# Patient Record
Sex: Female | Born: 1935
Health system: Southern US, Community
[De-identification: ages and names within clinical notes are randomized; demographics above are authoritative.]

## PROBLEM LIST (undated history)

## (undated) DIAGNOSIS — C50919 Malignant neoplasm of unspecified site of unspecified female breast: Secondary | ICD-10-CM

## (undated) DIAGNOSIS — K5730 Diverticulosis of large intestine without perforation or abscess without bleeding: Secondary | ICD-10-CM

## (undated) DIAGNOSIS — F419 Anxiety disorder, unspecified: Secondary | ICD-10-CM

## (undated) DIAGNOSIS — R112 Nausea with vomiting, unspecified: Secondary | ICD-10-CM

## (undated) DIAGNOSIS — B029 Zoster without complications: Secondary | ICD-10-CM

## (undated) DIAGNOSIS — I809 Phlebitis and thrombophlebitis of unspecified site: Secondary | ICD-10-CM

## (undated) DIAGNOSIS — E079 Disorder of thyroid, unspecified: Secondary | ICD-10-CM

## (undated) DIAGNOSIS — Z9889 Other specified postprocedural states: Secondary | ICD-10-CM

## (undated) DIAGNOSIS — M8580 Other specified disorders of bone density and structure, unspecified site: Secondary | ICD-10-CM

## (undated) DIAGNOSIS — G4700 Insomnia, unspecified: Secondary | ICD-10-CM

## (undated) DIAGNOSIS — C50111 Malignant neoplasm of central portion of right female breast: Secondary | ICD-10-CM

## (undated) DIAGNOSIS — K589 Irritable bowel syndrome without diarrhea: Secondary | ICD-10-CM

## (undated) DIAGNOSIS — E559 Vitamin D deficiency, unspecified: Secondary | ICD-10-CM

## (undated) DIAGNOSIS — C679 Malignant neoplasm of bladder, unspecified: Secondary | ICD-10-CM

## (undated) DIAGNOSIS — A4901 Methicillin susceptible Staphylococcus aureus infection, unspecified site: Secondary | ICD-10-CM

## (undated) DIAGNOSIS — M1990 Unspecified osteoarthritis, unspecified site: Secondary | ICD-10-CM

## (undated) DIAGNOSIS — I2510 Atherosclerotic heart disease of native coronary artery without angina pectoris: Secondary | ICD-10-CM

## (undated) DIAGNOSIS — E785 Hyperlipidemia, unspecified: Secondary | ICD-10-CM

## (undated) DIAGNOSIS — K644 Residual hemorrhoidal skin tags: Secondary | ICD-10-CM

## (undated) DIAGNOSIS — K56699 Other intestinal obstruction unspecified as to partial versus complete obstruction: Secondary | ICD-10-CM

## (undated) DIAGNOSIS — C44721 Squamous cell carcinoma of skin of unspecified lower limb, including hip: Secondary | ICD-10-CM

## (undated) DIAGNOSIS — K5900 Constipation, unspecified: Secondary | ICD-10-CM

## (undated) HISTORY — DX: Insomnia, unspecified: G47.00

## (undated) HISTORY — DX: Phlebitis and thrombophlebitis of unspecified site: I80.9

## (undated) HISTORY — DX: Diverticulosis of large intestine without perforation or abscess without bleeding: K57.30

## (undated) HISTORY — DX: Irritable bowel syndrome, unspecified: K58.9

## (undated) HISTORY — PX: TONSILLECTOMY: SUR1361

## (undated) HISTORY — DX: Malignant neoplasm of central portion of right female breast: C50.111

## (undated) HISTORY — DX: Other specified disorders of bone density and structure, unspecified site: M85.80

## (undated) HISTORY — PX: KNEE SURGERY: SHX244

## (undated) HISTORY — DX: Zoster without complications: B02.9

## (undated) HISTORY — PX: KNEE ARTHROSCOPY: SUR90

## (undated) HISTORY — DX: Vitamin D deficiency, unspecified: E55.9

## (undated) HISTORY — DX: Squamous cell carcinoma of skin of unspecified lower limb, including hip: C44.721

## (undated) HISTORY — DX: Methicillin susceptible Staphylococcus aureus infection, unspecified site: A49.01

## (undated) HISTORY — DX: Hyperlipidemia, unspecified: E78.5

## (undated) HISTORY — PX: OTHER SURGICAL HISTORY: SHX169

## (undated) HISTORY — DX: Other intestinal obstruction unspecified as to partial versus complete obstruction: K56.699

## (undated) HISTORY — DX: Malignant neoplasm of bladder, unspecified: C67.9

## (undated) HISTORY — DX: Residual hemorrhoidal skin tags: K64.4

## (undated) HISTORY — PX: HEMORRHOID SURGERY: SHX153

## (undated) HISTORY — DX: Anxiety disorder, unspecified: F41.9

## (undated) HISTORY — DX: Constipation, unspecified: K59.00

## (undated) HISTORY — DX: Disorder of thyroid, unspecified: E07.9

## (undated) HISTORY — DX: Malignant neoplasm of unspecified site of unspecified female breast: C50.919

## (undated) HISTORY — DX: Unspecified osteoarthritis, unspecified site: M19.90

---

## 1985-12-11 ENCOUNTER — Encounter: Payer: Self-pay | Admitting: Gastroenterology

## 1997-12-13 ENCOUNTER — Other Ambulatory Visit: Admission: RE | Admit: 1997-12-13 | Discharge: 1997-12-13 | Payer: Self-pay | Admitting: Obstetrics and Gynecology

## 1998-03-21 ENCOUNTER — Other Ambulatory Visit: Admission: RE | Admit: 1998-03-21 | Discharge: 1998-03-21 | Payer: Self-pay | Admitting: Dermatology

## 1998-04-01 ENCOUNTER — Other Ambulatory Visit: Admission: RE | Admit: 1998-04-01 | Discharge: 1998-04-01 | Payer: Self-pay | Admitting: Dermatology

## 1998-12-21 ENCOUNTER — Other Ambulatory Visit: Admission: RE | Admit: 1998-12-21 | Discharge: 1998-12-21 | Payer: Self-pay | Admitting: Obstetrics and Gynecology

## 1999-12-25 ENCOUNTER — Other Ambulatory Visit: Admission: RE | Admit: 1999-12-25 | Discharge: 1999-12-25 | Payer: Self-pay | Admitting: Obstetrics and Gynecology

## 2000-06-10 HISTORY — PX: TOTAL KNEE ARTHROPLASTY: SHX125

## 2000-06-12 ENCOUNTER — Encounter: Payer: Self-pay | Admitting: Orthopedic Surgery

## 2000-06-19 ENCOUNTER — Inpatient Hospital Stay (HOSPITAL_COMMUNITY): Admission: RE | Admit: 2000-06-19 | Discharge: 2000-06-24 | Payer: Self-pay | Admitting: Orthopedic Surgery

## 2000-06-19 ENCOUNTER — Encounter: Payer: Self-pay | Admitting: Orthopedic Surgery

## 2001-01-22 ENCOUNTER — Other Ambulatory Visit: Admission: RE | Admit: 2001-01-22 | Discharge: 2001-01-22 | Payer: Self-pay | Admitting: Obstetrics and Gynecology

## 2002-01-28 ENCOUNTER — Other Ambulatory Visit: Admission: RE | Admit: 2002-01-28 | Discharge: 2002-01-28 | Payer: Self-pay | Admitting: Obstetrics and Gynecology

## 2002-12-31 ENCOUNTER — Encounter: Admission: RE | Admit: 2002-12-31 | Discharge: 2002-12-31 | Payer: Self-pay | Admitting: *Deleted

## 2002-12-31 ENCOUNTER — Encounter: Payer: Self-pay | Admitting: *Deleted

## 2003-02-16 ENCOUNTER — Other Ambulatory Visit: Admission: RE | Admit: 2003-02-16 | Discharge: 2003-02-16 | Payer: Self-pay | Admitting: Obstetrics and Gynecology

## 2004-02-21 ENCOUNTER — Other Ambulatory Visit: Admission: RE | Admit: 2004-02-21 | Discharge: 2004-02-21 | Payer: Self-pay | Admitting: Obstetrics and Gynecology

## 2004-06-20 ENCOUNTER — Inpatient Hospital Stay (HOSPITAL_COMMUNITY): Admission: RE | Admit: 2004-06-20 | Discharge: 2004-06-23 | Payer: Self-pay | Admitting: Orthopedic Surgery

## 2005-04-02 ENCOUNTER — Other Ambulatory Visit: Admission: RE | Admit: 2005-04-02 | Discharge: 2005-04-02 | Payer: Self-pay | Admitting: Obstetrics and Gynecology

## 2006-04-30 ENCOUNTER — Other Ambulatory Visit: Admission: RE | Admit: 2006-04-30 | Discharge: 2006-04-30 | Payer: Self-pay | Admitting: Obstetrics and Gynecology

## 2006-08-07 ENCOUNTER — Ambulatory Visit: Payer: Self-pay | Admitting: Gastroenterology

## 2006-09-26 ENCOUNTER — Ambulatory Visit: Payer: Self-pay | Admitting: Gastroenterology

## 2007-05-22 ENCOUNTER — Encounter: Admission: RE | Admit: 2007-05-22 | Discharge: 2007-05-22 | Payer: Self-pay | Admitting: Internal Medicine

## 2007-08-05 ENCOUNTER — Other Ambulatory Visit: Admission: RE | Admit: 2007-08-05 | Discharge: 2007-08-05 | Payer: Self-pay | Admitting: Obstetrics and Gynecology

## 2008-07-27 ENCOUNTER — Ambulatory Visit: Payer: Self-pay | Admitting: Gastroenterology

## 2008-07-27 DIAGNOSIS — Z860100 Personal history of colon polyps, unspecified: Secondary | ICD-10-CM | POA: Insufficient documentation

## 2008-07-27 DIAGNOSIS — K589 Irritable bowel syndrome without diarrhea: Secondary | ICD-10-CM | POA: Insufficient documentation

## 2008-07-27 LAB — CONVERTED CEMR LAB
BUN: 21 mg/dL (ref 6–23)
Basophils Absolute: 0 10*3/uL (ref 0.0–0.1)
Basophils Relative: 0.6 % (ref 0.0–3.0)
Calcium: 8.8 mg/dL (ref 8.4–10.5)
Chloride: 107 meq/L (ref 96–112)
Creatinine, Ser: 0.8 mg/dL (ref 0.4–1.2)
Eosinophils Absolute: 0.2 10*3/uL (ref 0.0–0.7)
Eosinophils Relative: 2.3 % (ref 0.0–5.0)
GFR calc Af Amer: 91 mL/min
GFR calc non Af Amer: 75 mL/min
HCT: 38.1 % (ref 36.0–46.0)
MCHC: 34.8 g/dL (ref 30.0–36.0)
MCV: 95.7 fL (ref 78.0–100.0)
Monocytes Absolute: 0.6 10*3/uL (ref 0.1–1.0)
Neutrophils Relative %: 61 % (ref 43.0–77.0)
RBC: 3.98 M/uL (ref 3.87–5.11)
TSH: 1.52 microintl units/mL (ref 0.35–5.50)
WBC: 6.9 10*3/uL (ref 4.5–10.5)

## 2008-08-24 ENCOUNTER — Ambulatory Visit: Payer: Self-pay | Admitting: Gastroenterology

## 2008-10-11 DIAGNOSIS — I2510 Atherosclerotic heart disease of native coronary artery without angina pectoris: Secondary | ICD-10-CM

## 2008-10-11 HISTORY — DX: Atherosclerotic heart disease of native coronary artery without angina pectoris: I25.10

## 2008-10-11 HISTORY — PX: CORONARY ANGIOPLASTY WITH STENT PLACEMENT: SHX49

## 2008-10-13 ENCOUNTER — Inpatient Hospital Stay (HOSPITAL_COMMUNITY): Admission: AD | Admit: 2008-10-13 | Discharge: 2008-10-14 | Payer: Self-pay | Admitting: Interventional Cardiology

## 2008-10-13 ENCOUNTER — Inpatient Hospital Stay (HOSPITAL_BASED_OUTPATIENT_CLINIC_OR_DEPARTMENT_OTHER): Admission: RE | Admit: 2008-10-13 | Discharge: 2008-10-13 | Payer: Self-pay | Admitting: Cardiology

## 2009-09-10 DIAGNOSIS — A4901 Methicillin susceptible Staphylococcus aureus infection, unspecified site: Secondary | ICD-10-CM

## 2009-09-10 HISTORY — DX: Methicillin susceptible Staphylococcus aureus infection, unspecified site: A49.01

## 2010-03-22 ENCOUNTER — Telehealth: Payer: Self-pay | Admitting: Gastroenterology

## 2010-06-23 ENCOUNTER — Encounter: Admission: RE | Admit: 2010-06-23 | Discharge: 2010-06-23 | Payer: Self-pay | Admitting: Internal Medicine

## 2010-09-10 DIAGNOSIS — C679 Malignant neoplasm of bladder, unspecified: Secondary | ICD-10-CM

## 2010-09-10 DIAGNOSIS — C44721 Squamous cell carcinoma of skin of unspecified lower limb, including hip: Secondary | ICD-10-CM

## 2010-09-10 HISTORY — DX: Malignant neoplasm of bladder, unspecified: C67.9

## 2010-09-10 HISTORY — PX: OTHER SURGICAL HISTORY: SHX169

## 2010-09-10 HISTORY — DX: Squamous cell carcinoma of skin of unspecified lower limb, including hip: C44.721

## 2010-10-10 NOTE — Progress Notes (Signed)
Summary: Schedule Office Visit  Phone Note Outgoing Call Call back at [facility] Phone [phone]   Call placed by: Harlow Mares CMA Duncan Dull),  March 22, 2010 4:53 PM Call placed to: Patient Summary of Call: No Answer, patient is due for an office visit  Initial call taken by: Harlow Mares CMA Duncan Dull),  March 22, 2010 4:54 PM  Follow-up for Phone Call        patient is doing great and will call back if needed.  Follow-up by: Harlow Mares CMA Duncan Dull),  March 29, 2010 4:47 PM

## 2010-10-19 ENCOUNTER — Other Ambulatory Visit: Payer: Self-pay | Admitting: Dermatology

## 2010-10-21 ENCOUNTER — Emergency Department (HOSPITAL_COMMUNITY)
Admission: EM | Admit: 2010-10-21 | Discharge: 2010-10-21 | Disposition: A | Discharge status: Home / Self Care | Payer: Medicare Other | Attending: Emergency Medicine | Admitting: Emergency Medicine

## 2010-10-21 DIAGNOSIS — I2510 Atherosclerotic heart disease of native coronary artery without angina pectoris: Secondary | ICD-10-CM | POA: Insufficient documentation

## 2010-10-21 DIAGNOSIS — R0609 Other forms of dyspnea: Secondary | ICD-10-CM | POA: Insufficient documentation

## 2010-10-21 DIAGNOSIS — E039 Hypothyroidism, unspecified: Secondary | ICD-10-CM | POA: Insufficient documentation

## 2010-10-21 DIAGNOSIS — R0602 Shortness of breath: Secondary | ICD-10-CM

## 2010-10-21 DIAGNOSIS — Z79899 Other long term (current) drug therapy: Secondary | ICD-10-CM | POA: Insufficient documentation

## 2010-10-21 DIAGNOSIS — R0989 Other specified symptoms and signs involving the circulatory and respiratory systems: Secondary | ICD-10-CM | POA: Insufficient documentation

## 2010-10-21 DIAGNOSIS — R079 Chest pain, unspecified: Secondary | ICD-10-CM | POA: Insufficient documentation

## 2010-10-21 LAB — COMPREHENSIVE METABOLIC PANEL
AST: 25 U/L (ref 0–37)
CO2: 25 mEq/L (ref 19–32)
Calcium: 9 mg/dL (ref 8.4–10.5)
Chloride: 104 mEq/L (ref 96–112)
Creatinine, Ser: 0.69 mg/dL (ref 0.4–1.2)
GFR calc Af Amer: >60 mL/min (ref >60)
GFR calc non Af Amer: >60 mL/min (ref >60)
Glucose, Bld: 99 mg/dL (ref 70–99)
Total Bilirubin: 0.6 mg/dL (ref 0.3–1.2)

## 2010-10-21 LAB — POCT CARDIAC MARKERS: Myoglobin, poc: 58 ng/mL (ref 12–200)

## 2010-10-21 LAB — CBC
Hemoglobin: 13.4 g/dL (ref 12.0–15.0)
MCH: 32.4 pg (ref 26.0–34.0)
MCHC: 34.3 g/dL (ref 30.0–36.0)
Platelets: 190 10*3/uL (ref 150–400)
RDW: 12.8 % (ref 11.5–15.5)

## 2010-11-20 ENCOUNTER — Other Ambulatory Visit: Payer: Self-pay | Admitting: Urology

## 2010-11-20 ENCOUNTER — Ambulatory Visit (HOSPITAL_BASED_OUTPATIENT_CLINIC_OR_DEPARTMENT_OTHER)
Admission: RE | Admit: 2010-11-20 | Discharge: 2010-11-21 | Disposition: A | Discharge status: Home / Self Care | Payer: Medicare Other | Source: Ambulatory Visit | Attending: Urology | Admitting: Urology

## 2010-11-20 ENCOUNTER — Ambulatory Visit (HOSPITAL_COMMUNITY): Payer: Medicare Other | Attending: Urology

## 2010-11-20 DIAGNOSIS — I2510 Atherosclerotic heart disease of native coronary artery without angina pectoris: Secondary | ICD-10-CM | POA: Insufficient documentation

## 2010-11-20 DIAGNOSIS — Z01818 Encounter for other preprocedural examination: Secondary | ICD-10-CM | POA: Insufficient documentation

## 2010-11-20 DIAGNOSIS — Q619 Cystic kidney disease, unspecified: Secondary | ICD-10-CM | POA: Insufficient documentation

## 2010-11-20 DIAGNOSIS — D494 Neoplasm of unspecified behavior of bladder: Secondary | ICD-10-CM | POA: Insufficient documentation

## 2010-11-20 DIAGNOSIS — Z79899 Other long term (current) drug therapy: Secondary | ICD-10-CM | POA: Insufficient documentation

## 2010-11-20 DIAGNOSIS — Z7902 Long term (current) use of antithrombotics/antiplatelets: Secondary | ICD-10-CM | POA: Insufficient documentation

## 2010-11-20 DIAGNOSIS — I359 Nonrheumatic aortic valve disorder, unspecified: Secondary | ICD-10-CM | POA: Insufficient documentation

## 2010-11-20 DIAGNOSIS — Z87891 Personal history of nicotine dependence: Secondary | ICD-10-CM | POA: Insufficient documentation

## 2010-11-20 DIAGNOSIS — E039 Hypothyroidism, unspecified: Secondary | ICD-10-CM | POA: Insufficient documentation

## 2010-11-20 DIAGNOSIS — N35919 Unspecified urethral stricture, male, unspecified site: Secondary | ICD-10-CM | POA: Insufficient documentation

## 2010-11-20 DIAGNOSIS — E785 Hyperlipidemia, unspecified: Secondary | ICD-10-CM | POA: Insufficient documentation

## 2010-11-20 LAB — POCT I-STAT 4, (NA,K, GLUC, HGB,HCT)
Glucose, Bld: 106 mg/dL — ABNORMAL HIGH (ref 70–99)
HCT: 40 % (ref 36.0–46.0)
Potassium: 4.2 mEq/L (ref 3.5–5.1)

## 2010-11-22 NOTE — Op Note (Signed)
NAMENATTIE, LAZENBY NO.:  [id number]  MEDICAL RECORD NO.:  [id number]           PATIENT TYPE:  E  LOCATION:  [facility]                         FACILITY:  [facility]  PHYSICIAN:  Bertram Millard. Shaely Gadberry, M.D.DATE OF BIRTH:  [birth year]  DATE OF PROCEDURE:  11/20/2010 DATE OF DISCHARGE:  10/21/2010                              OPERATIVE REPORT   PREOPERATIVE DIAGNOSIS:  A 2.8-cm bladder tumor.  POSTOPERATIVE DIAGNOSES:  A 2.8-cm bladder tumor.  SURGICAL PROCEDURES:  Transurethral resection of the bladder tumor, 2.8- cm bladder tumor.  SURGEON:  Bertram Millard. Falyn Rubel, M.D.  ANESTHESIA:  General with LMA.  COMPLICATIONS:  None.  SPECIMENS: 1. Bladder tumor. 2. Bladder tumor base.  BRIEF HISTORY:  This very nice 76 year old female presents at this time for definitive surgical management of bladder tumor.  She presented on September 25, 2010, with gross hematuria.  This was fairly self limited. She does have a remote history of cigarette smoking.  She was also on anticoagulation.  Evaluation included CT of the abdomen and pelvis and cystoscopy.  CT revealed a 2.8-cm bladder tumor as well as bilateral renal cysts and a 4.8-cm left upper pole lesion which was a Bosniak category 79F cyst (in other words, we need to follow this on a regular basis with imaging).  The patient presents at this time for TURBT after cystoscopy confirmed bladder tumor.  Risks and complications of the procedure have been discussed with the patient and her husband.  They understand these and desire to proceed.  DESCRIPTION OF PROCEDURE:  The patient was identified in the holding area and received preoperative IV antibiotics.  She was taken to the operating room where general anesthetic was administered using the LMA. She was placed in the dorsal lithotomy position.  Genitalia and perineum were prepped and draped.  Time-out was then performed.  The procedure then commenced.  A 28-French  resectoscope sheath was passed into the bladder with the obturator.  There was mild meatal stenosis.  However, the scope easily passed.  The resectoscope element was then placed.  Thorough evaluation of the bladder was then performed. She had 2 small satellite lesions near one large bladder tumor that was lateral and superior to the right ureteral orifice.  The orifice was not involved in any of this process.  I then used the cutting loop to resect the large bladder tumor down to its base.  The muscular tissue was obtained with this, according to my estimate.  Once all the bladder tumor had been resected down to muscular layer, I then used the cold cup forceps to take three separate biopsies of the base of this.  These were sent separate from the main body of the bladder tumor, labeled "bladder tumor base."  Following this, the entire base of the resected area was cauterized.  I then included the main bladder tumor, the 2 satellite lesions which were resected separately.  Again, the base of these areas was cauterized.  No bleeding was seen at this point and the irrigant which came out of the bladder was clear.  I then placed a 20-French Foley catheter.  The specimens were sent separately as "bladder tumor" and "bladder tumor base."  At this point, the patient was awakened and taken to PACU in stable condition after the catheter had been placed and her bladder drained.  I then, separately in the PACU, instilled 40 cc of saline which included 40 mg of mitomycin.  The catheter was plugged.  At this point, the patient was awake.     Bertram Millard. Brysyn Brandenberger, M.D.     SMD/MEDQ  D:  11/20/2010  T:  11/20/2010  Job:  [id number]  cc:   [name], M.D. Fax: [phone]  Armanda Magic, M.D. Fax: [phone]  Electronically Signed by Marcine Matar M.D. on 11/22/2010 10:31:59 AM

## 2010-12-09 NOTE — Consult Note (Signed)
[name], [name] NO.:  [id number]  MEDICAL RECORD NO.:  [id number]           PATIENT TYPE:  E  LOCATION:  [facility]                         FACILITY:  [facility]  PHYSICIAN:  Sarajane Marek, MD     DATE OF BIRTH:  1935-09-06  DATE OF CONSULTATION:  10/21/2010 DATE OF DISCHARGE:  10/21/2010                                CONSULTATION   PRIMARY CARDIOLOGIST:  Armanda Magic, MD  PRIMARY PHYSICIAN:  [name], MD  REQUESTING PHYSICIAN:  Emergency department.  REASON FOR CONSULTATION:  Shortness of breath.  HISTORY OF PRESENT ILLNESS:  [name] is a 76 year old female with history of coronary artery disease, status post PCI of the LAD in February 2010, who presents with shortness of breath and lightheadedness.  She reports that she has not had any exertional angina symptoms, heart failure symptoms, presyncope, syncope, shortness of breath or lightheadedness over the last 3-4 weeks.  She has remained quite active with traveling and has not had any symptoms to speak of. I also questioned her about prolonged periods of sitting during her travels and she reports that they do stop frequently to walk around and she has not had any lower extremity edema or pain.  Today, she noticed symptoms of shortness of breath and lightheadedness in association with significantly elevated blood pressure.  She notes that her blood pressure is normally in the 120s-130s/80s and she was unable to identify precipitating factors for elevated blood pressure.  In the emergency department, she did received sublingual nitroglycerin x3, and her initial blood pressure of 185/91 has now improved to 129/77.  All symptoms have resolved and the patient has discussed her desire to go home and follow up with Dr. Mayford Knife on Monday.  PAST MEDICAL HISTORY.: 1. Coronary artery disease, status post PCI of the LAD with a 3.5 x 15     mm Endeavor stent in February 2010. 2. Hypothyroid. 3. Anxiety. 4.  Insomnia. 5. IBS. 6. Vitamin D insufficiency.  ALLERGIES: 1. DROPERIDOL. 2. SULFA. 3. BENADRYL. 4. CORTISONE. 5. CRESTOR. 6. LIPITOR. 7. TRAMADOL.  CURRENT MEDICATIONS: 1. Folic acid 400 mcg daily. 2. Calcium 600 +D 400 international units tablet 2 tablets daily. 3. Citrucel powder as needed. 4. MiraLAX powder as needed. 5. Centrum cardio 1 tablet daily. 6. Ambien 5 mg nightly  p.r.n. 7. Plavix 75 mg daily.  Of note, the patient has discontinued x3 days. 8. Aspirin 81 mg daily. 9. Synthroid 50 mcg daily. 10.CoQ-10, 200 mg daily. 11.Pravastatin 40 mg daily. 12.Xanax 0.5 mg tablet one-half as needed.  SOCIAL HISTORY:  The patient lives with her husband in [place], has a 25-pack-year smoking history, but quit in 1986.  She drinks one glass of wine daily.  FAMILY HISTORY:  Significant for father deceased with MI at age 38.  REVIEW OF SYSTEMS:  Complete 14- point review of systems was performed and was negative except as noted in HPI.  PHYSICAL EXAMINATION:  VITAL SIGNS:  Temperature 98.3, heart rate 82, respiratory rate 20, blood pressure initially 185/91 and now 129/77, oxygen saturation is 99% on room air. GENERAL:  The patient is  in no acute distress. EYES:  Extraocular muscles intact.  Sclerae are clear. ENT:  Oropharynx is clear.  Nares are clear.  Mucous membranes are moist. NECK:  Supple without lymphadenopathy.  There is no thyromegaly, bruit, or JVD. CARDIOVASCULAR:  Heart:  Rate is regular with normal S1-S2.  No S3, S4. No murmur, gallop, or rub. LUNGS:  Clear bilaterally without increased work of breathing or tachypnea. ABDOMEN:  Soft and nontender with normal bowel sounds.  No rebound, guarding, or mass. EXTREMITIES:  No clubbing, cyanosis, or edema. MUSCULOSKELETAL:  She has arthritic changes of her hands, but otherwise no significant joint deformity. NEURO:  The patient is alert and oriented x3 with no focal deficits.  Chest x-ray was performed at  the office and unavailable for review at this time.  EKG demonstrates normal sinus rhythm at a rate of 72 with no ischemic ST or T changes and no change from prior.  LABORATORY DATA:  White blood cell count 6.9, hemoglobin 13.4, hematocrit 39, platelets 190.  Sodium 138, potassium 3.8, chloride 104, bicarb 25, BUN 22, creatinine 0.7, glucose 99, CK-MB is 2, troponin is less than 0.05.  LFTs are within normal limits.  ASSESSMENT: 1. Hypertensive urgency. 2. Dyspnea. 3. History of coronary artery disease.  PLAN:  The patient's blood pressure is now controlled with total resolution of symptoms.  She was unable to identify precipitating factors.  I did discuss an overnight observation in the hospital with serial EKGs and telemetry monitor as well as serial cardiac biomarkers,a check of her TSH and potentially a renal artery duplex to evaluate her hypertension.  I have also discussed the potential for low-dose hydrochlorothiazide as her last office blood pressure check was elevated to 148/80, however, the patient would prefer to discuss this further with Dr. Mayford Knife.  She prefers to be discharged home which we will do tonight and she will follow up with Dr. Mayford Knife on Monday.  She is advised to return to the emergency department with any chest pain, shortness of breath, presyncope, syncope, or nausea and vomiting.     Sarajane Marek, MD     HH/MEDQ  D:  10/21/2010  T:  10/22/2010  Job:  [id number]  Electronically Signed by Sarajane Marek MD on 12/09/2010 02:46:48 PM

## 2010-12-26 LAB — CBC
MCV: 94.8 fL (ref 78.0–100.0)
Platelets: 207 10*3/uL (ref 150–400)
RBC: 4.13 MIL/uL (ref 3.87–5.11)
WBC: 6.3 10*3/uL (ref 4.0–10.5)

## 2010-12-26 LAB — BASIC METABOLIC PANEL
CO2: 29 mEq/L (ref 19–32)
Calcium: 9.2 mg/dL (ref 8.4–10.5)
Chloride: 109 mEq/L (ref 96–112)
Creatinine, Ser: 0.71 mg/dL (ref 0.4–1.2)
GFR calc Af Amer: >60 mL/min (ref >60)
Sodium: 143 mEq/L (ref 135–145)

## 2011-01-23 NOTE — Cardiovascular Report (Signed)
[name], PFLUM NO.:  [id number]   MEDICAL RECORD NO.:  [id number]          PATIENT TYPE:  INP   LOCATION:  2507                         FACILITY:  [facility]   PHYSICIAN:  Corky Crafts, MDDATE OF BIRTH:  [birth year]   DATE OF PROCEDURE:  10/13/2008  DATE OF DISCHARGE:                            CARDIAC CATHETERIZATION   PROCEDURE PERFORMED:  PCI of the LAD.   OPERATOR:  Corky Crafts, M.D.   INDICATIONS:  Unstable angina, abnormal stress test.   REFERRING PHYSICIAN:  [name], M.D.   PROCEDURE NARRATIVE:  The diagnostic catheterization was done by Dr.  Mayford Knife and revealed a 95% lesion in the LAD.  She was brought to the  inpatient cath lab and her 4-French sheath was changed out for a 6-  [place] sheath.  An XB LAD guide was used to engage the left main.  A  Prowater wire was placed into the LAD across the lesion.  A 2.5 x 12  Apex balloon was placed across the lesion and inflated to 6 atmospheres  for 12 seconds and then again to 8 atmospheres for 16 seconds.  A 3.5 x  15-mm Endeavor stent was placed across the lesion and deployed at 12  atmospheres for 38 seconds.  The stent was postdilated with a 3.75 x 12  [place] balloon, first inflated to 16 atmospheres for 31 seconds and  then to 18 atmospheres for 16 seconds.  There is an excellent  angiographic result with no residual stenosis.  TIMI 3 flow was  maintained throughout.   IMPRESSION:  Successful DES to the LAD with a 3.5 x 15-mm Endeavor,  postdilated to 3.8 mm in diameter.   RECOMMENDATIONS:  Continue aspirin and Plavix for minimum of 1 year and  likely be on that.  Watch the patient overnight.  Assuming no  complications, we will plan on discharging her tomorrow.      Corky Crafts, MD  Electronically Signed     JSV/MEDQ  D:  10/13/2008  T:  10/13/2008  Job:  [id number]

## 2011-01-23 NOTE — Cardiovascular Report (Signed)
[name], [name] NO.:  [id number]   MEDICAL RECORD NO.:  [id number]           PATIENT TYPE:   LOCATION:                                 FACILITY:   PHYSICIAN:  Armanda Magic, M.D.     DATE OF BIRTH:  1934/10/06   DATE OF PROCEDURE:  10/13/2008  DATE OF DISCHARGE:                            CARDIAC CATHETERIZATION   PROCEDURES:  1. Left heart catheterization.  2. Coronary angiography.  3. Left ventriculography.   OPERATOR:  Armanda Magic, MD   INDICATIONS:  Chest pain and abnormal nuclear stress test.   COMPLICATIONS:  None.   INTRAVENOUS ACCESS:  Via right femoral artery using a 4-French sheath.   This is a 76 year old female who presented recently with episodes of  exertional shortness of breath and chest tightness.  She underwent  nuclear stress test, which showed ischemia in the anteroapical wall.  She now presents for cardiac catheterization.   The patient was brought to the cardiac catheterization laboratory in a  fasting nonsedated state.  Informed consent was obtained.  The patient  was connected to continuous heart rate and pulse oximetry monitoring and  intermittent blood pressure monitoring.  The right groin was prepped and  draped in the sterile fashion.  A 1% Xylocaine was used for local  anesthesia.  Using the modified Seldinger technique, a 4-French sheath  was placed in the right femoral artery.  Under fluoroscopic guidance, a  4-French JL4 catheter was placed into the left coronary artery.  Multiple cine films were taken in 30-degree RAO and 40-degree LAO views.  This catheter was then exchanged out over a guidewire for a 4-French  3DRCA catheter, which successfully engaged the right coronary ostium.  Multiple cine films were taken in 30-degree RAO and 40-degree LAO views.  This catheter was then exchanged out over a guidewire for a 4-French  angled pigtail catheter, which was placed under fluoroscopic guidance  into left ventricular  cavity.  Left ventriculography was performed in  the 30-degree RAO view using a total of 30 mL of contrast at 15 mL per  second.  The catheter was then pulled back across the aortic valve with  no significant gradient noted.  At the end of the procedure, all  catheters were removed.  The patient had a high-grade LAD lesion and  went on to immediate PCI of the LAD per Dr. Eldridge Dace.  Of note, during  the procedure, the patient became agitated with elevated heart rate up  in the 130 beats per minute with systolic blood pressure of 189 mmHg.  She also developed substernal chest pain.  She was given an additional 1  mg of IV Versed in addition to the 1 mg of Versed and 25 mcg of fentanyl  she had had at the beginning of the procedure.  She was also given 1  sublingual nitro spray and started on IV nitroglycerin drip at 10 mcg.  She was given 2.5 mg of IV Lopressor.   RESULTS:  1. Left main coronary artery is widely patent but very short and  immediately bifurcates into a left anterior descending artery and      left circumflex artery.  2. Left circumflex artery traverses the AV groove and is widely patent      throughout its course.  It gives rise to a large obtuse marginal      branch, which is widely patent.  3. The left anterior descending artery is patent in its the ostium.      It gives rise to a high diagonal 1 branch, which is widely patent      and then just distal to the takeoff of the diagonal, there is a      high-grade 99% stenosis of the proximal to mid LAD.  The ongoing      LAD traverses the apex and is widely patent.  4. The right coronary artery is widely patent throughout its course      and distally bifurcates into posterior descending artery and      posterolateral artery.  5. Left ventriculography shows normal LV function, EF of 60%, left      ventricular pressure of 183/24 mmHg, aortic pressure 174/93 mmHg.   ASSESSMENT:  1. Severe 1-vessel obstructive coronary  disease of the left anterior      descending.  2. Normal left ventricular function.  3. Chest pain and shortness of breath with positive nuclear stress      test.  4. Anteroapical ischemia.   PLAN:  PCI of the LAD by Dr. Eldridge Dace and 3000 units of IV heparin were  given at the end procedure.      Armanda Magic, M.D.  Electronically Signed     TT/MEDQ  D:  10/13/2008  T:  10/13/2008  Job:  [id number]

## 2011-01-26 NOTE — Discharge Summary (Signed)
[facility]  Patient:    [name], [name]                      MRN: [id number] Adm. Date:  [id number] Disc. Date: 06/24/00 Attending:  Skip Mayer Dictator:   Alexzandrew L. Julien Girt, P.A.-C. CC:         Al Decant. Janey Greaser, M.D.   Discharge Summary  ADMITTING DIAGNOSES: 1. End-stage osteoarthritis, left knee. 2. Hiatal hernia. 3. History of reflux. 4. Hypothyroidism. 5. History of colonic polyps.  DISCHARGE DIAGNOSES: 1. End-stage osteoarthritis, left knee, status post left total knee    replacement arthroplasty. 2. Postoperative hemorrhagic anemia. 3. Hiatal hernia. 4. Hypothyroidism. 5. History of colonic polyps.  PROCEDURE:  Patient was taken to the operating room on June 19, 2000, and underwent a left total knee replacement arthroplasty.  SURGEON:  Georges Lynch. Darrelyn Hillock, M.D.  ASSISTANT:  Illene Labrador. [place], M.D.  One Hemovac drain was placed at time of surgery.  CONSULTS:  None.  HISTORY OF PRESENT ILLNESS:  Patient is a 76 year old female well known to Dr. Ranee Gosselin having a history of end stage osteoarthritis.  She was seen and evaluated in the office and treated conservatively for a knee arthritis. However, she has had had progressive arthritis.  Patient was seen in the office where x-rays revealed severe collapse of the medial joint space.  It was felt she would require a total knee arthroplasty.  Risks and benefits of the procedure were discussed with the patient.  She had elected to proceed with surgery.  LABORATORY DATA:  CBC on admission showed a hemoglobin of 13.8, hematocrit 39.9, white cell count 7.6, red cell count 4.25.  Serum H&Hs were followed throughout the hospital course.  Postoperative hemoglobin and hematocrit dropped down to a level of 12.1 and 35.3.  Hemoglobin continued to drop down to a level of 8.9 and 25.8.  Last noted H&H was 8.8 with a hematocrit again of 25.3.  Patient was hemodynamically  stable at time of discharge.  Admission differential within normal limits.  PT, PTT on admission were 13.5 and 33 respectively with an INR of 1.1.  Serial pro times followed per Coumadin protocol.  Last noted PT-INR was 21.1 with an INR of 2.5 at time of discharge. Chem panel on admission all within normal limits.  Follow-up BMET on June 20, 2000, showed a slight drop in sodium from 139 to 133, elevation in glucose from 93 to 192.  Calcium dropped from 9.7 to 8.1.  Urinalysis on admission was negative.  Blood group type A positive.  X-rays:  Chest x-ray preoperatively dated June 12, 2000, no acute cardiopulmonary findings.  Left knee films preoperatively - advanced osteoarthritic degenerative changes, left knee.  Postoperative film on June 19, 2000 - anatomic alignment with postoperative changes noted.  EKG:  EKG report faxed on the chart dated May 07, 2000, appears to be normal sinus rhythm, normal EKG.  Confirmed report, however, unable to read signature.  HOSPITAL COURSE:  Patient admitted to [facility], taken to the operating room, and underwent the above-stated procedure without complication. Patient tolerated the procedure well, was later returned to the recovery room, then to the orthopedic floor with continued postoperative care.  Vital signs were followed on patient throughout the hospital course.  Patient had a slight postoperative temperature on postoperative days #2 and #3 of 100.7 and 100.9. Patients temperature did come back down.  She was afebrile the remainder of the hospital course.  One Hemovac drain placed at time of surgery was pulled on postoperative day #1.  Physical therapy and occupational therapy were consulted for postoperative knee protocol.  CPM was initiate don postoperative day #2.  The patient did very well with physical therapy.  She was ambulating approximately 50 to 55 feet by postoperative day #2, and she increased up to 200 feet  by postoperative day #4.  Wound care and wound observation was initiated on postoperative day #2.  Wound was healing well and had minimal swelling, expected postoperative swelling, however, no signs of infection. She was initially started on PCA analgesics.  However, she did well with her pain control and was weaned over to p.o. analgesics throughout the hospital course.  By June 24, 2000, patient was doing quite well, tolerating her p.o. medications, ambulating independently, and it was decided that patient would be discharged to home.  It was decided, due to a range of motion, that she would have a CPM machine delivered to her home and continue with this on an outpatient basis.  DISCHARGE PLAN:  Patient is discharged home on June 24, 2000.  DISCHARGE DIAGNOSES:  Please see above.  DISCHARGE MEDICATIONS: 1. Percocet p.r.n. pain. 2. Robaxin p.r.n. spasm. 3. Coumadin as per pharmacy protocol.  DISCHARGE DIET:  Diet as tolerated.  FOLLOW-UP:  Patient is to follow up in the office in two weeks from the date of surgery.  Please call the office for an appointment at [phone].  ACTIVITY:  Weightbearing as tolerated.  Home health physical therapy, home health nursing for continued total knee protocol on an outpatient basis.  She may start showering, home CPM, and full range of motion.  DISPOSITION:  Home.  CONDITION ON DISCHARGE:  Improved. DD:  06/24/00 TD:  06/24/00 Job: [id number]

## 2011-01-26 NOTE — Op Note (Signed)
[place]. [facility]  Patient:    RORIE, DELMORE                      MRN: [id number] Proc. Date: 06/19/00 Adm. Date:  [id number] Attending:  Skip Mayer CC:         Georges Lynch. Darrelyn Hillock, M.D.   Operative Report  PREOPERATIVE DIAGNOSIS:  Severe degenerative arthritis, left knee.  POSTOPERATIVE DIAGNOSIS:  Severe degenerative arthritis, left knee.  OPERATION PERFORMED:  Left total knee arthroplasty utilizing the [employer] system and I utilized the posterior cruciate sacrificing total knee.  The sizes used were as follows.  The femur was a size 7 left, posterior cruciate sacrificing type, the femoral tray was a size 5, the patella was a size 26, tibial insert was a 12 mm thickness size 5.  SURGEON:  Georges Lynch. Darrelyn Hillock, M.D.  ASSISTANT:  Illene Labrador. [place], M.D.  ANESTHESIA:  Spinal.  DESCRIPTION OF PROCEDURE:  Under spinal anesthesia, a routine orthopedic prep and drape of the left lower extremity was carried out.  The leg was exsanguinated with an Esmarch and the tourniquet was elevated to 350 mmHg. The patient had 1 gm of IV Ancef preop.  Decision was made on the anterior aspect of the left leg.  Bleeders were identified and cauterized.  At this time two flaps were created and reflected in the usual fashion.  I then carried out a median parapatellar approach.  I reflected the patella, flexed the knee and did medial and lateral meniscectomies.  At this time we removed all the spurs from the tibia, patella and femur.  I excised the anterior and posterior cruciate ligaments.  Following this, the initial drill hole was made in the intercondylar notch and a #1 jig was inserted, then I removed 12 mm thickness off of the distal femur.  Following this, I inserted a #2 jig for a size 7 left femur and carried out anterior, posterior and chamber cuts for size 7 left femur.  Once this was done, I then prepared the tibia in the usual fashion.  I removed  4 mm thickness off of the affected medial side of the tibia.  Then I did utilize my intramedullary guide rod at that time. Following that, I then inserted my next jig into the femur and carried out by patellar cut and my notch cuts.  I then inserted the size 7 left femoral component and my size 5 tibial tray with a 10 mm insert, then a 12 mm insert. The 12 mm insert fit quite nicely and was more stable.  We had good motion of the knee.  We were able to completely extend the knee and flex the knee. After this, I then removed the trial components and we prepared out patella. We removed 10 mm thickness from the articular surface of the patella and made three drill holes in the patella.  Following that, I flexed the knee and cut my keel cut out of the tibial metaphysis.  I thoroughly waterpicked the knee, dried the knee out and cemented all three components in simultaneously.  We also did a lateral release.  Prior to inserting the tibial insert, I waterpicked the knee out and made sure there were no loose pieces of cement. After this was done, I then went on and then dried the knee out and inserted my permanent 12 mm thickness tibial tray, size 5.  We had good motion of the knee.  We did lateral  release.  I inserted a Hemovac drain, thoroughly waterpicked the knee out again and closed the knee in layers in the usual fashion.  Sterile dressings were applied.  The patient left the operating room in satisfactory condition. DD:  06/19/00 TD:  06/21/00 Job: [id number]

## 2011-01-26 NOTE — Discharge Summary (Signed)
NAMEAGNIESZKA, NEWHOUSE NO.:  [id number]   MEDICAL RECORD NO.:  [id number]          PATIENT TYPE:  INP   LOCATION:  5038                         FACILITY:  [facility]   PHYSICIAN:  Dionne Ano. Gramig, M.D.DATE OF BIRTH:  01/23/1935   DATE OF ADMISSION:  06/20/2004  DATE OF DISCHARGE:  06/23/2004                                 DISCHARGE SUMMARY   ADMISSION DIAGNOSIS:  1.  Status post multiple attempted fixations of the left olecranon with      prominent hardware, arthrofibrosis, and chronic draining sinus tract      noted.  2.  History of hypothyroidism.  3.  History of constipation.  4.  History of hiatal hernia.   DISCHARGE DIAGNOSIS:  1.  Status post multiple attempted fixations of the left olecranon with      prominent hardware, arthrofibrosis, and chronic draining sinus tract      noted.  2.  History of hypothyroidism.  3.  History of constipation.  4.  History of hiatal hernia.   CONSULTATIONS:  None.   HISTORY OF PRESENT ILLNESS:  Ms. Bayne is a very pleasant 76 year old female  who sustained a fracture to her left olecranon in the distant past.  She was  seen and evaluated in [place] and underwent multiple attempted  fixations of the left olecranon.  Unfortunately, she developed a chronic  draining sinus tract, arthrofibrosis, and prominent hardware.  She was seen  in consultation per Dr. Dominica Severin, hand and upper extremity specialist,  where options for treatment were discussed.  At the minimal, the patient  would need removal of her hardware, I&D, and possible radial head  arthroplasty versus radial head excision.  This was discussed at length with  the patient preoperatively given her extensive injury and significant  arthrofibrosis, realistic expectations were discussed in terms of functional  range of motion.  The patient was noted to be on chronic antibiotics given  her draining sinus tract.  She had no obvious signs of osteomyelitic  preoperatively.   LABORATORY DATA:  Preoperative laboratory data showed her H&H was 13.7 and  39.2, WBC 6.2.  Complete metabolic panel was within normal limits.  Liver  enzymes were within normal limits.  Chest radiograph showed a normal study.  EKG showed normal sinus rhythm.   HOSPITAL COURSE:  The patient was admitted on June 20, 2004, and  underwent I&D of the left elbow with removal of hardware, triceps repair and  tenolysis, manipulation under anesthesia of the left elbow including the  distal radial ulnar joint and proximal radial ulnar joint, capsulotomy and  capsulectomy of the posterior left elbow.  In addition, she underwent ulnar  decompression and anterior transposition.  Please see dictated operative  report for full details.  She tolerated this very well and there were no  complications noted.  Interoperative specimens were obtained to rule out any  chronic infection.  Multiple specimens were obtained from the soft tissues  and bone.  The patient was admitted for standard postoperative care  including IV antibiotics, pain control, elevation, and therapy.   On  postoperative day one, she was doing quite well, she was received IV  Ancef without difficulty, she was in excellent spirits.  She denied any  numbness, tingling, fever, or chills.  She did have a fair amount of nausea  noted the previous night, but this had resolved.  Vital signs were stable.  She was afebrile.  Cultures were negative at that time.  In the left upper  extremity, her drain was removed without difficulty.  Sensation was intact.  She had minimal edema of the digits noted.  PT and OT was consulted to  encourage out of bed ambulation, non-weight bearing to the left upper  extremity.  Otherwise, no restrictions.  Postoperative day two, she was  doing well without complaints other than slight tenderness at the mid  humerus as expected.  The numbness and tingling had certainly improved.  She  denies any  nausea, vomiting, fever, or chills.  She was very eager for  discharge but certainly needed to continue observation, IV antibiotics.  Vital signs were stable, afebrile.  Cultures were negative.  The left upper  extremity examination showed her digits to be pink, warm, and dry.  No  neurovascular compromise.  No signs of compartment syndrome.  The wound was  clean, dry, and intact.  Range of motion was excellent.   On postoperative day three, the patient was doing very well.  She was  voiding without difficulty.  She had had a bowel movement with the addition  of mag citrate.  She was tolerating p.o. well.  She denied any nausea,  vomiting, fever, chills, shortness of breath.  Her vital signs were stable,  she was afebrile.  Culture tissue showed a few Staph species, otherwise,  nonspecific.  Bone cultures were negative.  Examination of the left upper  extremity was unchanged.  Her dressing was clean, dry, and intact.  Range of  motion intact.  Sensation intact.   DISPOSITION:  The patient will be discharged home in stable condition.   CONDITION ON DISCHARGE:  Good.   DISCHARGE INSTRUCTIONS:  Diet regular.  Activities:  She will keep her  splint clean, dry, and intact.  She will elevate and move her fingers  frequently. She will follow up with Dr. Amanda Pea in 8-10 days.  She will call  [phone] for appointment, questions, or concerns.   DISCHARGE MEDICATIONS:  Dilaudid, #40, 1-2 p.o. q.4-6h. p.r.n. pain,  Robaxin, #40, 1 p.o. q.4-6h. p.r.n. pain, Peri-Colace, #30, 1 p.o. b.i.d.  She will also take over the counter mag citrate, Keflex 500 mg 1 p.o. q.i.d.  x 1 week until final sensitiviti4es are obtained at which point in time she  will be placed on the appropriate antibiotic regimen as this was not  specific at discharge.       BB/MEDQ  D:  08/15/2004  T:  08/15/2004  Job:  [id number]

## 2011-01-26 NOTE — Discharge Summary (Signed)
[name], [name]                 ACCOUNT NO.:  [id number]   MEDICAL RECORD NO.:  [id number]          PATIENT TYPE:  INP   LOCATION:  2507                         FACILITY:  [facility]   PHYSICIAN:  [name], M.D.     DATE OF BIRTH:  1934/11/30   DATE OF ADMISSION:  10/13/2008  DATE OF DISCHARGE:  10/14/2008                               DISCHARGE SUMMARY   DISCHARGE DIAGNOSES:  1. Chest pain, status post percutaneous coronary intervention of the      left anterior descending.  2. Abnormal nuclear stress test.  3. Normal ejection fraction.  4. Hypothyroidism.  5. Anxiety.  6. Insomnia.  7. Vitamin D insufficiency.  8. Osteopenia.  9. Irritable bowel syndrome.   HOSPITAL COURSE:  [name] has a history of having chest pain.  She  underwent a Cardiolite study that was abnormal and this led to cardiac  catheterization on October 13, 2008.  Catheterization showed severe 1-  vessel obstructive coronary artery disease of the left anterior  descending artery.  Dr. Eldridge Dace was then called in to intervene and he  implanted a drug-eluting stent to the LAD without problem.  He  recommended that the patient remain on aspirin and Plavix for a minimum  1 year.   The patient was watched in the hospital overnight and was discharged to  home the following day in stable condition.  Lab work that day showed  hemoglobin of 13.5, hematocrit 39.2, white count 6.3, and platelets 207.  Sodium 143, potassium 4.0, glucose 103, BUN 13, and creatinine 0.71.   DISCHARGE MEDICATIONS:  1. Plavix 75 mg 1 p.o. daily.  2. Enteric-coated aspirin 325 mg a day.  3. Lipitor 20 mg a day.  4. Synthroid 50 mcg daily.  5. Omega-3 fatty acids 1000 mg 3 tablets daily.  6. Vitamin D 1000 units daily.  7. Folic acid 400 mcg 1 tablet daily.  8. Calcium/D 1 tablet twice a day.  9. Xanax 0.5 mg 1 tablet b.i.d. p.r.n.  10.Ambien 10 mg 1/2 tablet at bedtime p.r.n.  11.Zantac over the counter as needed for  indigestion.   The patient is discharged to home in stable, but improved condition.  The patient is to remain on a low-sodium heart-healthy diet.  Increase  activity slowly.  May shower and bathe.  No lifting for 1 week.  No  driving for 1 day.  Return to see Dr. Mayford Knife in 2 weeks.  She is to call  to schedule this appointment for 2 weeks followup.       Guy Franco, P.A.      [name], M.D.  Electronically Signed    LB/MEDQ  D:  11/25/2008  T:  11/26/2008  Job:  [id number]   cc:   [name], M.D.

## 2011-01-26 NOTE — Op Note (Signed)
NAMESHEVAUN, [name] NO.:  [id number]   MEDICAL RECORD NO.:  [id number]          PATIENT TYPE:  OIB   LOCATION:  2899                         FACILITY:  [facility]   PHYSICIAN:  [name], M.D.DATE OF BIRTH:  03-21-1935   DATE OF PROCEDURE:  06/20/2004  DATE OF DISCHARGE:                                 OPERATIVE REPORT   PREOPERATIVE DIAGNOSIS:  Status post multiple attempted fixations of the  left olecranon with subsequent wound dehiscence, exposed hardware, loss of  motion, arthrofibrosis and poor function.  Rule out infectious process.   POSTOPERATIVE DIAGNOSIS:  Status post multiple attempted fixations of the  left olecranon with subsequent wound dehiscence, exposed hardware, loss of  motion, arthrofibrosis and poor function.  Rule out infectious process.   PROCEDURES:  1.  I&D, left elbow skin, subcutaneous tissue, bone, tendon and muscle      tissue.  2.  Hardware removal, left elbow, in the form of an AccuMed plate and screw      construct.  3.  Triceps repair and tenolysis which was extensive in nature about the      left elbow.  4.  Stress radiography, left upper extremity.  5.  Manipulation under anesthesia, left elbow, including the distal      radioulnar joint and proximal radioulnar joint about the wrist and      elbow.  6.  Capsulotomy with capsulectomy, posterior left elbow, secondary to      arthrofibrosis.  7.  Ulnar nerve decompression and anterior transposition, left elbow.   SURGEON:  [name]. [name], M.D.   ASSISTANT:  Karie Chimera, P.A.-C.   COMPLICATIONS:  None.   ANESTHESIA:  General.   DRAINS:  Two.   TOURNIQUET TIME:  Less than 2 hours.   INDICATION FOR THE PROCEDURE:  This patient is a 76 year old female who  presents with the above-mentioned diagnosis.  I have counseled in regards to  the risks and benefits of surgery including the risks of infection,  bleeding, anesthesia, damage to normal structures and  failure of the surgery  to accomplish its intended goals of relieving symptoms and restoring  function.  With this in mind, she desires to proceed.  I have discussed with  her all treatment possibilities and necessary interventions that may be  employed.  I have discussed with her possible radial head revision due to  the fact that this is a silicone radial head arthroplasty.  I have discussed  with her the risks and benefits of surgery, etc.  I have discussed with her  that this is a salvage-type situation and we are going to try to afford her  the best elbow possible.  However, she should understand that there is  incongruity at her elbow joint as a baseline and arthritic change is  certainly somewhat inevitable in my opinion.   OPERATIVE PROCEDURE IN DETAIL:  The patient was seen by myself and  anesthesia.  She was counseled in the preop holding area and following  counseling in the holding area, she was then taken back to the operating  suite, underwent a smooth induction of general anesthesia under the  direction of Dr. Germaine Pomfret.  Following this, she was given  prophylactic antibiotics and underwent a careful prep and drape with  Betadine scrub and paint.  Following this, a sterile field was isolated.  Once this was done, the patient had the operation commence with insufflation  of the tourniquet to 250 mmHg.  I excised a portion of skin, subcu and  portion of the tendon where a sinus tract was present about the posterior  elbow.  This removal of skin, subcu and the sinus tract was taken down to  the plate, which was exposed.  I then opened up the wound and performed a  I&D of skin, subcutaneous tissue, bone and tendon.  We took culture of soft  tissue as well as bony cultures.  During this process, we performed hardware  removal of an AccuMed plate and screw construct.  Following hardware removal  including the AccuMed plate and tension band wire previously placed, I  then  performed an I&D of the elbow, aggressively removing any nonviable tissue;  this included skin, subcutaneous tissue, bone and tendon; greater than 3 L  of pulsatile lavage were placed in the elbow.  Following this, a new drape  was placed and the patient then underwent stress radiography revealing the  incongruity about the joint, however, there was no gross movement or loss or  motion at the bone interface where she was previously placed in an attempt  to have bone-on-bone healing.  I felt that there were some elements of bony  and fibrous union present and due to the fact that she had a incongruent  joint and a very tenuous elbow in terms of her olecranon and proximal ulna,  I felt it best to leave this, as it was a stable construct.  I then  performed a triceps tenolysis; this was carefully done by mobilizing the  triceps tendon distally and proximally.  Prior to doing so, I performed an  ulnar nerve release and anterior transposition; this was done by identifying  the ulnar nerve proximally, releasing the arcade of Struthers, medial  intermuscular septum was identified and excised; the FCU, cubital tunnel and  Osborne's ligament were also released.  The ulnar nerve was transposed in an  anterior state, tension-free, without difficulty and the cubital tunnel was  then sutured closed to prevent any subluxation posteriorly.  Given the fact  that this patient may ultimately need a total elbow arthroplasty in the  future, I wanted to transpose the nerve, which was highly hyperemic and  stuck down to scar tissue.  Also, given the fact that we are going to have  improved elbow flexion, I did not want ulnar nerve problems to develop  further.  Once the ulnar nerve was released and underwent an anterior  transposition and decompression, we then performed the triceps tenolysis  carefully; this was done by tenolysing the triceps tendon; this was done posteriorly in a very meticulous fashion.   There was a large amount of scar  tissue.  Following this, I then performed triceps repair at the conclusion  of the case, I should note.   In conjunction with the triceps repair and tenolysis, I performed a  capsulotomy posteriorly with capsulectomy and removal of a portion of the  fat pad.  The patient tolerated this well.  Once the capsulotomy and  capsulectomy as well as a triceps tenolysis were accomplished, we then  performed a  manipulation under anesthesia of the elbow and wrist.  The  patient was able to have a full extension nearly and was able to flex to  just over 90 degrees.  Pronation was to 60 degrees, supination to 65-70  degrees.  I was pleased with this and the findings.  Following this, we then  performed tourniquet deflation, followed by irrigation with 3 additional  liters of pulsatile lavage, thus 6 L were placed in the area.  Following  this, the triceps was repaired with 0 Vicryl posteriorly.  Once this was  done, the skin flaps which were previously mobilized in an extended fashion  between the fascia and subcu plane were then repaired.  I placed two eighth-  inch/medium Hemovac drains in the wound; this exited proximally about the  lateral aspect of the arm; I avoided the radial nerve, of course.  Once this  was done, the drains were placed accordingly and the patient underwent a  complex closure.  The wound closed nicely, tension-free, without difficulty;  I used 3-0 Vicryl followed by skin clips.  The patient tolerated this well;  there were no complicating features.  Once this was done, we then cleansed  the wound, placed Xeroform as well as Neosporin, Adaptic and a sterile  dressing followed by an anterior slab of plaster to keep the elbow in 30  degrees.  I should note the elbow sat tension-free without difficulty at the  conclusion of the case in terms of the flexion and extension capabilities  and skin tension.  We will admit her for IV antibiotics, pain  medicine,  cautious observation and hopefully she will not have any significant growth  in terms of a chronic infection in her elbow.  The condition actually look  quite well in terms of the draining sinus.  This does present significant  concerns to the overall state of affairs of her elbow, however, there was no  gross purulence and hopefully this will go on to heal without infectious  sequelae.   The patient and I did discuss postoperatively of course the intraoperative  findings.  Stress radiography during the case revealed that she was able to  extend to 10-15 degrees and flex to 90 degrees.  Unfortunately, she does  have a high degree of incongruity at her joint, however, given the bony and  fibrous union present, I felt that it was best not to take this down due to  the osteopenia and complexity of her problems.  I feel it would be best to  get as much motion as possible and to proceed with elbow arthroplasty, should degenerative changes ensue or chronic pain develop; this of course  was predicated on having a stable wound without infection.  We have  discussed all issues with the family.  She tolerated the procedure well.  There were no complicating features.  We will monitor her condition closely  and she will continue her admit status in the [facility].  I have discussed all issues with the family.       WMG/MEDQ  D:  06/20/2004  T:  06/21/2004  Job:  [id number]

## 2011-04-13 ENCOUNTER — Other Ambulatory Visit: Payer: Self-pay | Admitting: Dermatology

## 2011-05-10 ENCOUNTER — Other Ambulatory Visit: Payer: Self-pay | Admitting: Dermatology

## 2011-09-11 DIAGNOSIS — K56699 Other intestinal obstruction unspecified as to partial versus complete obstruction: Secondary | ICD-10-CM

## 2011-09-11 HISTORY — DX: Other intestinal obstruction unspecified as to partial versus complete obstruction: K56.699

## 2011-12-24 ENCOUNTER — Telehealth: Payer: Self-pay | Admitting: Gastroenterology

## 2011-12-24 NOTE — Telephone Encounter (Signed)
Pt is complaining of constipation that has caused her hemorrhoids to become painful.  She is using miralax and citrucel daily as well as sitz bath.  She is drinking plenty of water.  I scheduled appt for 01/02/12 845 am.  I told her she could use miralax twice daily and continue the sitz baths and tucks pads.  Pt agreed

## 2012-01-02 ENCOUNTER — Encounter: Payer: Self-pay | Admitting: Gastroenterology

## 2012-01-02 ENCOUNTER — Telehealth: Payer: Self-pay | Admitting: Gastroenterology

## 2012-01-02 ENCOUNTER — Ambulatory Visit (INDEPENDENT_AMBULATORY_CARE_PROVIDER_SITE_OTHER): Payer: BC Managed Care – HMO | Admitting: Gastroenterology

## 2012-01-02 DIAGNOSIS — K649 Unspecified hemorrhoids: Secondary | ICD-10-CM

## 2012-01-02 DIAGNOSIS — Z86010 Personal history of colonic polyps: Secondary | ICD-10-CM

## 2012-01-02 DIAGNOSIS — K5900 Constipation, unspecified: Secondary | ICD-10-CM

## 2012-01-02 MED ORDER — HYDROCORTISONE ACE-PRAMOXINE 1-1 % RE CREA: TOPICAL_CREAM | Freq: Two times a day (BID) | RECTAL | Status: AC | PRN

## 2012-01-02 MED ORDER — LINACLOTIDE 145 MCG PO CAPS: 1.0000 | ORAL_CAPSULE | Freq: Every day | ORAL | Status: DC

## 2012-01-02 MED ORDER — MOVIPREP 100 G PO SOLR: 1.0000 | ORAL | Status: DC

## 2012-01-02 NOTE — Telephone Encounter (Signed)
Pt questions whether or not she can start Linzess before her procedure.  I did advise her it was ok.  Pt thanked me for my help

## 2012-01-02 NOTE — Patient Instructions (Signed)
Stop the citrucel and miralax for now. Start once daily linzess pill. Apply topical ointment as needed for hemorrhoid discomfort. You will be set up for a colonoscopy for polyp surveillance.

## 2012-01-02 NOTE — Progress Notes (Signed)
Review of pertinent gastrointestinal problems:  1. colonic tubular adenomas (1980's by [facility]), most recent colonoscopy 2008 by [facility] found no poylps, + diverticulosis, +small hemorrhoids (she was recommended to have repeat colonoscopy at 7 year interval by Dr. Corinda Gubler)    2. IBS like alternating constip/diarrhea, fall 2009, CBC, BMET, TSH normal. This was in the setting of chronic mild constipation, patient had been taking Metamucil for years  HPI: This is a   very pleasant 77 year old woman whom I last saw about 3-1/2 years ago.  She is still battling constipation. She also has intermittent hemorrhoidal discomforts.  She takes citrucel and miralax every day, but still has to push and strain a lot with BMs.  She does sitz baths.  She has signficant anorectal pressure, itching, and bleeding with times of extreme straining.   Review of systems: Pertinent positive and negative review of systems were noted in the above HPI section. Complete review of systems was performed and was otherwise normal.    Past Medical History  Diagnosis Date  . External hemorrhoids without mention of complication   . Diverticulosis of colon (without mention of hemorrhage)   . Phlebitis     Past Surgical History  Procedure Date  . Hemorrhoidectomy   . Knee surgery     Current Outpatient Prescriptions  Medication Sig Dispense Refill  . aspirin 81 MG tablet Take 81 mg by mouth daily.      . Calcium Carbonate-Vit D-Min (CALCIUM 1200 PO) Take 1 capsule by mouth daily.      . Coenzyme Q10 (COQ-10) 200 MG CAPS Take 1 capsule by mouth daily.      . folic acid (FOLVITE) 400 MCG tablet Take 400 mcg by mouth daily.      Marland Kitchen levothyroxine (SYNTHROID, LEVOTHROID) 50 MCG tablet Take 50 mcg by mouth daily.      . methylcellulose oral powder Take 1 packet by mouth daily.      . Multiple Vitamins-Minerals (CENTRUM SILVER PO) Take 1 capsule by mouth daily.      . Omega-3 Fatty Acids (FISH OIL) 1200 MG CAPS Take 1 capsule by  mouth daily.      . polyethylene glycol (MIRALAX / GLYCOLAX) packet Take 17 g by mouth daily.      . pravastatin (PRAVACHOL) 40 MG tablet Take 40 mg by mouth daily.        Allergies as of 01/02/2012 - Review Complete 01/02/2012  Allergen Reaction Noted  . Morphine and related  01/02/2012  . Sulfonamide derivatives  07/27/2008    Family History  Problem Relation Age of Onset  . Heart disease Father   . Breast cancer      aunt  . Hiatal hernia Sister   . Ulcers Mother     peptic  . Hypertension Brother     History   Social History  . Marital Status: Married    Spouse Name: N/A    Number of Children: 2  . Years of Education: N/A   Occupational History  . Retired    Social History Main Topics  . Smoking status: Former Games developer  . Smokeless tobacco: Never Used  . Alcohol Use: Yes     wine  . Drug Use: No  . Sexually Active: Not on file   Other Topics Concern  . Not on file   Social History Narrative  . No narrative on file       Physical Exam: BP 120/66  Pulse 100  Ht 5\' 3"  (1.6 m)  Wt 157 lb 3.2 oz (71.305 kg)  BMI 27.85 kg/m2 Constitutional: generally well-appearing Psychiatric: alert and oriented x3 Eyes: extraocular movements intact Mouth: oral pharynx moist, no lesions Neck: supple no lymphadenopathy Cardiovascular: heart regular rate and rhythm Lungs: clear to auscultation bilaterally Abdomen: soft, nontender, nondistended, no obvious ascites, no peritoneal signs, normal bowel sounds Extremities: no lower extremity edema bilaterally Skin: no lesions on visible extremities Rectal exam deferred for upcoming colonoscopy   Assessment and plan: 77 y.o. female with  personal history of adenomatous polyps, constipation, symptomatic hemorrhoids.  I'm going to try her on a newer agent for her chronic constipation, linzess at the low dose. She will also use topical ointments for her hemorrhoidal discomforts on an as-needed basis. She is due for polyp  surveillance colonoscopy which we'll perform at her soonest convenience. If medical, conservative measures do not help her symptomatic hemorrhoids that I will likely refer her to Gen. surgery.

## 2012-01-07 ENCOUNTER — Ambulatory Visit (AMBULATORY_SURGERY_CENTER): Payer: BC Managed Care – HMO | Admitting: Gastroenterology

## 2012-01-07 ENCOUNTER — Encounter: Payer: Self-pay | Admitting: Gastroenterology

## 2012-01-07 VITALS — BP 155/84 | HR 88 | Temp 97.7°F | Resp 16 | Ht 63.0 in | Wt 157.0 lb

## 2012-01-07 DIAGNOSIS — K5730 Diverticulosis of large intestine without perforation or abscess without bleeding: Secondary | ICD-10-CM

## 2012-01-07 DIAGNOSIS — Z860100 Personal history of colon polyps, unspecified: Secondary | ICD-10-CM

## 2012-01-07 DIAGNOSIS — D126 Benign neoplasm of colon, unspecified: Secondary | ICD-10-CM

## 2012-01-07 DIAGNOSIS — K649 Unspecified hemorrhoids: Secondary | ICD-10-CM

## 2012-01-07 DIAGNOSIS — K5900 Constipation, unspecified: Secondary | ICD-10-CM

## 2012-01-07 MED ORDER — SODIUM CHLORIDE 0.9 % IV SOLN: 500.0000 mL | INTRAVENOUS | Status: DC

## 2012-01-07 NOTE — Patient Instructions (Signed)
DISCHARGE INSTRUCTIONS GIVEN WITH VERBAL UNDERSTANDING. HANDOUTS ON POLYPS AND DIVERTICULOSIS GIVEN. RESUME PREVIOUS MEDICATIONS.YOU HAD AN ENDOSCOPIC PROCEDURE TODAY AT THE [facility]: Refer to the procedure report that was given to you for any specific questions about what was found during the examination.  If the procedure report does not answer your questions, please call your gastroenterologist to clarify.  If you requested that your care partner not be given the details of your procedure findings, then the procedure report has been included in a sealed envelope for you to review at your convenience later.  YOU SHOULD EXPECT: Some feelings of bloating in the abdomen. Passage of more gas than usual.  Walking can help get rid of the air that was put into your GI tract during the procedure and reduce the bloating. If you had a lower endoscopy (such as a colonoscopy or flexible sigmoidoscopy) you may notice spotting of blood in your stool or on the toilet paper. If you underwent a bowel prep for your procedure, then you may not have a normal bowel movement for a few days.  DIET: Your first meal following the procedure should be a light meal and then it is ok to progress to your normal diet.  A half-sandwich or bowl of soup is an example of a good first meal.  Heavy or fried foods are harder to digest and may make you feel nauseous or bloated.  Likewise meals heavy in dairy and vegetables can cause extra gas to form and this can also increase the bloating.  Drink plenty of fluids but you should avoid alcoholic beverages for 24 hours.  ACTIVITY: Your care partner should take you home directly after the procedure.  You should plan to take it easy, moving slowly for the rest of the day.  You can resume normal activity the day after the procedure however you should NOT DRIVE or use heavy machinery for 24 hours (because of the sedation medicines used during the test).    SYMPTOMS TO REPORT  IMMEDIATELY: A gastroenterologist can be reached at any hour.  During normal business hours, 8:30 AM to 5:00 PM Monday through Friday, call [phone].  After hours and on weekends, please call the GI answering service at [phone] who will take a message and have the physician on call contact you.   Following lower endoscopy (colonoscopy or flexible sigmoidoscopy):  Excessive amounts of blood in the stool  Significant tenderness or worsening of abdominal pains  Swelling of the abdomen that is new, acute  Fever of 100F or higher  FOLLOW UP: If any biopsies were taken you will be contacted by phone or by letter within the next 1-3 weeks.  Call your gastroenterologist if you have not heard about the biopsies in 3 weeks.  Our staff will call the home number listed on your records the next business day following your procedure to check on you and address any questions or concerns that you may have at that time regarding the information given to you following your procedure. This is a courtesy call and so if there is no answer at the home number and we have not heard from you through the emergency physician on call, we will assume that you have returned to your regular daily activities without incident.  SIGNATURES/CONFIDENTIALITY: You and/or your care partner have signed paperwork which will be entered into your electronic medical record.  These signatures attest to the fact that that the information above on your After Visit Summary   has been reviewed and is understood.  Full responsibility of the confidentiality of this discharge information lies with you and/or your care-partner.  

## 2012-01-07 NOTE — Progress Notes (Signed)
Patient did not experience any of the following events: a burn prior to discharge; a fall within the facility; wrong site/side/patient/procedure/implant event; or a hospital transfer or hospital admission upon discharge from the facility. (G8907) Patient did not have preoperative order for IV antibiotic SSI prophylaxis. (G8918)  

## 2012-01-07 NOTE — Op Note (Signed)
[facility] [address] [place], [place]  [address]  COLONOSCOPY PROCEDURE REPORT PATIENT:  [name], [name]  MR#:  [id number] BIRTHDATE:  1935-01-14, 76 yrs. old  GENDER:  female ENDOSCOPIST:  Rachael Fee, MD REF. BY:  [name], M.D. PROCEDURE DATE:  01/07/2012 PROCEDURE:  Colonoscopy with snare polypectomy ASA CLASS:  Class II INDICATIONS:  colonic tubular adenomas (1980's by [facility]), most recent colonoscopy 2008 by [facility] found no poylps, + diverticulosis, +small hemorrhoids (she was recommended to have repeat colonoscopy at 7 year interval by Dr. Corinda Gubler); also new constipation MEDICATIONS:  Fentanyl 75 mcg IV, These medications were titrated to patient response per physician's verbal order, Versed 8 mg IV DESCRIPTION OF PROCEDURE:   After the risks benefits and alternatives of the procedure were thoroughly explained, informed consent was obtained.  Digital rectal exam was performed and revealed no rectal masses.   The LB [id number] endoscope was introduced through the anus and advanced to the cecum, which was identified by both the appendix and ileocecal valve, without limitations.  The quality of the prep was good..  The instrument was then slowly withdrawn as the colon was fully examined. <<PROCEDUREIMAGES>> FINDINGS:  A diminutive polyp was found in the transverse colon. This was removed with a cold snare, sent to pathology (jar 1) (see image5).  Moderate diverticulosis was found in the sigmoid to descending colon segments. In region of numerous diverticulum (25cm from anus) there was a focal stenosis (lumen 12mm) with normal but slightly inflamed mucosa. Benign appearing. I was able to advance the adult colonoscope past the stenosis was mild pressure, resistence (see image7 and image2).  This was otherwise a normal examination of the colon (see image8, image4, and image3).   Retroflexed views in the rectum revealed no abnormalities. COMPLICATIONS:   None ENDOSCOPIC IMPRESSION: 1) Diminutive polyp in the transverse colon 2) Moderate diverticulosis in the sigmoid to descending colon segments. There was a benign appearing, focal stenosis 25cm from anus (in area of diverticulosis).  Scope advanced proximally with mild pressure, resistence.  This location MAY contribute to her constipation. 3) Otherwise normal examination  RECOMMENDATIONS: 1) If the polyp(s) removed today are proven to be adenomatous (pre-cancerous) polyps, you will need a repeat colonoscopy in 5 years. You will receive a letter within 1-2 weeks with the results of your biopsy as well as final recommendations. Please call my office if you have not received a letter after 3 weeks. 2) Resume linzess trial, call Dr. Christella Hartigan to report on your symptoms in 2 weeks.  ______________________________ Rachael Fee, MD  n. eSIGNED:   Rachael Fee at 01/07/2012 02:53 PM  Francella Solian, [id number]

## 2012-01-08 ENCOUNTER — Telehealth: Payer: Self-pay | Admitting: *Deleted

## 2012-01-08 NOTE — Telephone Encounter (Signed)
  Follow up Call-  Call back number 01/07/2012  Post procedure Call Back phone  # [phone] hm  Permission to leave phone message Yes     Patient questions:  Do you have a fever, pain , or abdominal swelling? no Pain Score  0 *  Have you tolerated food without any problems? yes  Have you been able to return to your normal activities? yes  Do you have any questions about your discharge instructions: Diet   no Medications  no Follow up visit  no  Do you have questions or concerns about your Care? no  Actions: * If pain score is 4 or above: No action needed, pain <4.

## 2012-01-16 ENCOUNTER — Encounter: Payer: Self-pay | Admitting: Gastroenterology

## 2012-02-14 ENCOUNTER — Other Ambulatory Visit: Payer: Self-pay | Admitting: Dermatology

## 2012-02-26 ENCOUNTER — Encounter: Payer: Self-pay | Admitting: Gastroenterology

## 2012-02-26 ENCOUNTER — Ambulatory Visit (INDEPENDENT_AMBULATORY_CARE_PROVIDER_SITE_OTHER): Payer: Medicare Other | Admitting: Gastroenterology

## 2012-02-26 VITALS — BP 112/60 | HR 88 | Ht 63.0 in | Wt 158.0 lb

## 2012-02-26 DIAGNOSIS — K5900 Constipation, unspecified: Secondary | ICD-10-CM

## 2012-02-26 NOTE — Progress Notes (Signed)
Review of pertinent gastrointestinal problems:  1. colonic tubular adenomas (1980's by [facility]), most recent colonoscopy 2008 by [facility] found no poylps, + diverticulosis, +small hemorrhoids (she was recommended to have repeat colonoscopy at 7 year interval by Dr. Corinda Gubler) .   May 2013 repeat colonoscopy found small tubular adenoma. Repeat examination in 5 years was recommended 2. IBS like alternating constip/diarrhea, fall 2009, CBC, BMET, TSH normal. This was in the setting of chronic mild constipation, patient had been taking Metamucil for years 3. chronic constipation: Significantly improved with linzess may 2013.  Colonoscopy at that month also found significant diverticulosis with likely diverticular related stenosis at about 25 cm in the anus   HPI: This is a  very pleasant 77 year old woman whom I last saw at the time of a colonoscopy about a month ago.  She was started on linzess for   Chronic constipastiopn  Still has bloating, gassiness.  Her bowels are definitely moving better however.  The bloating has slowly worsened.  NO nausea or vomiting at all.   The bloating is constantly present, not worse after eating.  Overall her weight is up a bit.    Past Medical History  Diagnosis Date  . External hemorrhoids without mention of complication   . Diverticulosis of colon (without mention of hemorrhage)   . Phlebitis   . Arthritis   . Cancer     bladder cancer  . Cataract     left eye  . Hyperlipidemia   . Thyroid disease     hypothyroidism    Past Surgical History  Procedure Date  . Hemorrhoidectomy   . Knee surgery   . Left elbow surgery x4   . Tonsillectomy   . Bladder cancer surgery     Current Outpatient Prescriptions  Medication Sig Dispense Refill  . aspirin 81 MG tablet Take 81 mg by mouth daily.      . Calcium Carbonate-Vit D-Min (CALCIUM 1200 PO) Take 1 capsule by mouth daily.      . Coenzyme Q10 (COQ-10) 200 MG CAPS Take 1 capsule by mouth daily.      Marland Kitchen levothyroxine  (SYNTHROID, LEVOTHROID) 50 MCG tablet Take 50 mcg by mouth daily.      . Linaclotide (LINZESS) 145 MCG CAPS Take 1 capsule by mouth daily.  30 capsule  2  . Multiple Vitamins-Minerals (CENTRUM SILVER PO) Take 1 capsule by mouth daily.      . Omega-3 Fatty Acids (FISH OIL) 1200 MG CAPS Take 1 capsule by mouth daily.      . pravastatin (PRAVACHOL) 40 MG tablet Take 40 mg by mouth daily.        Allergies as of 02/26/2012 - Review Complete 02/26/2012  Allergen Reaction Noted  . Morphine and related Nausea And Vomiting 01/02/2012  . Tramadol Nausea And Vomiting 01/07/2012  . Benadryl (diphenhydramine hcl)  01/07/2012  . Sulfonamide derivatives  07/27/2008    Family History  Problem Relation Age of Onset  . Heart disease Father   . Breast cancer      aunt  . Hiatal hernia Sister   . Ulcers Mother     peptic  . Hypertension Brother   . Colon cancer Neg Hx   . Esophageal cancer Neg Hx   . Rectal cancer Neg Hx   . Stomach cancer Neg Hx     History   Social History  . Marital Status: Married    Spouse Name: N/A    Number of Children: 2  . Years  of Education: N/A   Occupational History  . Retired    Social History Main Topics  . Smoking status: Former Games developer  . Smokeless tobacco: Never Used  . Alcohol Use: 0.0 oz/week    5-6 Glasses of wine per week     wine  . Drug Use: No  . Sexually Active: Not on file   Other Topics Concern  . Not on file   Social History Narrative  . No narrative on file      Physical Exam: BP 112/60  Pulse 88  Ht 5\' 3"  (1.6 m)  Wt 158 lb (71.668 kg)  BMI 27.99 kg/m2 Constitutional: generally well-appearing Psychiatric: alert and oriented x3 Abdomen: soft, nontender, nondistended, no obvious ascites, no peritoneal signs, normal bowel sounds     Assessment and plan: 77 y.o. female with chronic constipation, bloating, mild, likely diverticular related stenosis in sigmoid colon  She will continue on linzess one pill once daily and  will add Gas-X pill with every meal. She will call to report on her symptoms in 4-5 weeks

## 2012-02-26 NOTE — Patient Instructions (Addendum)
Trial of one gas-ex pill with every meal. Continue the linzess, one pill once daily. Call to report on you symptoms in 4-5 weeks.

## 2012-03-16 ENCOUNTER — Encounter (HOSPITAL_COMMUNITY): Payer: Self-pay | Admitting: *Deleted

## 2012-03-16 ENCOUNTER — Emergency Department (HOSPITAL_COMMUNITY): Payer: Medicare Other

## 2012-03-16 ENCOUNTER — Observation Stay (HOSPITAL_COMMUNITY)
Admission: EM | Admit: 2012-03-16 | Discharge: 2012-03-17 | DRG: 313 | Disposition: A | Discharge status: Home / Self Care | Payer: Medicare Other | Source: Ambulatory Visit | Attending: Cardiology | Admitting: Cardiology

## 2012-03-16 DIAGNOSIS — Z8551 Personal history of malignant neoplasm of bladder: Secondary | ICD-10-CM | POA: Insufficient documentation

## 2012-03-16 DIAGNOSIS — R072 Precordial pain: Secondary | ICD-10-CM | POA: Diagnosis present

## 2012-03-16 DIAGNOSIS — Z79899 Other long term (current) drug therapy: Secondary | ICD-10-CM | POA: Insufficient documentation

## 2012-03-16 DIAGNOSIS — K589 Irritable bowel syndrome without diarrhea: Secondary | ICD-10-CM | POA: Insufficient documentation

## 2012-03-16 DIAGNOSIS — R0602 Shortness of breath: Secondary | ICD-10-CM | POA: Insufficient documentation

## 2012-03-16 DIAGNOSIS — I2510 Atherosclerotic heart disease of native coronary artery without angina pectoris: Secondary | ICD-10-CM | POA: Insufficient documentation

## 2012-03-16 DIAGNOSIS — R0789 Other chest pain: Principal | ICD-10-CM | POA: Insufficient documentation

## 2012-03-16 DIAGNOSIS — E039 Hypothyroidism, unspecified: Secondary | ICD-10-CM | POA: Insufficient documentation

## 2012-03-16 DIAGNOSIS — E785 Hyperlipidemia, unspecified: Secondary | ICD-10-CM | POA: Insufficient documentation

## 2012-03-16 DIAGNOSIS — Z9861 Coronary angioplasty status: Secondary | ICD-10-CM | POA: Insufficient documentation

## 2012-03-16 HISTORY — DX: Atherosclerotic heart disease of native coronary artery without angina pectoris: I25.10

## 2012-03-16 LAB — COMPREHENSIVE METABOLIC PANEL
ALT: 18 U/L (ref 0–35)
Alkaline Phosphatase: 80 U/L (ref 39–117)
BUN: 22 mg/dL (ref 6–23)
CO2: 26 mEq/L (ref 19–32)
GFR calc Af Amer: 80 mL/min — ABNORMAL LOW (ref >90)
GFR calc non Af Amer: 69 mL/min — ABNORMAL LOW (ref >90)
Glucose, Bld: 91 mg/dL (ref 70–99)
Potassium: 4.5 mEq/L (ref 3.5–5.1)
Sodium: 142 mEq/L (ref 135–145)
Total Bilirubin: 0.4 mg/dL (ref 0.3–1.2)

## 2012-03-16 LAB — CARDIAC PANEL(CRET KIN+CKTOT+MB+TROPI)
CK, MB: 3.9 ng/mL (ref 0.3–4.0)
Relative Index: INVALID (ref 0.0–2.5)
Total CK: 137 U/L (ref 7–177)
Troponin I: <0.3 ng/mL (ref <0.30)
Troponin I: <0.3 ng/mL (ref <0.30)

## 2012-03-16 LAB — CBC WITH DIFFERENTIAL/PLATELET
Hemoglobin: 14.3 g/dL (ref 12.0–15.0)
Lymphocytes Relative: 16 % (ref 12–46)
Lymphs Abs: 1.5 10*3/uL (ref 0.7–4.0)
MCV: 94.6 fL (ref 78.0–100.0)
Monocytes Relative: 6 % (ref 3–12)
Neutrophils Relative %: 77 % (ref 43–77)
Platelets: 230 10*3/uL (ref 150–400)
RBC: 4.48 MIL/uL (ref 3.87–5.11)
WBC: 9.6 10*3/uL (ref 4.0–10.5)

## 2012-03-16 LAB — POCT I-STAT TROPONIN I: Troponin i, poc: 0 ng/mL (ref 0.00–0.08)

## 2012-03-16 LAB — BASIC METABOLIC PANEL
CO2: 24 mEq/L (ref 19–32)
Calcium: 9.6 mg/dL (ref 8.4–10.5)
Chloride: 105 mEq/L (ref 96–112)
Potassium: 4.2 mEq/L (ref 3.5–5.1)
Sodium: 140 mEq/L (ref 135–145)

## 2012-03-16 LAB — MAGNESIUM: Magnesium: 2.2 mg/dL (ref 1.5–2.5)

## 2012-03-16 MED ORDER — SIMVASTATIN 20 MG PO TABS
20.0000 mg | ORAL_TABLET | Freq: Every day | ORAL | Status: DC
  Administered 2012-03-16: 20 mg via ORAL
  Filled 2012-03-16 (×2): qty 1

## 2012-03-16 MED ORDER — NITROGLYCERIN 0.4 MG SL SUBL: 0.4000 mg | SUBLINGUAL_TABLET | SUBLINGUAL | Status: DC | PRN

## 2012-03-16 MED ORDER — ZOLPIDEM TARTRATE 5 MG PO TABS
10.0000 mg | ORAL_TABLET | Freq: Every evening | ORAL | Status: DC | PRN
  Administered 2012-03-16: 5 mg via ORAL
  Filled 2012-03-16: qty 2

## 2012-03-16 MED ORDER — METOPROLOL TARTRATE 12.5 MG HALF TABLET
12.5000 mg | ORAL_TABLET | Freq: Two times a day (BID) | ORAL | Status: DC
  Filled 2012-03-16 (×3): qty 1

## 2012-03-16 MED ORDER — LEVOTHYROXINE SODIUM 50 MCG PO TABS: 50.0000 ug | ORAL_TABLET | Freq: Every day | ORAL | Status: DC

## 2012-03-16 MED ORDER — LEVOTHYROXINE SODIUM 50 MCG PO TABS
50.0000 ug | ORAL_TABLET | Freq: Every day | ORAL | Status: DC
  Administered 2012-03-17: 50 ug via ORAL
  Filled 2012-03-16 (×2): qty 1

## 2012-03-16 MED ORDER — ASPIRIN EC 81 MG PO TBEC
81.0000 mg | DELAYED_RELEASE_TABLET | Freq: Every day | ORAL | Status: DC
  Administered 2012-03-17: 81 mg via ORAL
  Filled 2012-03-16: qty 1

## 2012-03-16 MED ORDER — ONDANSETRON HCL 4 MG/2ML IJ SOLN: 4.0000 mg | Freq: Four times a day (QID) | INTRAMUSCULAR | Status: DC | PRN

## 2012-03-16 MED ORDER — ACETAMINOPHEN 325 MG PO TABS: 650.0000 mg | ORAL_TABLET | ORAL | Status: DC | PRN

## 2012-03-16 NOTE — ED Notes (Signed)
Patient ready for admission but the room is not ready

## 2012-03-16 NOTE — ED Notes (Signed)
Reports having sob and fatigue x 1 week, having indigestion and pain into left shoulder. ekg done at triage, no acute distress noted.

## 2012-03-16 NOTE — ED Notes (Signed)
Assisted to rest room ambulatory without problems.

## 2012-03-16 NOTE — ED Provider Notes (Signed)
History     CSN: [id number]  Arrival date & time 03/16/12  0910   First MD Initiated Contact with Patient 03/16/12 0940      Chief Complaint  Patient presents with  . Shortness of Breath  . Shoulder Pain    (Consider location/radiation/quality/duration/timing/severity/associated sxs/prior treatment) Patient is a 77 y.o. female presenting with shortness of breath and shoulder pain. The history is provided by the patient and the spouse.  Shortness of Breath  Associated symptoms include shortness of breath.  Shoulder Pain Associated symptoms include shortness of breath.   patient here with intermittent shortness of breath x1 week has been exertional and better with rest. Patient denies any associated diaphoresis or nausea or vomiting. History of coronary artery disease with coronary artery stent placed 3 years ago and she states that her current symptoms are similar to her anginal equivalent. She also notes some constant atraumatic with shoulder pain. Also complains of constant feeling of indigestion. No syncope or presyncope episodes. No change in bowel or bladder function. Does note increased belching which does not affect her symptoms. No medications taken for this prior to arrival to  Past Medical History  Diagnosis Date  . External hemorrhoids without mention of complication   . Diverticulosis of colon (without mention of hemorrhage)   . Phlebitis   . Arthritis   . Cancer     bladder cancer  . Cataract     left eye  . Hyperlipidemia   . Thyroid disease     hypothyroidism    Past Surgical History  Procedure Date  . Hemorrhoidectomy   . Knee surgery   . Left elbow surgery x4   . Tonsillectomy   . Bladder cancer surgery     Family History  Problem Relation Age of Onset  . Heart disease Father   . Breast cancer      aunt  . Hiatal hernia Sister   . Ulcers Mother     peptic  . Hypertension Brother   . Colon cancer Neg Hx   . Esophageal cancer Neg Hx   . Rectal  cancer Neg Hx   . Stomach cancer Neg Hx     History  Substance Use Topics  . Smoking status: Former Games developer  . Smokeless tobacco: Never Used  . Alcohol Use: 0.0 oz/week    5-6 Glasses of wine per week     wine    OB History    Grav Para Term Preterm Abortions TAB SAB Ect Mult Living                  Review of Systems  Respiratory: Positive for shortness of breath.   All other systems reviewed and are negative.    Allergies  Morphine and related; Tramadol; Benadryl; and Sulfonamide derivatives  Home Medications   Current Outpatient Rx  Name Route Sig Dispense Refill  . ASPIRIN 81 MG PO TABS Oral Take 81 mg by mouth daily.    Marland Kitchen CALCIUM 1200 PO Oral Take 1 capsule by mouth daily.    . COQ-10 200 MG PO CAPS Oral Take 1 capsule by mouth daily.    Marland Kitchen LEVOTHYROXINE SODIUM 50 MCG PO TABS Oral Take 50 mcg by mouth daily.    Marland Kitchen LINACLOTIDE 145 MCG PO CAPS Oral Take 1 capsule by mouth daily. 30 capsule 2  . CENTRUM SILVER PO Oral Take 1 capsule by mouth daily.    Marland Kitchen FISH OIL 1200 MG PO CAPS Oral Take 1 capsule by  mouth daily.    Marland Kitchen PRAVASTATIN SODIUM 40 MG PO TABS Oral Take 40 mg by mouth daily.      BP 157/65  Pulse 78  Temp 97.3 F (36.3 C) (Oral)  Resp 20  SpO2 99%  Physical Exam  Nursing note and vitals reviewed. Constitutional: She is oriented to person, place, and time. She appears well-developed and well-nourished.  Non-toxic appearance. No distress.  HENT:  Head: Normocephalic and atraumatic.  Eyes: Conjunctivae, EOM and lids are normal. Pupils are equal, round, and reactive to light.  Neck: Normal range of motion. Neck supple. No tracheal deviation present. No mass present.  Cardiovascular: Normal rate, regular rhythm and normal heart sounds.  Exam reveals no gallop.   No murmur heard. Pulmonary/Chest: Effort normal and breath sounds normal. No stridor. No respiratory distress. She has no decreased breath sounds. She has no wheezes. She has no rhonchi. She has no  rales.  Abdominal: Soft. Normal appearance and bowel sounds are normal. She exhibits no distension. There is no tenderness. There is no rebound and no CVA tenderness.  Musculoskeletal: Normal range of motion. She exhibits no edema and no tenderness.  Neurological: She is alert and oriented to person, place, and time. She has normal strength. No cranial nerve deficit or sensory deficit. GCS eye subscore is 4. GCS verbal subscore is 5. GCS motor subscore is 6.  Skin: Skin is warm and dry. No abrasion and no rash noted.  Psychiatric: She has a normal mood and affect. Her speech is normal and behavior is normal.    ED Course  Procedures (including critical care time)   Labs Reviewed  CBC WITH DIFFERENTIAL  COMPREHENSIVE METABOLIC PANEL  LIPASE, BLOOD   No results found.   No diagnosis found.    MDM   Date: 03/16/2012  Rate: 73  Rhythm: normal sinus rhythm  QRS Axis: normal  Intervals: normal  ST/T Wave abnormalities: normal  Conduction Disutrbances:none  Narrative Interpretation:   Old EKG Reviewed: unchanged   Patient seen by cardiology and will be admitted       Toy Baker, MD 03/16/12 1230

## 2012-03-16 NOTE — ED Notes (Signed)
Patient admitted to 3734 report to [facility] , patient is pain free upon transfer. Vital signs stable

## 2012-03-16 NOTE — Progress Notes (Signed)
03/16/12 1523  Discharge Planning  Type of Residence Private residence  Living Arrangements Spouse/significant other  Home Care Services No  Support Systems Spouse/significant other  Do you have any problems obtaining your medications? No  Family/patient expects to be discharged to: Private residence  Once you are discharged, how will you get to your follow-up appointment? Family  Expected Discharge Date 03/19/12  Case Management Consult Needed No  Social Work Consult Needed No

## 2012-03-16 NOTE — Progress Notes (Signed)
03/16/12 1523  OTHER  CSW Follow Up Status No follow-up needed     Unit based LCSW to be consulted if psychosocial needs are identified and/or disposition issues arise.   Dionne Milo MSW [facility] Emergency Dept. Weekend/Social Worker [phone]

## 2012-03-16 NOTE — ED Notes (Signed)
Patient transported to X-ray 

## 2012-03-16 NOTE — ED Notes (Signed)
Patient advises that she has felt fatigue times 1 week.

## 2012-03-16 NOTE — ED Notes (Signed)
Assisted to restroom, tolerated well NAD at present

## 2012-03-16 NOTE — ED Notes (Signed)
Assisted to Rest Room patient voided without problems.

## 2012-03-16 NOTE — H&P (Signed)
CARDIOLOGY ADMISSION NOTE  Patient ID: [name] MRN: [id number] DOB/AGE: Apr 04, 1935 77 y.o.  Admit date: 03/16/2012 Primary Physician   Darnelle Bos, MD Primary Cardiologist   Dr Mayford Knife Chief Complaint    Chest pain  HPI: The patient presents for evaluation of chest discomfort. She has a has a history of CAD.  Cath in 2010 with 99% mid LAD stenosis treated with DES to the LAD.  Following that she's not required stress testing or catheterization. She's done quite well. She actually exercises with a trainer. She has been noticing some increasing dyspnea with activity which is somewhat reminiscent of her previous anginal equivalent. However, yesterday she was able to walk 22 minutes actively throughout her house. She's been able to do her usual activities. She might get slightly more short of breath but this isn't limiting. She's not describing PND or orthopnea. She was not getting any chest pressure. This morning she did notice some discomfort under her left shoulder blade. She had some perhaps mild chest heaviness. Symptoms were 4/10 at the worst. She did take aspirin. Symptoms have resolved. She's had no associated not see a vomiting or diaphoresis. She's had no palpitations, presyncope or syncope. She's had no PND or orthopnea. He did have burping. She denies any fevers chills or cough.   Past Medical History  Diagnosis Date  . External hemorrhoids without mention of complication   . Diverticulosis of colon (without mention of hemorrhage)   . Phlebitis   . Arthritis   . Cancer     bladder cancer  . Cataract     left eye  . Hyperlipidemia   . Thyroid disease     hypothyroidism  . CAD (coronary artery disease)     Past Surgical History  Procedure Date  . Hemorrhoid surgery   . Total knee arthroplasty     Left  . Left elbow surgery x4   . Tonsillectomy   . Bladder cancer surgery     Allergies  Allergen Reactions  . Morphine And Related Nausea And Vomiting  . Tramadol  Nausea And Vomiting  . Benadryl (Diphenhydramine Hcl)     It wired me per the pt  . Sulfonamide Derivatives     REACTION: hives   No current facility-administered medications on file prior to encounter.   Current Outpatient Prescriptions on File Prior to Encounter  Medication Sig Dispense Refill  . Calcium Carbonate-Vit D-Min (CALCIUM 1200 PO) Take 2 capsules by mouth daily.       . Coenzyme Q10 (COQ-10) 200 MG CAPS Take 1 capsule by mouth daily.      Marland Kitchen levothyroxine (SYNTHROID, LEVOTHROID) 50 MCG tablet Take 50 mcg by mouth daily.      . Linaclotide (LINZESS) 145 MCG CAPS Take 1 capsule by mouth daily.  30 capsule  2  . Multiple Vitamins-Minerals (CENTRUM SILVER PO) Take 1 capsule by mouth daily.      . pravastatin (PRAVACHOL) 40 MG tablet Take 40 mg by mouth at bedtime.       Marland Kitchen zolpidem (AMBIEN) 10 MG tablet Take 10 mg by mouth At bedtime as needed. For sleep       History   Social History  . Marital Status: Married    Spouse Name: N/A    Number of Children: 2  . Years of Education: N/A   Occupational History  . Retired    Social History Main Topics  . Smoking status: Former Smoker    Types: Cigarettes  . Smokeless tobacco:  Never Used   Comment: Quit tobacco 30 years ago.  . Alcohol Use: 0.0 oz/week    5-6 Glasses of wine per week     wine  . Drug Use: No  . Sexually Active: Not on file   Other Topics Concern  . Not on file   Social History Narrative   Lives at home with husband.      Family History  Problem Relation Age of Onset  . Heart disease Father 76    Died of MI  . Breast cancer      aunt  . Hiatal hernia Sister   . Ulcers Mother     peptic  . Hypertension Brother   . Colon cancer Neg Hx   . Esophageal cancer Neg Hx   . Rectal cancer Neg Hx   . Stomach cancer Neg Hx   . Coronary artery disease Son 53    MI  . Coronary artery disease Paternal Uncle 74    Died with CAD    ROS:  Constipation, recent leg cramping. As stated in the HPI and  negative for all other systems.  Physical Exam: Blood pressure 142/69, pulse 73, temperature 97.3 F (36.3 C), temperature source Oral, resp. rate 20, SpO2 100.00%.  GENERAL:  Well appearing HEENT:  Pupils equal round and reactive, fundi not visualized, oral mucosa unremarkable NECK:  No jugular venous distention, waveform within normal limits, carotid upstroke brisk and symmetric, no bruits, no thyromegaly LYMPHATICS:  No cervical, inguinal adenopathy LUNGS:  Clear to auscultation bilaterally BACK:  No CVA tenderness CHEST:  Unremarkable HEART:  PMI not displaced or sustained,S1 and S2 within normal limits, no S3, no S4, no clicks, no rubs, no murmurs ABD:  Flat, positive bowel sounds normal in frequency in pitch, no bruits, no rebound, no guarding, no midline pulsatile mass, no hepatomegaly, no splenomegaly EXT:  2 plus pulses throughout, no edema, no cyanosis no clubbing SKIN:  No rashes no nodules NEURO:  Cranial nerves II through XII grossly intact, motor grossly intact throughout PSYCH:  Cognitively intact, oriented to person place and time  Labs: Lab Results  Component Value Date   BUN 22 03/16/2012   Lab Results  Component Value Date   CREATININE 0.81 03/16/2012   Lab Results  Component Value Date   NA 142 03/16/2012   K 4.5 03/16/2012   CL 106 03/16/2012   CO2 26 03/16/2012   No results found for this basename: CKTOTAL,  CKMB,  CKMBINDEX,  TROPONINI   Lab Results  Component Value Date   WBC 9.6 03/16/2012   HGB 14.3 03/16/2012   HCT 42.4 03/16/2012   MCV 94.6 03/16/2012   PLT 230 03/16/2012   No results found for this basename: CHOL,  HDL,  LDLCALC,  LDLDIRECT,  TRIG,  CHOLHDL   Lab Results  Component Value Date   ALT 18 03/16/2012   AST 22 03/16/2012   ALKPHOS 80 03/16/2012   BILITOT 0.4 03/16/2012     Radiology:  CXR:  No active cardiopulmonary disease or interval change.   EKG:  Sinus rhythm, rate 73, axis within normal limits, intervals within normal limits, no acute ST-T  wave changes.   ASSESSMENT AND PLAN:    Chest pain:  Her chest pain has somewhat atypical features. There is no evidence of ischemia. However, with her known coronary disease she'll be admitted. Enzymes will be cycled. If these are negative, she has no further discomfort and her EKG in the morning is not changed  I think outpatient stress testing would be reasonable.  Hypothyroidism:  I will check a TSH and continue medications as listed.  Dyslipidemia:  We can check a fasting lipid profile with a goal LDL less than 100 and HDL greater than 40.   SignedRollene Rotunda 03/16/2012, 11:47 AM

## 2012-03-16 NOTE — ED Notes (Signed)
Back from x-ray assisted to restroom tolerated well.

## 2012-03-17 LAB — BASIC METABOLIC PANEL
CO2: 28 mEq/L (ref 19–32)
Calcium: 9.7 mg/dL (ref 8.4–10.5)
Potassium: 4.4 mEq/L (ref 3.5–5.1)
Sodium: 142 mEq/L (ref 135–145)

## 2012-03-17 LAB — LIPID PANEL
Cholesterol: 199 mg/dL (ref 0–200)
HDL: 89 mg/dL (ref >39)
Total CHOL/HDL Ratio: 2.2 RATIO

## 2012-03-17 LAB — CARDIAC PANEL(CRET KIN+CKTOT+MB+TROPI): Troponin I: <0.3 ng/mL (ref <0.30)

## 2012-03-17 LAB — CBC
MCV: 94.7 fL (ref 78.0–100.0)
Platelets: 228 10*3/uL (ref 150–400)
RBC: 4.54 MIL/uL (ref 3.87–5.11)
WBC: 7.2 10*3/uL (ref 4.0–10.5)

## 2012-03-17 LAB — TSH: TSH: 0.927 u[IU]/mL (ref 0.350–4.500)

## 2012-03-17 NOTE — Progress Notes (Signed)
CSW received referral from Emergency Department CSW. Pt was assessed by ED CSW and no further csw needs were identified. Per discussion with pt RN, pt plans to dc home and follow up outpatient.   .No further Clinical Social Work needs, signing off.   Catha Gosselin, Theresia Majors  [phone] .03/17/2012 1013am

## 2012-03-17 NOTE — Progress Notes (Signed)
Pt wanted to walk out. Pt discharged to private vehicle home. Pt stable upon discharge.

## 2012-03-17 NOTE — Discharge Summary (Signed)
Patient ID: ZOIE SARIN MRN: [id number] DOB/AGE: March 11, 1935 77 y.o.  Admit date: 03/16/2012 Discharge date: 03/17/2012  Primary Discharge Diagnosis: Chest pain, myocardial infarction ruled out Secondary Discharge Diagnosis: Irritable bowel syndrome  History of colonic bowel  History of bladder cancer  Hyperlipidemia  Hypothyroidism  Significant Diagnostic Studies: None  Consults: None  Hospital Course: She was admitted with atypical left shoulder discomfort. Previous presentation with dyspnea led to LAD stenting. This presentation is different. Cardiac markers and EKGs are unremarkable.   Discharge Exam: Blood pressure 134/84, pulse 66, temperature 97.6 F (36.4 C), temperature source Oral, resp. rate 18, height 5\' 3"  (1.6 m), weight 74.299 kg (163 lb 12.8 oz), SpO2 98.00%.   Cardiac exam and pulmonary exam and normal Labs:   Lab Results  Component Value Date   WBC 7.2 03/17/2012   HGB 14.6 03/17/2012   HCT 43.0 03/17/2012   MCV 94.7 03/17/2012   PLT 228 03/17/2012    Lab 03/17/12 0535 03/16/12 0959  NA 142 --  K 4.4 --  CL 104 --  CO2 28 --  BUN 17 --  CREATININE 0.75 --  CALCIUM 9.7 --  PROT -- 7.4  BILITOT -- 0.4  ALKPHOS -- 80  ALT -- 18  AST -- 22  GLUCOSE 98 --   Lab Results  Component Value Date   CKTOTAL 98 03/17/2012   CKMB 3.1 03/17/2012   TROPONINI <0.30 03/17/2012    Lab Results  Component Value Date   CHOL 199 03/17/2012   Lab Results  Component Value Date   HDL 89 03/17/2012   Lab Results  Component Value Date   LDLCALC 87 03/17/2012   Lab Results  Component Value Date   TRIG 114 03/17/2012   Lab Results  Component Value Date   CHOLHDL 2.2 03/17/2012   No results found for this basename: LDLDIRECT      Radiology: No acute abnormality noted EKG: Normal  FOLLOW UP PLANS AND APPOINTMENTS  Medication List  As of 03/17/2012 10:45 AM   STOP taking these medications         Linaclotide 145 MCG Caps         TAKE these medications        aspirin EC 81 MG tablet   Take 81 mg by mouth daily.      CALCIUM 1200 PO   Take 2 capsules by mouth daily.      CENTRUM SILVER PO   Take 1 capsule by mouth daily.      CoQ-10 200 MG Caps   Take 1 capsule by mouth daily.      levothyroxine 50 MCG tablet   Commonly known as: SYNTHROID, LEVOTHROID   Take 50 mcg by mouth daily.      MAGNESIA PO   Take 1 tablet by mouth daily.      polyethylene glycol packet   Commonly known as: MIRALAX / GLYCOLAX   Take 17 g by mouth at bedtime.      pravastatin 40 MG tablet   Commonly known as: PRAVACHOL   Take 40 mg by mouth at bedtime.      psyllium 95 % Pack   Commonly known as: HYDROCIL/METAMUCIL   Take 1 packet by mouth at bedtime.      zolpidem 10 MG tablet   Commonly known as: AMBIEN   Take 10 mg by mouth At bedtime as needed. For sleep           Follow-up Information  Follow up with Quintella Reichert, MD. (Stress nuclear study will be scheduled and office will call.)    Contact information:   [address] [place] [address] [phone]          BRING ALL MEDICATIONS WITH YOU TO FOLLOW UP APPOINTMENTS  Time spent with patient to include physician time: 15 minutes Signed: Lesleigh Noe 03/17/2012, 10:45 AM

## 2012-06-06 ENCOUNTER — Ambulatory Visit (HOSPITAL_COMMUNITY)
Admission: RE | Admit: 2012-06-06 | Discharge: 2012-06-06 | Disposition: A | Discharge status: Home / Self Care | Payer: Medicare Other | Source: Ambulatory Visit | Attending: Cardiology | Admitting: Cardiology

## 2012-06-06 DIAGNOSIS — M7989 Other specified soft tissue disorders: Secondary | ICD-10-CM | POA: Insufficient documentation

## 2012-06-06 DIAGNOSIS — R609 Edema, unspecified: Secondary | ICD-10-CM

## 2012-06-06 NOTE — Progress Notes (Signed)
VASCULAR LAB PRELIMINARY  PRELIMINARY  PRELIMINARY  PRELIMINARY  Bilateral lower extremity venous duplex completed.    Preliminary report: Negative for deep and superficial vein thrombosis bilaterally.  Report called to Dr. Cain Saupe, Faiga Stones, 06/06/2012, 7:14 PM

## 2012-06-27 ENCOUNTER — Other Ambulatory Visit: Payer: Self-pay | Admitting: Dermatology

## 2012-08-01 ENCOUNTER — Other Ambulatory Visit: Payer: Self-pay | Admitting: Dermatology

## 2012-12-03 ENCOUNTER — Other Ambulatory Visit: Payer: Self-pay | Admitting: Dermatology

## 2013-08-17 ENCOUNTER — Ambulatory Visit (INDEPENDENT_AMBULATORY_CARE_PROVIDER_SITE_OTHER): Payer: Medicare Other | Admitting: *Deleted

## 2013-08-17 DIAGNOSIS — E7800 Pure hypercholesterolemia, unspecified: Secondary | ICD-10-CM

## 2013-08-17 LAB — LIPID PANEL
Cholesterol: 191 mg/dL (ref 0–200)
LDL Cholesterol: 93 mg/dL (ref 0–99)
Total CHOL/HDL Ratio: 3

## 2013-08-26 ENCOUNTER — Ambulatory Visit (INDEPENDENT_AMBULATORY_CARE_PROVIDER_SITE_OTHER): Payer: Medicare Other | Admitting: Pharmacist

## 2013-08-26 VITALS — Wt 161.0 lb

## 2013-08-26 DIAGNOSIS — E785 Hyperlipidemia, unspecified: Secondary | ICD-10-CM

## 2013-08-26 DIAGNOSIS — Z79899 Other long term (current) drug therapy: Secondary | ICD-10-CM

## 2013-08-26 MED ORDER — PSYLLIUM 95 % PO PACK: 1.0000 | PACK | Freq: Two times a day (BID) | ORAL | Status: DC

## 2013-08-26 NOTE — Assessment & Plan Note (Addendum)
Patient's cholesterol is relatively stable despite increasing pravastatin from 40 mg qd up to 80 mg qd.  Patient is having to only take pravastatin 40 mg a few days of the week when her arthritis starts bothering her, but it is manageable.  She admits to walking less over past few months, which may have contributed to LDL trending up some since 05/2013.  She is taking Metamucil once daily given her h/o constipation, but still needs Miralax periodically.  Given her elevated cholesterol, and not able/willing to take more potent prescription lipid lowering medication, will increase metamucil to twice daily.  Her LDL is < 100 mg/dL still at 78 y.o. So won't push statin potency further at this time.  Patient agrees to increase walking and increase Metamucil as well.  Her LDL-P was 1277 in 05/2013 which was trending up, so will check an NMR again in 6 months. Plan: 1.  Continue pravastatin 80 mg daily (cut down to 40 mg for a few days if arthritis pain). 2.  Increase metamucil to twice daily to help with constipation and cholesterol reduction.   3.  Continue working out with Systems analyst twice weekly.  Increase walking again on the other days of the week. 4.  Continue low fat diet. 5.  Recheck cholesterol in 6 months (NMR LipoProfile / Liver function), and see Riki Rusk a few days later.

## 2013-08-26 NOTE — Progress Notes (Signed)
Patient known to me from [facility] here for f/u of her historically elevated LDL due to multiple statin intolerances.  Patient currently on pravastatin 80 mg qd, but has to reduce to 40 mg for a few days periodically due to arthritis pain. Cholesterol hasn't improved over past 3 months despite increasing pravastatin from 40 mg up to 80 mg daily. Patient has failed lipitor and crestor due to severe joint/muscle pains in the past, and therefore refuses to use simvastatin or Zetia as they could possibly cause muscle aches.  We have avoided Welchol in patient as well given her long history of constipation problems.  She currently using metamucil once daily and as needed Miralax for bowel regulation.  She has consistently been working out with her trainer twice weekly.  No significant changes in her diet for past 1 year.  Her son had a stroke a year ago which caused a lot of stress in patient.  She tells me "he is doing better now, and now that he is in good spirits, her stress level is down as well".    Risk factors:  CAD/PCI, age - LDL goal < 70, non-HDL goal < 100 Meds:  Pravastatin 80 mg qd, Co-Q 10. Intolerant:  Crestor, Lipitor (mylagias).  Has refused Zetia and simvastatin in past due to potential for muscle/joint pain.  Avoiding Welchol due to h/o constipation.  Diet:  Patients eating a lot fat diet currently. Exercise:  Working out twice weekly at [employer] with a Systems analyst.  She admits to walking less on the other days of the week, but hopes to start doing this again soon as the weather warms up.  Labs:   08/2013:  TC 191, LDL 93 (goal < 70), HDL 75, TG 113 - Pravastatin 80 mg qd 05/2013:  LDL-P  1277 (goal < 1000), TC 183, HDL 83, TG 74, LDL 85 (goal < 70) - Pravastatin 40 mg qd  Current Outpatient Prescriptions  Medication Sig Dispense Refill  . ALPRAZolam (XANAX) 0.5 MG tablet Take 0.5 mg by mouth 2 (two) times daily as needed for anxiety.      . pravastatin  (PRAVACHOL) 80 MG tablet Take 80 mg by mouth daily.      Marland Kitchen aspirin EC 81 MG tablet Take 81 mg by mouth daily.      . Calcium Carbonate-Vit D-Min (CALCIUM 1200 PO) Take 2 capsules by mouth daily.       . Coenzyme Q10 (COQ-10) 200 MG CAPS Take 1 capsule by mouth daily.      Marland Kitchen levothyroxine (SYNTHROID, LEVOTHROID) 50 MCG tablet Take 50 mcg by mouth daily.      . Magnesium Hydroxide (MAGNESIA PO) Take 1 tablet by mouth daily.      . polyethylene glycol (MIRALAX / GLYCOLAX) packet Take 17 g by mouth at bedtime.      . psyllium (HYDROCIL/METAMUCIL) 95 % PACK Take 1 packet by mouth at bedtime.      Marland Kitchen zolpidem (AMBIEN) 10 MG tablet Take 10 mg by mouth At bedtime as needed. For sleep       No current facility-administered medications for this visit.   Allergies  Allergen Reactions  . Morphine And Related Nausea And Vomiting  . Tramadol Nausea And Vomiting  . Benadryl [Diphenhydramine Hcl]     It wired me per the pt  . Sulfonamide Derivatives     REACTION: hives

## 2013-08-26 NOTE — Patient Instructions (Addendum)
Plan: 1.  Continue pravastatin 80 mg daily (cut down to 40 mg for a few days if arthritis pain). 2.  Increase metamucil to twice daily to help with constipation and cholesterol reduction.   3.  Continue working out with Systems analyst twice weekly.  Increase walking again on the other days of the week. 4.  Continue low fat diet. 5.  Recheck cholesterol in 6 months (NMR LipoProfile / Liver function), and see Riki Rusk a few days later.

## 2013-09-09 ENCOUNTER — Encounter: Payer: Self-pay | Admitting: Cardiology

## 2013-09-11 ENCOUNTER — Encounter: Payer: Self-pay | Admitting: Cardiology

## 2013-10-13 ENCOUNTER — Encounter: Payer: Self-pay | Admitting: General Surgery

## 2013-10-13 DIAGNOSIS — E7800 Pure hypercholesterolemia, unspecified: Secondary | ICD-10-CM

## 2013-10-13 DIAGNOSIS — I25119 Atherosclerotic heart disease of native coronary artery with unspecified angina pectoris: Secondary | ICD-10-CM

## 2013-10-13 DIAGNOSIS — I2510 Atherosclerotic heart disease of native coronary artery without angina pectoris: Secondary | ICD-10-CM | POA: Insufficient documentation

## 2013-10-19 ENCOUNTER — Ambulatory Visit: Payer: Medicare Other | Admitting: Cardiology

## 2013-10-21 ENCOUNTER — Encounter: Payer: Self-pay | Admitting: Cardiology

## 2013-10-21 ENCOUNTER — Ambulatory Visit (INDEPENDENT_AMBULATORY_CARE_PROVIDER_SITE_OTHER): Payer: Medicare Other | Admitting: Cardiology

## 2013-10-21 VITALS — BP 147/83 | HR 76 | Ht 63.0 in | Wt 160.0 lb

## 2013-10-21 DIAGNOSIS — I2510 Atherosclerotic heart disease of native coronary artery without angina pectoris: Secondary | ICD-10-CM

## 2013-10-21 DIAGNOSIS — IMO0001 Reserved for inherently not codable concepts without codable children: Secondary | ICD-10-CM

## 2013-10-21 DIAGNOSIS — R030 Elevated blood-pressure reading, without diagnosis of hypertension: Secondary | ICD-10-CM

## 2013-10-21 DIAGNOSIS — E785 Hyperlipidemia, unspecified: Secondary | ICD-10-CM

## 2013-10-21 NOTE — Progress Notes (Signed)
[place] [place], [place]  [address] Phone: [phone] Fax:  [phone]  Date:  10/21/2013   ID:  JESSE HIRST, DOB Mar 13, 1935, MRN [id number]  PCP:  Horton Finer, MD  Cardiologist:  Fransico Him, MD    History of Present Illness: [name] is a 79 y.o. female with a history of ASCAD and dyslipidemia who presents today for followup.  She is doing well.  She denies any chest pain, SOB, DOE, dizziness, palpitations or syncope.  She occasionally has some mild LE edema.    Wt Readings from Last 3 Encounters:  10/21/13 160 lb (72.576 kg)  08/26/13 161 lb (73.029 kg)  03/16/12 163 lb 12.8 oz (74.299 kg)     Past Medical History  Diagnosis Date  . External hemorrhoids without mention of complication   . Diverticulosis of colon (without mention of hemorrhage)   . Phlebitis   . Arthritis   . Cataract     left eye  . Hyperlipidemia   . Thyroid disease     hypothyroidism  . Anxiety   . Insomnia   . Vitamin D insufficiency     Dr. Joan Flores  . Osteopenia     mild  . Constipation     predominant IBS-miralax and citrucel daily-has used linzess in the past. Dr Macky Lower)  . CAD (coronary artery disease) 2/10    S/P PCI (Stent) of LAD. Normal LVF-No angina  . MSSA (methicillin susceptible Staphylococcus aureus) 2011    buttock abscess  . Colonic stricture 2013    and colon adenoma  . Cancer     bladder cancer    Current Outpatient Prescriptions  Medication Sig Dispense Refill  . ALPRAZolam (XANAX) 0.5 MG tablet Take 0.5 mg by mouth 2 (two) times daily as needed for anxiety. 1/2 tablet      . aspirin EC 81 MG tablet Take 81 mg by mouth daily.      . Biotin 10 MG CAPS Take 1 capsule by mouth daily.      . Calcium Carbonate-Vit D-Min (CALCIUM 1200 PO) Take 2 capsules by mouth daily.       . Coenzyme Q10 (COQ-10) 200 MG CAPS Take 1 capsule by mouth daily.      Marland Kitchen levothyroxine (SYNTHROID, LEVOTHROID) 50 MCG tablet Take 50 mcg by mouth daily.        . Magnesium Hydroxide (MAGNESIA PO) Take 1 tablet by mouth daily.      . Multiple Vitamins-Minerals (CENTRUM CARDIO PO) Take 1 tablet by mouth daily.      . pantoprazole (PROTONIX) 40 MG tablet Take 40 mg by mouth as needed.      . pravastatin (PRAVACHOL) 80 MG tablet Take 80 mg by mouth daily.      . psyllium (HYDROCIL/METAMUCIL) 95 % PACK Take 1 packet by mouth 2 (two) times daily.      Marland Kitchen zolpidem (AMBIEN) 10 MG tablet Take 10 mg by mouth At bedtime as needed. 1/2 tablet For sleep       No current facility-administered medications for this visit.    Allergies:    Allergies  Allergen Reactions  . Morphine And Related Nausea And Vomiting  . Tramadol Nausea And Vomiting  . Benadryl [Diphenhydramine Hcl]     It wired me per the pt  . Crestor [Rosuvastatin]     Muscle Aches  . Lipitor [Atorvastatin]     Muscle aches  . Sulfonamide Derivatives     REACTION:  hives  . Cortisone Rash    Social History:  The patient  reports that she quit smoking about 29 years ago. Her smoking use included Cigarettes. She smoked 0.00 packs per day. She has never used smokeless tobacco. She reports that she drinks alcohol. She reports that she does not use illicit drugs.   Family History:  The patient's family history includes Breast cancer in her sister and another family member; Coronary artery disease (age of onset: 68) in her son; Coronary artery disease (age of onset: 32) in her paternal uncle; Heart disease (age of onset: 68) in her father; Hiatal hernia in her sister; Hypertension in her brother; Lung cancer in her brother; Ulcers in her mother. There is no history of Colon cancer, Esophageal cancer, Rectal cancer, or Stomach cancer.   ROS:  Please see the history of present illness.      All other systems reviewed and negative.   PHYSICAL EXAM: VS:  BP 147/83  Pulse 76  Ht 5\' 3"  (1.6 m)  Wt 160 lb (72.576 kg)  BMI 28.35 kg/m2 Well nourished, well developed, in no acute distress HEENT:  normal Neck: no JVD Cardiac:  normal S1, S2; RRR; no murmur Lungs:  clear to auscultation bilaterally, no wheezing, rhonchi or rales Abd: soft, nontender, no hepatomegaly Ext: no edema Skin: warm and dry Neuro:  CNs 2-12 intact, no focal abnormalities noted       ASSESSMENT AND PLAN:  1. ASCAD with no angina  - continue ASA 2. Dyslipidemia  - continue Pravastatin  -repeat NMR panel in June 3. Elevated BP today  - I encouraged her to cut out all added salt in her diet  - I have asked her to check her BP daily for a week and call with the results.  Followup with me in 6 months  Signed, Fransico Him, MD 10/21/2013 9:29 AM

## 2013-10-21 NOTE — Patient Instructions (Signed)
Your physician recommends that you continue on your current medications as directed. Please refer to the Current Medication list given to you today.  Your physician wants you to follow-up in: 6 Months with Dr Turner You will receive a reminder letter in the mail two months in advance. If you don't receive a letter, please call our office to schedule the follow-up appointment.  

## 2013-11-04 ENCOUNTER — Telehealth: Payer: Self-pay | Admitting: Cardiology

## 2013-11-04 NOTE — Telephone Encounter (Signed)
Pt aware.

## 2013-11-04 NOTE — Telephone Encounter (Signed)
New message   Was told to call back with blood pressure information    2/12144/68  2/13 136/67 2/14 135/53 2/15 138/63 2/16 133/83 2/18 138/52 2/19 142/68 2/24 141/72

## 2013-11-04 NOTE — Telephone Encounter (Signed)
Stable BP continue current meds 

## 2013-11-04 NOTE — Telephone Encounter (Signed)
TO Dr Radford Pax to review

## 2013-11-20 ENCOUNTER — Ambulatory Visit: Payer: Self-pay | Admitting: Obstetrics and Gynecology

## 2013-11-20 ENCOUNTER — Ambulatory Visit: Payer: Self-pay | Admitting: Gynecology

## 2013-12-01 ENCOUNTER — Encounter: Payer: Self-pay | Admitting: Obstetrics and Gynecology

## 2013-12-02 ENCOUNTER — Encounter: Payer: Self-pay | Admitting: Obstetrics and Gynecology

## 2013-12-02 ENCOUNTER — Ambulatory Visit (INDEPENDENT_AMBULATORY_CARE_PROVIDER_SITE_OTHER): Payer: Medicare Other | Admitting: Gynecology

## 2013-12-02 ENCOUNTER — Encounter: Payer: Self-pay | Admitting: Gynecology

## 2013-12-02 VITALS — BP 136/90 | HR 80 | Resp 16 | Ht 62.75 in | Wt 160.0 lb

## 2013-12-02 DIAGNOSIS — Z01419 Encounter for gynecological examination (general) (routine) without abnormal findings: Secondary | ICD-10-CM

## 2013-12-02 DIAGNOSIS — N952 Postmenopausal atrophic vaginitis: Secondary | ICD-10-CM

## 2013-12-02 NOTE — Progress Notes (Signed)
79 y.o. Married Caucasian female   G2P2002 here for annual exam. does have vaginal dryness.  She is not nusing lubricants,.  She does not report post-menopasual bleeding.     No LMP recorded. Patient is postmenopausal.          Sexually active: no  The current method of family planning is post menopausal status.    Exercising: yes  walking weights, personal trainer  Last pap: 10/24/10 Negative  Abnormal PAP: Mammogram: 12/04/11 ; April 13th 2015 BSE: no Colonoscopy: 2008 DEXA: 2011  Alcohol: wine with dinner Tobacco: no  Labs: Horton Finer, MD  Health Maintenance  Topic Date Due  . Tetanus/tdap  03/05/1956  . Zostavax  03/05/1997  . Pneumococcal Polysaccharide Vaccine Age 67 And Over  03/05/2002  . Influenza Vaccine  04/10/2013  . Colonoscopy  01/06/2017    Family History  Problem Relation Age of Onset  . Heart disease Father 6    Died of MI  . Heart attack Father 51  . Breast cancer      aunt  . Hiatal hernia Sister   . Breast cancer Sister 72  . Ulcers Mother     peptic  . Alzheimer's disease Mother   . Hypertension Brother   . Lung cancer Brother   . Colon cancer Neg Hx   . Esophageal cancer Neg Hx   . Rectal cancer Neg Hx   . Stomach cancer Neg Hx   . Coronary artery disease Son 52    MI  . Stroke Son   . Coronary artery disease Paternal Uncle 88    Died with CAD  . Diabetes Maternal Grandmother   . Heart attack Maternal Grandfather     70's    Patient Active Problem List   Diagnosis Date Noted  . Elevated blood pressure 10/21/2013  . Coronary atherosclerosis of native coronary artery 10/13/2013  . Pure hypercholesterolemia 10/13/2013  . Hyperlipidemia 08/26/2013  . Precordial pain 03/16/2012  . IBS 07/27/2008  . PERSONAL HX COLONIC POLYPS 07/27/2008    Past Medical History  Diagnosis Date  . External hemorrhoids without mention of complication   . Diverticulosis of colon (without mention of hemorrhage)   . Phlebitis   . Arthritis    . Cataract     left eye  . Hyperlipidemia   . Thyroid disease     hypothyroidism  . Anxiety   . Insomnia   . Vitamin D insufficiency     Dr. Joan Flores  . Osteopenia     mild  . Constipation     predominant IBS-miralax and citrucel daily-has used linzess in the past. Dr Macky Lower)  . CAD (coronary artery disease) 2/10    S/P PCI (Stent) of LAD. Normal LVF-No angina  . MSSA (methicillin susceptible Staphylococcus aureus) 2011    buttock abscess  . Colonic stricture 2013    and colon adenoma  . Squamous cell carcinoma, leg right  . IBS (irritable bowel syndrome)   . Bladder cancer     Past Surgical History  Procedure Laterality Date  . Hemorrhoid surgery    . Total knee arthroplasty  06/2000    Left  . Left elbow surgery x4    . Tonsillectomy    . Bladder cancer surgery    . Knee surgery      Age 29-30    Allergies: Morphine and related; Tramadol; Benadryl; Crestor; Lipitor; Sulfonamide derivatives; and Cortisone  Current Outpatient Prescriptions  Medication Sig Dispense Refill  . aspirin EC  81 MG tablet Take 81 mg by mouth daily.      . Calcium Carbonate-Vit D-Min (CALCIUM 1200 PO) Take 2 capsules by mouth daily.       . Coenzyme Q10 (COQ-10) 200 MG CAPS Take 1 capsule by mouth daily.      Marland Kitchen levothyroxine (SYNTHROID, LEVOTHROID) 50 MCG tablet Take 50 mcg by mouth daily.      . Magnesium Hydroxide (MAGNESIA PO) Take 1 tablet by mouth daily.      . pravastatin (PRAVACHOL) 80 MG tablet Take 80 mg by mouth daily.      . psyllium (HYDROCIL/METAMUCIL) 95 % PACK Take 1 packet by mouth 2 (two) times daily.      Marland Kitchen zolpidem (AMBIEN) 10 MG tablet Take 10 mg by mouth At bedtime as needed. 1/2 tablet For sleep      . ALPRAZolam (XANAX) 0.5 MG tablet Take 0.5 mg by mouth 2 (two) times daily as needed for anxiety. 1/2 tablet      . Biotin 10 MG CAPS Take 1 capsule by mouth daily.      . Multiple Vitamins-Minerals (CENTRUM CARDIO PO) Take 1 tablet by mouth daily.      . pantoprazole  (PROTONIX) 40 MG tablet Take 40 mg by mouth as needed.       No current facility-administered medications for this visit.    ROS: Pertinent items are noted in HPI.  Exam:    BP 136/90  Pulse 80  Resp 16  Ht 5' 2.75" (1.594 m)  Wt 160 lb (72.576 kg)  BMI 28.56 kg/m2 Weight change: @WEIGHTCHANGE @ Last 3 height recordings:  Ht Readings from Last 3 Encounters:  12/02/13 5' 2.75" (1.594 m)  10/21/13 5\' 3"  (1.6 m)  03/16/12 5\' 3"  (1.6 m)   General appearance: alert, cooperative and appears stated age Head: Normocephalic, without obvious abnormality, atraumatic Neck: no adenopathy, no carotid bruit, no JVD, supple, symmetrical, trachea midline and thyroid not enlarged, symmetric, no tenderness/mass/nodules Lungs: clear to auscultation bilaterally Breasts: normal appearance, no masses or tenderness Heart: regular rate and rhythm, S1, S2 normal, no murmur, click, rub or gallop Abdomen: soft, non-tender; bowel sounds normal; no masses,  no organomegaly Extremities: extremities normal, atraumatic, no cyanosis or edema Skin: Skin color, texture, turgor normal. No rashes or lesions Lymph nodes: Cervical, supraclavicular, and axillary nodes normal. no inguinal nodes palpated Neurologic: Grossly normal   Pelvic: External genitalia:  atrophic appearance              Urethra: normal appearing urethra with no masses, tenderness or lesions              Bartholins and Skenes: normal                 Vagina: atrophic              Cervix: normal appearance              Pap taken: no        Bimanual Exam:  Uterus:  Small mobile                                      Adnexa:    no masses  Rectovaginal: Confirms                                      Anus:  normal sphincter tone, no lesions  A: well woman Contraceptive management     P: mammogram counseled on breast self exam, mammography screening, adequate intake of calcium and vitamin D, diet and  exercise return annually or prn Discussed PAP guideline changes, importance of weight bearing exercises, calcium, vit D and balanced diet.  An After Visit Summary was printed and given to the patient.

## 2013-12-02 NOTE — Patient Instructions (Signed)

## 2014-02-22 ENCOUNTER — Other Ambulatory Visit: Payer: Medicare Other

## 2014-02-24 ENCOUNTER — Ambulatory Visit: Payer: Medicare Other | Admitting: Pharmacist

## 2014-04-13 ENCOUNTER — Other Ambulatory Visit (INDEPENDENT_AMBULATORY_CARE_PROVIDER_SITE_OTHER): Payer: Medicare Other

## 2014-04-13 DIAGNOSIS — Z79899 Other long term (current) drug therapy: Secondary | ICD-10-CM

## 2014-04-13 DIAGNOSIS — E785 Hyperlipidemia, unspecified: Secondary | ICD-10-CM

## 2014-04-13 LAB — HEPATIC FUNCTION PANEL
ALT: 19 U/L (ref 0–35)
AST: 24 U/L (ref 0–37)
Albumin: 4.1 g/dL (ref 3.5–5.2)
Alkaline Phosphatase: 68 U/L (ref 39–117)
Bilirubin, Direct: 0.1 mg/dL (ref 0.0–0.3)
Total Bilirubin: 0.8 mg/dL (ref 0.2–1.2)
Total Protein: 6.7 g/dL (ref 6.0–8.3)

## 2014-04-14 LAB — NMR LIPOPROFILE WITH LIPIDS
CHOLESTEROL, TOTAL: 207 mg/dL — AB (ref <200)
HDL PARTICLE NUMBER: 43.3 umol/L (ref >=30.5)
HDL Size: 9.8 nm (ref >=9.2)
HDL-C: 75 mg/dL (ref >=40)
LARGE HDL: 14.4 umol/L (ref >=4.8)
LARGE VLDL-P: 2.9 nmol/L — AB (ref <=2.7)
LDL (calc): 95 mg/dL (ref <100)
LDL PARTICLE NUMBER: 1001 nmol/L — AB (ref <1000)
LDL Size: 21 nm (ref >20.5)
LP-IR Score: 29 (ref <=45)
SMALL LDL PARTICLE NUMBER: 328 nmol/L (ref <=527)
TRIGLYCERIDES: 185 mg/dL — AB (ref <150)
VLDL Size: 44.9 nm (ref <=46.6)

## 2014-04-15 ENCOUNTER — Encounter: Payer: Self-pay | Admitting: General Surgery

## 2014-04-20 ENCOUNTER — Ambulatory Visit (INDEPENDENT_AMBULATORY_CARE_PROVIDER_SITE_OTHER): Payer: Medicare Other | Admitting: Pharmacist

## 2014-04-20 VITALS — Wt 161.0 lb

## 2014-04-20 DIAGNOSIS — E785 Hyperlipidemia, unspecified: Secondary | ICD-10-CM

## 2014-04-20 DIAGNOSIS — Z79899 Other long term (current) drug therapy: Secondary | ICD-10-CM

## 2014-04-20 NOTE — Assessment & Plan Note (Signed)
Patient's LDL-P number is 1000 nmol/L despite LDL being stable ~ 95 mg/dL.  Given she is not willing to change to different statin or add Zetia for fear of side effect, and as her LDL particle number is ~ 1000 nmol/L, will continue pravastatin 80 mg + metamucil and recheck blood work in 6 months.  If everything looks good in 6 months, will likely just check blood work only for future appointments.

## 2014-04-20 NOTE — Patient Instructions (Addendum)
1.  Continue pravastatin 80 mg once daily.   2.  Continue metamucil 1-2 times per day 3.  Recheck NMR LipoProfile and liver function in 6 months (11/02/14 - fasting lab), see Ysidro Evert the following week (11/09/14 at 9:30 am)

## 2014-04-20 NOTE — Progress Notes (Signed)
Patient known to me from [facility] here for f/u of her historically elevated LDL due to multiple statin intolerances.  Patient currently on pravastatin 80 mg qd, but has to reduce to 40 mg for a few days periodically due to arthritis pain.  NMR was done showing LDL-P of 1000 and LDL of 95 mg/dL on regimen. Goal LDL-P is < 1000. Patient has failed lipitor and crestor due to severe joint/muscle pains in the past, and therefore refuses to use simvastatin or Zetia as they could possibly cause muscle aches.  We have avoided Welchol in patient as well given her long history of constipation problems.  She currently using metamucil 1-2 times daily and as needed Miralax for bowel regulation.  She has consistently been working out with her trainer twice weekly.  No significant changes in her diet for past 1 year.    Risk factors:  CAD/PCI, age - LDL goal < 70, non-HDL goal < 100 Meds:  Pravastatin 80 mg qd, Co-Q 10. Intolerant:  Crestor, Lipitor (mylagias).  Has refused Zetia and simvastatin in past due to potential for muscle/joint pain.  Avoiding Welchol due to h/o constipation.  Diet:  Patients eating a lot fat diet currently. Exercise:  Working out twice weekly at [employer] with a Physiological scientist.  She admits to walking less on the other days of the week, but hopes to start doing this again soon as the weather warms up.  Labs:   04/2014:  TC 207, LDL 95, LDL-P number 1001 (goal < 1000), TG 185, HDL 75 (pravastatin 80 mg qd, metamucil 1-2 times per day) 08/2013:  TC 191, LDL 93 (goal < 70), HDL 75, TG 113 - Pravastatin 80 mg qd 05/2013:  LDL-P  1277 (goal < 1000), TC 183, HDL 83, TG 74, LDL 85 (goal < 70) - Pravastatin 40 mg qd  Current Outpatient Prescriptions  Medication Sig Dispense Refill  . ALPRAZolam (XANAX) 0.5 MG tablet Take 0.5 mg by mouth 2 (two) times daily as needed for anxiety. 1/2 tablet      . aspirin EC 81 MG tablet Take 81 mg by mouth daily.      . Biotin 10 MG CAPS Take  1 capsule by mouth daily.      . Calcium Carbonate-Vit D-Min (CALCIUM 1200 PO) Take 2 capsules by mouth daily.       . Coenzyme Q10 (COQ-10) 200 MG CAPS Take 1 capsule by mouth daily.      Marland Kitchen levothyroxine (SYNTHROID, LEVOTHROID) 50 MCG tablet Take 50 mcg by mouth daily.      . Magnesium Hydroxide (MAGNESIA PO) Take 1 tablet by mouth daily.      . Multiple Vitamins-Minerals (CENTRUM CARDIO PO) Take 1 tablet by mouth daily.      . pantoprazole (PROTONIX) 40 MG tablet Take 40 mg by mouth as needed.      . pravastatin (PRAVACHOL) 80 MG tablet Take 80 mg by mouth daily.      . psyllium (HYDROCIL/METAMUCIL) 95 % PACK Take 1 packet by mouth 2 (two) times daily.      Marland Kitchen zolpidem (AMBIEN) 10 MG tablet Take 10 mg by mouth At bedtime as needed. 1/2 tablet For sleep       No current facility-administered medications for this visit.   Allergies  Allergen Reactions  . Morphine And Related Nausea And Vomiting  . Tramadol Nausea And Vomiting  . Benadryl [Diphenhydramine Hcl]     It wired me per the pt  .  Crestor [Rosuvastatin]     Muscle Aches  . Lipitor [Atorvastatin]     Muscle aches  . Sulfonamide Derivatives     REACTION: hives  . Cortisone Rash

## 2014-04-21 NOTE — Progress Notes (Signed)
[place], [name], [place]  [address] Phone: [phone] Fax:  [phone]  Date:  04/22/2014   ID:  DAMIKA Garcia, DOB May 13, 1935, MRN [id number]  PCP:  Horton Finer, MD  Cardiologist:  Fransico Him, MD     History of Present Illness: [name] is a 79 y.o. female with a history of ASCAD and dyslipidemia who presents today for followup. She is doing well. She denies any chest pain, SOB, DOE, dizziness, palpitations or syncope. She occasionally has some mild LE edema.    Wt Readings from Last 3 Encounters:  04/22/14 165 lb (74.844 kg)  04/20/14 161 lb (73.029 kg)  12/02/13 160 lb (72.576 kg)     Past Medical History  Diagnosis Date  . External hemorrhoids without mention of complication   . Diverticulosis of colon (without mention of hemorrhage)   . Phlebitis   . Arthritis   . Cataract     left eye  . Hyperlipidemia   . Thyroid disease     hypothyroidism  . Anxiety   . Insomnia   . Vitamin D insufficiency     Dr. Joan Flores  . Osteopenia     mild  . Constipation     predominant IBS-miralax and citrucel daily-has used linzess in the past. Dr Macky Lower)  . CAD (coronary artery disease) 2/10    S/P PCI (Stent) of LAD. Normal LVF-No angina  . MSSA (methicillin susceptible Staphylococcus aureus) 2011    buttock abscess  . Colonic stricture 2013    and colon adenoma  . Squamous cell carcinoma, leg 2012    right, left  . IBS (irritable bowel syndrome)   . Bladder cancer 2012    Current Outpatient Prescriptions  Medication Sig Dispense Refill  . ALPRAZolam (XANAX) 0.5 MG tablet Take 0.5 mg by mouth 2 (two) times daily as needed for anxiety. 1/2 tablet      . aspirin EC 81 MG tablet Take 81 mg by mouth daily.      . Biotin 10 MG CAPS Take 1 capsule by mouth daily.      . Calcium Carbonate-Vit D-Min (CALCIUM 1200 PO) Take 2 capsules by mouth daily.       . Coenzyme Q10 (COQ-10) 200 MG CAPS Take 1 capsule by mouth daily.      Marland Kitchen levothyroxine  (SYNTHROID, LEVOTHROID) 50 MCG tablet Take 50 mcg by mouth daily.      . Magnesium Hydroxide (MAGNESIA PO) Take 1 tablet by mouth daily.      . Multiple Vitamins-Minerals (CENTRUM CARDIO PO) Take 1 tablet by mouth daily.      . pantoprazole (PROTONIX) 40 MG tablet Take 40 mg by mouth as needed.      . pravastatin (PRAVACHOL) 80 MG tablet Take 80 mg by mouth daily.      . psyllium (HYDROCIL/METAMUCIL) 95 % PACK Take 1 packet by mouth 2 (two) times daily.      Marland Kitchen zolpidem (AMBIEN) 10 MG tablet Take 10 mg by mouth At bedtime as needed. 1/2 tablet For sleep       No current facility-administered medications for this visit.    Allergies:    Allergies  Allergen Reactions  . Morphine And Related Nausea And Vomiting  . Tramadol Nausea And Vomiting  . Benadryl [Diphenhydramine Hcl]     It wired me per the pt  . Crestor [Rosuvastatin]     Muscle Aches  . Lipitor [Atorvastatin]     Muscle aches  .  Sulfonamide Derivatives     REACTION: hives  . Cortisone Rash    Social History:  The patient  reports that she quit smoking about 29 years ago. Her smoking use included Cigarettes. She smoked 0.00 packs per day. She has never used smokeless tobacco. She reports that she drinks alcohol. She reports that she does not use illicit drugs.   Family History:  The patient's family history includes Alzheimer's disease in her mother; Breast cancer in an other family member; Breast cancer (age of onset: 47) in her sister; Coronary artery disease (age of onset: 60) in her son; Coronary artery disease (age of onset: 1) in her paternal uncle; Diabetes in her maternal grandmother; Heart attack in her maternal grandfather; Heart attack (age of onset: 92) in her father; Heart disease (age of onset: 54) in her father; Hiatal hernia in her sister; Hypertension in her brother; Lung cancer in her brother; Stroke in her son; Ulcers in her mother. There is no history of Colon cancer, Esophageal cancer, Rectal cancer, or  Stomach cancer.   ROS:  Please see the history of present illness.      All other systems reviewed and negative.   PHYSICAL EXAM: VS:  BP 142/86  Pulse 84  Ht 5\' 3"  (1.6 m)  Wt 165 lb (74.844 kg)  BMI 29.24 kg/m2 Well nourished, well developed, in no acute distress HEENT: normal Neck: no JVD Cardiac:  normal S1, S2; RRR; no murmur Lungs:  clear to auscultation bilaterally, no wheezing, rhonchi or rales Abd: soft, nontender, no hepatomegaly Ext: no edema Skin: warm and dry Neuro:  CNs 2-12 intact, no focal abnormalities noted  EKG:  NSR with no ST changes  ASSESSMENT AND PLAN:  1. ASCAD with no angina - continue ASA  2. Dyslipidemia - continue Pravastatin  -  I have discussed with her that her LDL is still not at goal.  It was 95 on last check.  I have recommended that she try Zetia in addition to Pravachol.  She was leary about doing this at first due to problems with arthritis but she is willing to try it.  Will start Zetia 10 mg daily and recheck fasting lipid with NMR and ALT in 8 weeks  Followup with me in 6 months       Signed, Fransico Him, MD 04/22/2014 3:23 PM

## 2014-04-22 ENCOUNTER — Ambulatory Visit (INDEPENDENT_AMBULATORY_CARE_PROVIDER_SITE_OTHER): Payer: Medicare Other | Admitting: Cardiology

## 2014-04-22 ENCOUNTER — Encounter: Payer: Self-pay | Admitting: Cardiology

## 2014-04-22 VITALS — BP 142/86 | HR 84 | Ht 63.0 in | Wt 165.0 lb

## 2014-04-22 DIAGNOSIS — E785 Hyperlipidemia, unspecified: Secondary | ICD-10-CM

## 2014-04-22 DIAGNOSIS — I2510 Atherosclerotic heart disease of native coronary artery without angina pectoris: Secondary | ICD-10-CM

## 2014-04-22 MED ORDER — EZETIMIBE 10 MG PO TABS: 10.0000 mg | ORAL_TABLET | Freq: Every day | ORAL | Status: DC

## 2014-04-22 NOTE — Patient Instructions (Signed)
Your physician has recommended you make the following change in your medication: 1. Start Zetia 10 MG 1 tablet daily   Your physician recommends that you return for lab work in: 8 weeks on 06/17/14 for NMR and Hepatic Panel  Your physician wants you to follow-up in: 6 months with Dr Mallie Snooks will receive a reminder letter in the mail two months in advance. If you don't receive a letter, please call our office to schedule the follow-up appointment.

## 2014-06-10 ENCOUNTER — Other Ambulatory Visit: Payer: Self-pay | Admitting: *Deleted

## 2014-06-10 MED ORDER — PRAVASTATIN SODIUM 80 MG PO TABS: 80.0000 mg | ORAL_TABLET | Freq: Every day | ORAL | Status: DC

## 2014-06-17 ENCOUNTER — Other Ambulatory Visit: Payer: Medicare Other

## 2014-06-21 ENCOUNTER — Other Ambulatory Visit (INDEPENDENT_AMBULATORY_CARE_PROVIDER_SITE_OTHER): Payer: Medicare Other | Admitting: *Deleted

## 2014-06-21 DIAGNOSIS — E785 Hyperlipidemia, unspecified: Secondary | ICD-10-CM

## 2014-06-21 DIAGNOSIS — Z79899 Other long term (current) drug therapy: Secondary | ICD-10-CM

## 2014-06-21 LAB — HEPATIC FUNCTION PANEL
ALBUMIN: 3.6 g/dL (ref 3.5–5.2)
ALT: 19 U/L (ref 0–35)
AST: 24 U/L (ref 0–37)
Alkaline Phosphatase: 63 U/L (ref 39–117)
Bilirubin, Direct: 0 mg/dL (ref 0.0–0.3)
Total Bilirubin: 0.5 mg/dL (ref 0.2–1.2)
Total Protein: 7.1 g/dL (ref 6.0–8.3)

## 2014-06-23 ENCOUNTER — Telehealth: Payer: Self-pay | Admitting: Cardiology

## 2014-06-23 ENCOUNTER — Encounter: Payer: Self-pay | Admitting: General Surgery

## 2014-06-23 LAB — NMR LIPOPROFILE WITH LIPIDS
Cholesterol, Total: 164 mg/dL (ref 100–199)
HDL PARTICLE NUMBER: 46.5 umol/L (ref >=30.5)
HDL Size: 9.5 nm (ref >=9.2)
HDL-C: 78 mg/dL (ref >39)
LARGE HDL: 13.2 umol/L (ref >=4.8)
LDL CALC: 63 mg/dL (ref 0–99)
LDL Particle Number: 887 nmol/L (ref <1000)
LDL SIZE: 20.7 nm (ref >=20.8)
Large VLDL-P: 1.4 nmol/L (ref <=2.7)
SMALL LDL PARTICLE NUMBER: 417 nmol/L (ref <=527)
Triglycerides: 115 mg/dL (ref 0–149)
VLDL Size: 42.5 nm (ref <=46.6)

## 2014-06-23 NOTE — Telephone Encounter (Signed)
Pt is aware.  

## 2014-06-23 NOTE — Telephone Encounter (Signed)
New message ° ° °Pt calling for lab results °

## 2014-10-04 ENCOUNTER — Telehealth: Payer: Self-pay | Admitting: Cardiology

## 2014-10-04 NOTE — Telephone Encounter (Signed)
New message      Pt has an appt with Dr Radford Pax on 10-21-14.  Should she have labs drawn prior to her appt?

## 2014-10-04 NOTE — Telephone Encounter (Signed)
Instructed patient to come to her scheduled appointment and Dr. Radford Pax will order necessary labs at that time. Patient grateful for callback.

## 2014-10-16 ENCOUNTER — Other Ambulatory Visit: Payer: Self-pay | Admitting: Cardiology

## 2014-10-21 ENCOUNTER — Ambulatory Visit (INDEPENDENT_AMBULATORY_CARE_PROVIDER_SITE_OTHER): Payer: Medicare Other | Admitting: Cardiology

## 2014-10-21 ENCOUNTER — Encounter: Payer: Self-pay | Admitting: Cardiology

## 2014-10-21 VITALS — BP 148/78 | HR 101 | Ht 63.0 in | Wt 160.8 lb

## 2014-10-21 DIAGNOSIS — R030 Elevated blood-pressure reading, without diagnosis of hypertension: Secondary | ICD-10-CM

## 2014-10-21 DIAGNOSIS — E785 Hyperlipidemia, unspecified: Secondary | ICD-10-CM

## 2014-10-21 DIAGNOSIS — E7800 Pure hypercholesterolemia, unspecified: Secondary | ICD-10-CM

## 2014-10-21 DIAGNOSIS — IMO0001 Reserved for inherently not codable concepts without codable children: Secondary | ICD-10-CM

## 2014-10-21 DIAGNOSIS — I2510 Atherosclerotic heart disease of native coronary artery without angina pectoris: Secondary | ICD-10-CM

## 2014-10-21 NOTE — Progress Notes (Signed)
Cardiology Office Note   Date:  10/21/2014   ID:  [name], DOB 05/09/1935, MRN [id number]  PCP:  Sally Heckle, MD  Cardiologist:   Sally Margarita, MD   Chief Complaint  Patient presents with  . Coronary Artery Disease  . Hyperlipidemia      History of Present Illness: [name] is a 80 y.o. female with a history of ASCAD and dyslipidemia who presents today for followup. She is doing well. She denies any chest pain, SOB, DOE, dizziness, palpitations or syncope. She occasionally has some mild LE edema which is controlled.      Past Medical History  Diagnosis Date  . External hemorrhoids without mention of complication   . Diverticulosis of colon (without mention of hemorrhage)   . Phlebitis   . Arthritis   . Cataract     left eye  . Hyperlipidemia   . Thyroid disease     hypothyroidism  . Anxiety   . Insomnia   . Vitamin D insufficiency     Dr. Joan Garcia  . Osteopenia     mild  . Constipation     predominant IBS-miralax and citrucel daily-has used linzess in the past. Dr [name])  . CAD (coronary artery disease) 2/10    S/P PCI (Stent) of LAD. Normal LVF-No angina  . MSSA (methicillin susceptible Staphylococcus aureus) 2011    buttock abscess  . Colonic stricture 2013    and colon adenoma  . Squamous cell carcinoma, leg 2012    right, left  . IBS (irritable bowel syndrome)   . Bladder cancer 2012    Past Surgical History  Procedure Laterality Date  . Hemorrhoid surgery    . Total knee arthroplasty  06/2000    Left  . Left elbow surgery x4    . Tonsillectomy    . Bladder cancer surgery  2012    Dr [name]  . Knee surgery      Age 42-30  . Coronary angioplasty with stent placement  10/2008     Current Outpatient Prescriptions  Medication Sig Dispense Refill  . ALPRAZolam (XANAX) 0.5 MG tablet Take 0.5 mg by mouth 2 (two) times daily as needed for anxiety. 1/2 tablet    . aspirin EC 81 MG tablet Take 81 mg by mouth daily.    . Biotin  10 MG CAPS Take 1 capsule by mouth daily.    . Calcium Carbonate-Vit D-Min (CALCIUM 1200 PO) Take 2 capsules by mouth daily.     . Coenzyme Q10 (COQ-10) 200 MG CAPS Take 1 capsule by mouth daily.    Marland Kitchen ezetimibe (ZETIA) 10 MG tablet Take 1 tablet (10 mg total) by mouth daily. 30 tablet 6  . levothyroxine (SYNTHROID, LEVOTHROID) 50 MCG tablet Take 50 mcg by mouth daily.    . Magnesium Hydroxide (MAGNESIA PO) Take 1 tablet by mouth daily.    . Multiple Vitamins-Minerals (CENTRUM CARDIO PO) Take 1 tablet by mouth daily.    . pantoprazole (PROTONIX) 40 MG tablet Take 40 mg by mouth as needed.    . pravastatin (PRAVACHOL) 80 MG tablet TAKE 1 TABLET (80 MG TOTAL) BY MOUTH DAILY. 30 tablet 0  . psyllium (HYDROCIL/METAMUCIL) 95 % PACK Take 1 packet by mouth 2 (two) times daily.    Marland Kitchen zolpidem (AMBIEN) 10 MG tablet Take 10 mg by mouth At bedtime as needed. 1/2 tablet For sleep     No current facility-administered medications for this visit.    Allergies:  Morphine and related; Tramadol; Benadryl; Crestor; Lipitor; Sulfonamide derivatives; and Cortisone    Social History:  The patient  reports that she quit smoking about 30 years ago. Her smoking use included Cigarettes. She has never used smokeless tobacco. She reports that she drinks alcohol. She reports that she does not use illicit drugs.   Family History:  The patient's family history includes Alzheimer's disease in her mother; Breast cancer in an other family member; Breast cancer (age of onset: 76) in her sister; Coronary artery disease (age of onset: 75) in her son; Coronary artery disease (age of onset: 59) in her paternal uncle; Diabetes in her maternal grandmother; Heart attack in her maternal grandfather; Heart attack (age of onset: 46) in her father; Heart disease (age of onset: 46) in her father; Hiatal hernia in her sister; Hypertension in her brother; Lung cancer in her brother; Stroke in her son; Ulcers in her mother. There is no history of  Colon cancer, Esophageal cancer, Rectal cancer, or Stomach cancer.    ROS:  Please see the history of present illness.   Otherwise, review of systems are positive for none.   All other systems are reviewed and negative.    PHYSICAL EXAM: VS:  BP 148/78 mmHg  Pulse 101  Ht 5\' 3"  (1.6 m)  Wt 160 lb 12.8 oz (72.938 kg)  BMI 28.49 kg/m2  SpO2 97% , BMI Body mass index is 28.49 kg/(m^2). GEN: Well nourished, well developed, in no acute distress HEENT: normal Neck: no JVD, carotid bruits, or masses Cardiac: RRR; no murmurs, rubs, or gallops,no edema  Respiratory:  clear to auscultation bilaterally, normal work of breathing GI: soft, nontender, nondistended, + BS MS: no deformity or atrophy Skin: warm and dry, no rash Neuro:  Strength and sensation are intact Psych: euthymic mood, full affect   EKG:  EKG was ordered today and showed NSR with no ST changes and inferior infarct no changes from EKG 7/13    Recent Labs: 06/21/2014: ALT 19    Lipid Panel    Component Value Date/Time   CHOL 191 08/17/2013 0820   TRIG 115 06/21/2014 0829   TRIG 113.0 08/17/2013 0820   HDL 75.30 08/17/2013 0820   CHOLHDL 3 08/17/2013 0820   VLDL 22.6 08/17/2013 0820   LDLCALC 63 06/21/2014 0829   LDLCALC 93 08/17/2013 0820      Wt Readings from Last 3 Encounters:  10/21/14 160 lb 12.8 oz (72.938 kg)  04/22/14 165 lb (74.844 kg)  04/20/14 161 lb (73.029 kg)      ASSESSMENT AND PLAN:  1. ASCAD with remote PCI with no angina - continue ASA  2. Dyslipidemia - LDL at goal at 63 - continue Pravastatin/zetia      3.  Elevated Bp - SBP minimally elevated today but she did not sleep well last night- continue to follow - it was ok on last OV    Current medicines are reviewed at length with the patient today.  The patient does not have concerns regarding medicines.  The following changes have been made:  no change  Labs/ tests ordered today include: None  No orders of the defined types  were placed in this encounter.     Disposition:   FU with me in 1 year   Signed, Sally Margarita, MD  10/21/2014 10:13 AM    [facility] [place], [place], [place]  [address] Phone: [phone]; Fax: [phone]

## 2014-10-21 NOTE — Patient Instructions (Signed)
Your physician recommends that you continue on your current medications as directed. Please refer to the Current Medication list given to you today.  Your physician recommends that you return for FASTING lab work in: April, 2016.  Your physician wants you to follow-up in: 1 year with Dr. Radford Pax. You will receive a reminder letter in the mail two months in advance. If you don't receive a letter, please call our office to schedule the follow-up appointment.

## 2014-11-02 ENCOUNTER — Other Ambulatory Visit: Payer: Medicare Other

## 2014-11-09 ENCOUNTER — Ambulatory Visit: Payer: Medicare Other | Admitting: Pharmacist

## 2014-11-16 ENCOUNTER — Other Ambulatory Visit: Payer: Self-pay | Admitting: Cardiology

## 2014-11-22 ENCOUNTER — Other Ambulatory Visit: Payer: Self-pay | Admitting: Cardiology

## 2014-12-27 ENCOUNTER — Other Ambulatory Visit (INDEPENDENT_AMBULATORY_CARE_PROVIDER_SITE_OTHER): Payer: Medicare Other | Admitting: *Deleted

## 2014-12-27 DIAGNOSIS — E7800 Pure hypercholesterolemia, unspecified: Secondary | ICD-10-CM

## 2014-12-27 DIAGNOSIS — E785 Hyperlipidemia, unspecified: Secondary | ICD-10-CM

## 2014-12-27 LAB — LIPID PANEL
CHOLESTEROL: 165 mg/dL (ref 0–200)
HDL: 83.5 mg/dL (ref >39.00)
LDL CALC: 67 mg/dL (ref 0–99)
NonHDL: 81.5
TRIGLYCERIDES: 71 mg/dL (ref 0.0–149.0)
Total CHOL/HDL Ratio: 2
VLDL: 14.2 mg/dL (ref 0.0–40.0)

## 2015-02-01 ENCOUNTER — Ambulatory Visit: Payer: Medicare Other | Admitting: Nurse Practitioner

## 2015-03-08 ENCOUNTER — Ambulatory Visit (INDEPENDENT_AMBULATORY_CARE_PROVIDER_SITE_OTHER): Payer: Medicare Other | Admitting: Certified Nurse Midwife

## 2015-03-08 ENCOUNTER — Encounter: Payer: Self-pay | Admitting: Certified Nurse Midwife

## 2015-03-08 VITALS — BP 122/66 | HR 72 | Resp 20 | Ht 62.75 in | Wt 162.0 lb

## 2015-03-08 DIAGNOSIS — Z01419 Encounter for gynecological examination (general) (routine) without abnormal findings: Secondary | ICD-10-CM | POA: Diagnosis not present

## 2015-03-08 NOTE — Progress Notes (Signed)
80 y.o. G2P2002 Married  CaucasianCaucasian Fe here for annual exam. Menopausal denies vaginal bleeding, with some vaginal dryness.  Sees PCP yearly for aex and medication management, and sees cardiology yearly and does labs and urology yearly due to bladder cancer. Sees dermatology yearly also. No other health issues today,  No LMP recorded. Patient is postmenopausal.          Sexually active: No.  The current method of family planning is vasectomy.    Exercising: Yes.    machines,weights Smoker:  no  Health Maintenance: Pap:  10-24-10 neg MMG:  2/15 neg per patient has decided that she will not do this year, every other year  Colonoscopy:  2008 BMD:   2011 TDaP:  Up to date Labs: none Self breast exam: very occ   reports that she quit smoking about 30 years ago. Her smoking use included Cigarettes. She has never used smokeless tobacco. She reports that she drinks about 1.2 - 1.8 oz of alcohol per week. She reports that she does not use illicit drugs.  Past Medical History  Diagnosis Date  . External hemorrhoids without mention of complication   . Diverticulosis of colon (without mention of hemorrhage)   . Phlebitis   . Arthritis   . Cataract     left eye  . Hyperlipidemia   . Thyroid disease     hypothyroidism  . Anxiety   . Insomnia   . Vitamin D insufficiency     Dr. Joan Flores  . Osteopenia     mild  . Constipation     predominant IBS-miralax and citrucel daily-has used linzess in the past. Dr Macky Lower)  . CAD (coronary artery disease) 2/10    S/P PCI (Stent) of LAD. Normal LVF-No angina  . MSSA (methicillin susceptible Staphylococcus aureus) 2011    buttock abscess  . Colonic stricture 2013    and colon adenoma  . Squamous cell carcinoma, leg 2012    right, left  . IBS (irritable bowel syndrome)   . Bladder cancer 2012    Past Surgical History  Procedure Laterality Date  . Hemorrhoid surgery    . Total knee arthroplasty  06/2000    Left  . Left elbow surgery  x4    . Tonsillectomy    . Bladder cancer surgery  2012    Dr Zannie Cove  . Knee surgery      Age 39-30  . Coronary angioplasty with stent placement  10/2008    Current Outpatient Prescriptions  Medication Sig Dispense Refill  . aspirin EC 81 MG tablet Take 81 mg by mouth daily.    . Biotin 10 MG CAPS Take 1 capsule by mouth daily.    . Calcium Carbonate-Vit D-Min (CALCIUM 1200 PO) Take 1 capsule by mouth daily.     . Coenzyme Q10 (COQ-10) 200 MG CAPS Take 1 capsule by mouth daily.    Marland Kitchen levothyroxine (SYNTHROID, LEVOTHROID) 50 MCG tablet Take 50 mcg by mouth daily.    . Magnesium Hydroxide (MAGNESIA PO) Take 1 tablet by mouth daily.    . Multiple Vitamins-Minerals (CENTRUM CARDIO PO) Take 1 tablet by mouth daily.    . pravastatin (PRAVACHOL) 80 MG tablet TAKE 1 TABLET (80 MG TOTAL) BY MOUTH DAILY. 30 tablet 10  . psyllium (HYDROCIL/METAMUCIL) 95 % PACK Take 1 packet by mouth 2 (two) times daily. (Patient taking differently: Take 1 packet by mouth daily. )    . ZETIA 10 MG tablet TAKE 1 TABLET BY MOUTH DAILY 30  tablet 5  . ALPRAZolam (XANAX) 0.5 MG tablet Take 0.5 mg by mouth 2 (two) times daily as needed for anxiety. 1/2 tablet    . pantoprazole (PROTONIX) 40 MG tablet Take 40 mg by mouth as needed.    . zolpidem (AMBIEN) 10 MG tablet Take 10 mg by mouth At bedtime as needed. 1/2 tablet For sleep     No current facility-administered medications for this visit.    Family History  Problem Relation Age of Onset  . Heart disease Father 68    Died of MI  . Heart attack Father 64  . Breast cancer      aunt  . Hiatal hernia Sister   . Breast cancer Sister 59  . Ulcers Mother     peptic  . Alzheimer's disease Mother   . Hypertension Brother   . Lung cancer Brother   . Colon cancer Neg Hx   . Esophageal cancer Neg Hx   . Rectal cancer Neg Hx   . Stomach cancer Neg Hx   . Coronary artery disease Son 30    MI  . Stroke Son   . Coronary artery disease Paternal Uncle 6    Died  with CAD  . Diabetes Maternal Grandmother   . Heart attack Maternal Grandfather     70's    ROS:  Pertinent items are noted in HPI.  Otherwise, a comprehensive ROS was negative.  Exam:   BP 122/66 mmHg  Pulse 72  Resp 20  Ht 5' 2.75" (1.594 m)  Wt 162 lb (73.483 kg)  BMI 28.92 kg/m2 Height: 5' 2.75" (159.4 cm) Ht Readings from Last 3 Encounters:  03/08/15 5' 2.75" (1.594 m)  10/21/14 5\' 3"  (1.6 m)  04/22/14 5\' 3"  (1.6 m)    General appearance: alert, cooperative and appears stated age Head: Normocephalic, without obvious abnormality, atraumatic Neck: no adenopathy, supple, symmetrical, trachea midline and thyroid normal to inspection and palpation Lungs: clear to auscultation bilaterally Breasts: normal appearance, no masses or tenderness, No nipple retraction or dimpling, No nipple discharge or bleeding, No axillary or supraclavicular adenopathy Heart: regular rate and rhythm Abdomen: soft, non-tender; no masses,  no organomegaly Extremities: extremities normal, atraumatic, no cyanosis or edema Skin: Skin color, texture, turgor normal. No rashes or lesions Lymph nodes: Cervical, supraclavicular, and axillary nodes normal. No abnormal inguinal nodes palpated Neurologic: Grossly normal   Pelvic: External genitalia:  no lesions              Urethra:  normal appearing urethra with no masses, tenderness or lesions              Bartholin's and Skene's: normal                 Vagina: normal appearing vagina with normal color and discharge, no lesions              Cervix: normal, non tender, no lesions              Pap taken: No. Bimanual Exam:  Uterus:  normal size, contour, position, consistency, mobility, non-tender              Adnexa: normal adnexa and no mass, fullness, tenderness               Rectovaginal: Confirms               Anus:  normal sphincter tone, no lesions  Chaperone present: Yes  A:  Well Woman with normal exam  Menopausal no HRT  Atrophic  vaginitis  Hypertension/ hypercholesterolemia with cardiology management  Hypothyroid with PCP management.  History of Bladder cancer with Urology management yearly  P:   Reviewed health and wellness pertinent to exam  Aware of need to evaluate if vaginal bleeding  Discussed OTC use of coconut oil or olive oil, patient did nt have good results with estrogen products. Will advise if problems  Pap smear not taken   counseled on breast self exam, mammography screening stressed importance especially with sister history of breast cancer, feminine hygiene, adequate intake of calcium and vitamin D, diet and exercise  return annually or prn  An After Visit Summary was printed and given to the patient.

## 2015-03-08 NOTE — Patient Instructions (Signed)

## 2015-03-10 NOTE — Progress Notes (Signed)
Reviewed personally.  M. Suzanne Alvis Pulcini, MD.  

## 2015-04-03 ENCOUNTER — Encounter (HOSPITAL_COMMUNITY): Payer: Self-pay

## 2015-04-03 ENCOUNTER — Emergency Department (HOSPITAL_COMMUNITY): Payer: Medicare Other

## 2015-04-03 ENCOUNTER — Observation Stay (HOSPITAL_COMMUNITY)
Admission: EM | Admit: 2015-04-03 | Discharge: 2015-04-04 | Disposition: A | Discharge status: Home / Self Care | Payer: Medicare Other | Attending: Internal Medicine | Admitting: Internal Medicine

## 2015-04-03 DIAGNOSIS — Z8551 Personal history of malignant neoplasm of bladder: Secondary | ICD-10-CM | POA: Insufficient documentation

## 2015-04-03 DIAGNOSIS — I2510 Atherosclerotic heart disease of native coronary artery without angina pectoris: Secondary | ICD-10-CM | POA: Insufficient documentation

## 2015-04-03 DIAGNOSIS — E785 Hyperlipidemia, unspecified: Secondary | ICD-10-CM | POA: Diagnosis present

## 2015-04-03 DIAGNOSIS — Z87891 Personal history of nicotine dependence: Secondary | ICD-10-CM | POA: Insufficient documentation

## 2015-04-03 DIAGNOSIS — R030 Elevated blood-pressure reading, without diagnosis of hypertension: Secondary | ICD-10-CM | POA: Insufficient documentation

## 2015-04-03 DIAGNOSIS — E780 Pure hypercholesterolemia: Secondary | ICD-10-CM | POA: Insufficient documentation

## 2015-04-03 DIAGNOSIS — Z8249 Family history of ischemic heart disease and other diseases of the circulatory system: Secondary | ICD-10-CM | POA: Insufficient documentation

## 2015-04-03 DIAGNOSIS — E039 Hypothyroidism, unspecified: Secondary | ICD-10-CM | POA: Insufficient documentation

## 2015-04-03 DIAGNOSIS — Z7982 Long term (current) use of aspirin: Secondary | ICD-10-CM | POA: Insufficient documentation

## 2015-04-03 DIAGNOSIS — R42 Dizziness and giddiness: Secondary | ICD-10-CM | POA: Diagnosis not present

## 2015-04-03 DIAGNOSIS — H81399 Other peripheral vertigo, unspecified ear: Secondary | ICD-10-CM | POA: Diagnosis present

## 2015-04-03 DIAGNOSIS — IMO0001 Reserved for inherently not codable concepts without codable children: Secondary | ICD-10-CM | POA: Diagnosis present

## 2015-04-03 DIAGNOSIS — Z955 Presence of coronary angioplasty implant and graft: Secondary | ICD-10-CM | POA: Insufficient documentation

## 2015-04-03 LAB — CBC
HCT: 42.3 % (ref 36.0–46.0)
Hemoglobin: 14.4 g/dL (ref 12.0–15.0)
MCH: 32.3 pg (ref 26.0–34.0)
MCHC: 34 g/dL (ref 30.0–36.0)
MCV: 94.8 fL (ref 78.0–100.0)
PLATELETS: 199 10*3/uL (ref 150–400)
RBC: 4.46 MIL/uL (ref 3.87–5.11)
RDW: 12.8 % (ref 11.5–15.5)
WBC: 7.9 10*3/uL (ref 4.0–10.5)

## 2015-04-03 LAB — URINALYSIS, ROUTINE W REFLEX MICROSCOPIC
Bilirubin Urine: NEGATIVE
Glucose, UA: NEGATIVE mg/dL
Hgb urine dipstick: NEGATIVE
Ketones, ur: NEGATIVE mg/dL
Leukocytes, UA: NEGATIVE
NITRITE: NEGATIVE
Protein, ur: NEGATIVE mg/dL
SPECIFIC GRAVITY, URINE: 1.017 (ref 1.005–1.030)
UROBILINOGEN UA: 0.2 mg/dL (ref 0.0–1.0)
pH: 7 (ref 5.0–8.0)

## 2015-04-03 LAB — BASIC METABOLIC PANEL
ANION GAP: 6 (ref 5–15)
BUN: 21 mg/dL — ABNORMAL HIGH (ref 6–20)
CHLORIDE: 109 mmol/L (ref 101–111)
CO2: 25 mmol/L (ref 22–32)
CREATININE: 0.78 mg/dL (ref 0.44–1.00)
Calcium: 9.3 mg/dL (ref 8.9–10.3)
Glucose, Bld: 148 mg/dL — ABNORMAL HIGH (ref 65–99)
Potassium: 4.5 mmol/L (ref 3.5–5.1)
Sodium: 140 mmol/L (ref 135–145)

## 2015-04-03 LAB — TSH: TSH: 0.751 u[IU]/mL (ref 0.350–4.500)

## 2015-04-03 LAB — CBG MONITORING, ED: Glucose-Capillary: 122 mg/dL — ABNORMAL HIGH (ref 65–99)

## 2015-04-03 MED ORDER — ENOXAPARIN SODIUM 40 MG/0.4ML ~~LOC~~ SOLN
40.0000 mg | SUBCUTANEOUS | Status: DC
  Administered 2015-04-03: 40 mg via SUBCUTANEOUS

## 2015-04-03 MED ORDER — SODIUM CHLORIDE 0.9 % IV SOLN: INTRAVENOUS | Status: DC

## 2015-04-03 MED ORDER — PSYLLIUM 95 % PO PACK
1.0000 | PACK | Freq: Two times a day (BID) | ORAL | Status: DC
  Administered 2015-04-04: 1 via ORAL
  Filled 2015-04-03 (×3): qty 1

## 2015-04-03 MED ORDER — PRAVASTATIN SODIUM 40 MG PO TABS
80.0000 mg | ORAL_TABLET | Freq: Every day | ORAL | Status: DC
  Administered 2015-04-03: 80 mg via ORAL
  Filled 2015-04-03 (×2): qty 2

## 2015-04-03 MED ORDER — ZOLPIDEM TARTRATE 5 MG PO TABS
5.0000 mg | ORAL_TABLET | Freq: Every evening | ORAL | Status: DC | PRN
  Administered 2015-04-03: 5 mg via ORAL
  Filled 2015-04-03: qty 1

## 2015-04-03 MED ORDER — ONDANSETRON HCL 4 MG/2ML IJ SOLN: 4.0000 mg | Freq: Four times a day (QID) | INTRAMUSCULAR | Status: DC | PRN

## 2015-04-03 MED ORDER — METOCLOPRAMIDE HCL 5 MG/ML IJ SOLN
10.0000 mg | Freq: Once | INTRAMUSCULAR | Status: AC
  Administered 2015-04-03: 10 mg via INTRAVENOUS
  Filled 2015-04-03: qty 2

## 2015-04-03 MED ORDER — ONDANSETRON HCL 4 MG PO TABS: 4.0000 mg | ORAL_TABLET | Freq: Four times a day (QID) | ORAL | Status: DC | PRN

## 2015-04-03 MED ORDER — AMLODIPINE BESYLATE 5 MG PO TABS
5.0000 mg | ORAL_TABLET | Freq: Every day | ORAL | Status: DC
  Administered 2015-04-03: 5 mg via ORAL
  Filled 2015-04-03 (×2): qty 1

## 2015-04-03 MED ORDER — HYDRALAZINE HCL 25 MG PO TABS: 25.0000 mg | ORAL_TABLET | Freq: Four times a day (QID) | ORAL | Status: DC | PRN

## 2015-04-03 MED ORDER — ASPIRIN EC 81 MG PO TBEC
81.0000 mg | DELAYED_RELEASE_TABLET | Freq: Every day | ORAL | Status: DC
  Administered 2015-04-03: 81 mg via ORAL
  Filled 2015-04-03 (×2): qty 1

## 2015-04-03 MED ORDER — LEVOTHYROXINE SODIUM 50 MCG PO TABS: 50.0000 ug | ORAL_TABLET | Freq: Every day | ORAL | Status: DC

## 2015-04-03 MED ORDER — MECLIZINE HCL 25 MG PO TABS
50.0000 mg | ORAL_TABLET | Freq: Once | ORAL | Status: AC
  Administered 2015-04-03: 50 mg via ORAL
  Filled 2015-04-03: qty 2

## 2015-04-03 MED ORDER — ALPRAZOLAM 0.5 MG PO TABS: 0.5000 mg | ORAL_TABLET | Freq: Two times a day (BID) | ORAL | Status: DC | PRN

## 2015-04-03 MED ORDER — DIAZEPAM 5 MG/ML IJ SOLN
2.5000 mg | Freq: Once | INTRAMUSCULAR | Status: AC
  Administered 2015-04-03: 2.5 mg via INTRAVENOUS
  Filled 2015-04-03: qty 2

## 2015-04-03 MED ORDER — ACETAMINOPHEN 650 MG RE SUPP
650.0000 mg | Freq: Four times a day (QID) | RECTAL | Status: DC | PRN
Start: 2015-04-03 — End: 2015-04-04

## 2015-04-03 MED ORDER — EZETIMIBE 10 MG PO TABS
10.0000 mg | ORAL_TABLET | Freq: Every day | ORAL | Status: DC
  Administered 2015-04-03: 10 mg via ORAL
  Filled 2015-04-03 (×2): qty 1

## 2015-04-03 MED ORDER — ACETAMINOPHEN 325 MG PO TABS: 650.0000 mg | ORAL_TABLET | Freq: Four times a day (QID) | ORAL | Status: DC | PRN

## 2015-04-03 MED ORDER — MECLIZINE HCL 25 MG PO TABS
25.0000 mg | ORAL_TABLET | Freq: Three times a day (TID) | ORAL | Status: DC | PRN
  Administered 2015-04-03: 25 mg via ORAL
  Filled 2015-04-03 (×2): qty 1

## 2015-04-03 NOTE — ED Notes (Signed)
Meal tray ordered for pt  

## 2015-04-03 NOTE — H&P (Signed)
PCP:   Kandice Hams, MD   Chief Complaint:  Dizziness  HPI: 80 year old female who   has a past medical history of External hemorrhoids without mention of complication; Diverticulosis of colon (without mention of hemorrhage); Phlebitis; Arthritis; Cataract; Hyperlipidemia; Thyroid disease; Anxiety; Insomnia; Vitamin D insufficiency; Osteopenia; Constipation; CAD (coronary artery disease) (2/10); MSSA (methicillin susceptible Staphylococcus aureus) (2011); Colonic stricture (2013); Squamous cell carcinoma, leg (2012); IBS (irritable bowel syndrome); and Bladder cancer (2012). Today presents to the hospital with chief complaint of dizziness which started this morning. Patient also had nausea and one episode of vomiting. Patient says that she feels pressure in the head. She denies chest pain, no shortness of breath. No history of headaches in the past. Patient denies any history of hypertension but her systolic blood pressure has been consistently elevated since she came to the ED in 160s to 170s. MRI brain was done which was negative for stroke. Patient recently was treated for right ear infection by her primary care provider. Patient says that she was given eardrops which she used for 1 week. She denies any ear pain at this time  Allergies:   Allergies  Allergen Reactions  . Benadryl [Diphenhydramine Hcl] Other (See Comments)    hyperactivity  . Crestor [Rosuvastatin] Other (See Comments)    Muscle Aches  . Lipitor [Atorvastatin] Other (See Comments)    Muscle aches  . Morphine And Related Nausea And Vomiting  . Tramadol Nausea And Vomiting  . Cortisone Rash  . Sulfonamide Derivatives Hives      Past Medical History  Diagnosis Date  . External hemorrhoids without mention of complication   . Diverticulosis of colon (without mention of hemorrhage)   . Phlebitis   . Arthritis   . Cataract     left eye  . Hyperlipidemia   . Thyroid disease     hypothyroidism  . Anxiety   .  Insomnia   . Vitamin D insufficiency     Dr. Joan Flores  . Osteopenia     mild  . Constipation     predominant IBS-miralax and citrucel daily-has used linzess in the past. Dr Macky Lower)  . CAD (coronary artery disease) 2/10    S/P PCI (Stent) of LAD. Normal LVF-No angina  . MSSA (methicillin susceptible Staphylococcus aureus) 2011    buttock abscess  . Colonic stricture 2013    and colon adenoma  . Squamous cell carcinoma, leg 2012    right, left  . IBS (irritable bowel syndrome)   . Bladder cancer 2012    Past Surgical History  Procedure Laterality Date  . Hemorrhoid surgery    . Total knee arthroplasty  06/2000    Left  . Left elbow surgery x4    . Tonsillectomy    . Bladder cancer surgery  2012    Dr Zannie Cove  . Knee surgery      Age 27-30  . Coronary angioplasty with stent placement  10/2008    Prior to Admission medications   Medication Sig Start Date End Date Taking? Authorizing Provider  aspirin EC 81 MG tablet Take 81 mg by mouth daily.   Yes Historical Provider, MD  Biotin 10 MG CAPS Take 1 capsule by mouth daily.   Yes Historical Provider, MD  Calcium Carbonate-Vit D-Min (CALCIUM 1200 PO) Take 1 capsule by mouth daily.    Yes Historical Provider, MD  Coenzyme Q10 (COQ-10) 200 MG CAPS Take 1 capsule by mouth daily.   Yes Historical Provider, MD  levothyroxine (  SYNTHROID, LEVOTHROID) 50 MCG tablet Take 50 mcg by mouth daily.   Yes Historical Provider, MD  Magnesium Hydroxide (MAGNESIA PO) Take 1 tablet by mouth daily.   Yes Historical Provider, MD  Multiple Vitamins-Minerals (CENTRUM CARDIO PO) Take 1 tablet by mouth daily.   Yes Historical Provider, MD  pravastatin (PRAVACHOL) 80 MG tablet TAKE 1 TABLET (80 MG TOTAL) BY MOUTH DAILY. 11/17/14  Yes Sueanne Margarita, MD  psyllium (HYDROCIL/METAMUCIL) 95 % PACK Take 1 packet by mouth 2 (two) times daily. Patient taking differently: Take 1 packet by mouth daily.  08/26/13  Yes Sueanne Margarita, MD  ZETIA 10 MG tablet TAKE 1  TABLET BY MOUTH DAILY 11/22/14  Yes Sueanne Margarita, MD  zolpidem (AMBIEN) 10 MG tablet Take 5 mg by mouth At bedtime as needed for sleep. 1/2 tablet For sleep 01/25/12  Yes Historical Provider, MD  ALPRAZolam Duanne Moron) 0.5 MG tablet Take 0.5 mg by mouth 2 (two) times daily as needed. 03/31/15   Historical Provider, MD    Social History:  reports that she quit smoking about 30 years ago. Her smoking use included Cigarettes. She has never used smokeless tobacco. She reports that she drinks about 1.2 - 1.8 oz of alcohol per week. She reports that she does not use illicit drugs.  Family History  Problem Relation Age of Onset  . Heart disease Father 21    Died of MI  . Heart attack Father 20  . Breast cancer      aunt  . Hiatal hernia Sister   . Breast cancer Sister 89  . Ulcers Mother     peptic  . Alzheimer's disease Mother   . Hypertension Brother   . Lung cancer Brother   . Colon cancer Neg Hx   . Esophageal cancer Neg Hx   . Rectal cancer Neg Hx   . Stomach cancer Neg Hx   . Coronary artery disease Son 85    MI  . Stroke Son   . Coronary artery disease Paternal Uncle 81    Died with CAD  . Diabetes Maternal Grandmother   . Heart attack Maternal Grandfather     70's     All the positives are listed in BOLD  Review of Systems:  HEENT: Headache, blurred vision, runny nose, sore throat Neck: Hypothyroidism, hyperthyroidism,,lymphadenopathy Chest : Shortness of breath, history of COPD, Asthma Heart : Chest pain, history of coronary arterey disease GI:  Nausea, vomiting, diarrhea, constipation, GERD GU: Dysuria, urgency, frequency of urination, hematuria Neuro: Stroke, seizures, syncope Psych: Depression, anxiety, hallucinations   Physical Exam: Blood pressure 149/56, pulse 83, temperature 98.1 F (36.7 C), temperature source Oral, resp. rate 16, SpO2 100 %. Constitutional:   Patient is a well-developed and well-nourished female* in no acute distress and cooperative with  exam. Head: Normocephalic and atraumatic Mouth: Mucus membranes moist Eyes: PERRL, EOMI, conjunctivae normal Neck: Supple, No Thyromegaly Cardiovascular: RRR, S1 normal, S2 normal Pulmonary/Chest: CTAB, no wheezes, rales, or rhonchi Abdominal: Soft. Non-tender, non-distended, bowel sounds are normal, no masses, organomegaly, or guarding present.  Neurological: A&O x3, Strength is normal and symmetric bilaterally, cranial nerve II-XII are grossly intact, no focal motor deficit, sensory intact to light touch bilaterally.  Extremities : No Cyanosis, Clubbing or Edema  Labs on Admission:  Basic Metabolic Panel:  Recent Labs Lab 04/03/15 1020  NA 140  K 4.5  CL 109  CO2 25  GLUCOSE 148*  BUN 21*  CREATININE 0.78  CALCIUM 9.3  CBC:  Recent Labs Lab 04/03/15 1020  WBC 7.9  HGB 14.4  HCT 42.3  MCV 94.8  PLT 199    CBG:  Recent Labs Lab 04/03/15 1135  GLUCAP 122*    Radiological Exams on Admission: Mr Brain Wo Contrast  04/03/2015   CLINICAL DATA:  80 year old female with hyperlipidemia presenting with vertigo. History of bladder cancer. Initial encounter.  EXAM: MRI HEAD WITHOUT CONTRAST  TECHNIQUE: Multiplanar, multiecho pulse sequences of the brain and surrounding structures were obtained without intravenous contrast.  COMPARISON:  None.  FINDINGS: No acute infarct.  No intracranial hemorrhage.  Minimal white matter type changes less than typically seen in a patient of this age.  No hydrocephalus.  No intracranial mass lesion noted on this unenhanced exam.  Major intracranial vascular structures are patent.  Partially empty non expanded sella incidentally noted.  Cervical medullary junction and pineal region unremarkable.  Post lens replacement otherwise orbital structures unremarkable.  Minimal mucosal thickening ethmoid sinus air cells and maxillary sinus.  IMPRESSION: No acute infarct.  Please see above.   Electronically Signed   By: Genia Del M.D.   On: 04/03/2015  12:39    EKG: Independently reviewed. Normal sinus rhythm   Assessment/Plan Active Problems:   Hyperlipidemia   Elevated blood pressure   Peripheral vertigo   Dizziness  Dizziness Likely from inner ear, as patient has recently been treated for ear infection. Will continue meclizine 25 mg 3 times a day when necessary If no improvement consider ENT consultation in a.m.  Elevated blood pressure Patient has had consistent high readings of blood pressure in the ED. We'll start low-dose amlodipine 5 mg by mouth daily and hydralazine 25 mg every 6 hours when necessary. If patient starts feeling better with amlodipine and blood pressure is controlled can be discharged on the same.  Hyperlipidemia Continue Pravachol and Zetia.  Code status: Full code   Family discussion: Admission, patients condition and plan of care including tests being ordered have been discussed with the patient and her husband at bedside who indicate understanding and agree with the plan and Code Status.   Time Spent on Admission: 55 minutes  [facility] Pager: [phone] 04/03/2015, 5:35 PM  If 7PM-7AM, please contact night-coverage  [email or web address]  Password [email or web address]

## 2015-04-03 NOTE — ED Notes (Signed)
Pt was unsteady while walking. Pt stated she still felt dizzy.

## 2015-04-03 NOTE — ED Provider Notes (Signed)
CSN: [id number]     Arrival date & time 04/03/15  0914 History   First MD Initiated Contact with Patient 04/03/15 0914     Chief Complaint  Patient presents with  . Dizziness     (Consider location/radiation/quality/duration/timing/severity/associated sxs/prior Treatment) HPI Comments: Patient with history of high cholesterol and coronary artery disease status post stenting -- presents with acute onset of vertigo this morning. Patient describes a sensation of spinning. This was associated with nausea and one episode of vomiting. Patient states that she feels some pressure in the back of her head. Otherwise she denies chest pain or shortness of breath. No severe headaches or injuries. No treatments prior to arrival. Patient was able to get up and ambulatory to the bathroom but had to call to her husband for assistance to help her back to bed. EMS was called. Symptoms are not made worse with position. Patient was treated for an ear infection a couple of weeks ago with an antibiotic. No history of stroke. Patient denies signs of stroke including: facial droop, slurred speech, aphasia, weakness/numbness in extremities. The onset of this condition was acute. The course is constant. Aggravating factors: none. Alleviating factors: closing her eyes.    Patient is a 80 y.o. female presenting with dizziness. The history is provided by the patient and medical records.  Dizziness Associated symptoms: no chest pain, no headaches, no nausea, no shortness of breath, no tinnitus, no vomiting and no weakness     Past Medical History  Diagnosis Date  . External hemorrhoids without mention of complication   . Diverticulosis of colon (without mention of hemorrhage)   . Phlebitis   . Arthritis   . Cataract     left eye  . Hyperlipidemia   . Thyroid disease     hypothyroidism  . Anxiety   . Insomnia   . Vitamin D insufficiency     Dr. Joan Flores  . Osteopenia     mild  . Constipation     predominant  IBS-miralax and citrucel daily-has used linzess in the past. Dr Macky Lower)  . CAD (coronary artery disease) 2/10    S/P PCI (Stent) of LAD. Normal LVF-No angina  . MSSA (methicillin susceptible Staphylococcus aureus) 2011    buttock abscess  . Colonic stricture 2013    and colon adenoma  . Squamous cell carcinoma, leg 2012    right, left  . IBS (irritable bowel syndrome)   . Bladder cancer 2012   Past Surgical History  Procedure Laterality Date  . Hemorrhoid surgery    . Total knee arthroplasty  06/2000    Left  . Left elbow surgery x4    . Tonsillectomy    . Bladder cancer surgery  2012    Dr Zannie Cove  . Knee surgery      Age 43-30  . Coronary angioplasty with stent placement  10/2008   Family History  Problem Relation Age of Onset  . Heart disease Father 28    Died of MI  . Heart attack Father 63  . Breast cancer      aunt  . Hiatal hernia Sister   . Breast cancer Sister 37  . Ulcers Mother     peptic  . Alzheimer's disease Mother   . Hypertension Brother   . Lung cancer Brother   . Colon cancer Neg Hx   . Esophageal cancer Neg Hx   . Rectal cancer Neg Hx   . Stomach cancer Neg Hx   . Coronary artery  disease Son 68    MI  . Stroke Son   . Coronary artery disease Paternal Uncle 59    Died with CAD  . Diabetes Maternal Grandmother   . Heart attack Maternal Grandfather     70's   History  Substance Use Topics  . Smoking status: Former Smoker    Types: Cigarettes    Quit date: 09/10/1984  . Smokeless tobacco: Never Used     Comment: Quit tobacco 30 years ago.  . Alcohol Use: 1.2 - 1.8 oz/week    2-3 Glasses of wine per week     Comment: wine   OB History    Gravida Para Term Preterm AB TAB SAB Ectopic Multiple Living   2 2 2       2      Review of Systems  Constitutional: Negative for fatigue.  HENT: Negative for ear pain and tinnitus.   Eyes: Negative for photophobia, pain and visual disturbance.  Respiratory: Negative for shortness of breath.    Cardiovascular: Negative for chest pain.  Gastrointestinal: Negative for nausea and vomiting.  Musculoskeletal: Positive for gait problem. Negative for back pain and neck pain.  Skin: Negative for wound.  Neurological: Positive for dizziness. Negative for weakness, light-headedness, numbness and headaches.  Psychiatric/Behavioral: Negative for confusion and decreased concentration.      Allergies  Morphine and related; Tramadol; Benadryl; Crestor; Lipitor; Sulfonamide derivatives; and Cortisone  Home Medications   Prior to Admission medications   Medication Sig Start Date End Date Taking? Authorizing Provider  ALPRAZolam Duanne Moron) 0.5 MG tablet Take 0.5 mg by mouth 2 (two) times daily as needed for anxiety. 1/2 tablet    Historical Provider, MD  aspirin EC 81 MG tablet Take 81 mg by mouth daily.    Historical Provider, MD  Biotin 10 MG CAPS Take 1 capsule by mouth daily.    Historical Provider, MD  Calcium Carbonate-Vit D-Min (CALCIUM 1200 PO) Take 1 capsule by mouth daily.     Historical Provider, MD  Coenzyme Q10 (COQ-10) 200 MG CAPS Take 1 capsule by mouth daily.    Historical Provider, MD  levothyroxine (SYNTHROID, LEVOTHROID) 50 MCG tablet Take 50 mcg by mouth daily.    Historical Provider, MD  Magnesium Hydroxide (MAGNESIA PO) Take 1 tablet by mouth daily.    Historical Provider, MD  Multiple Vitamins-Minerals (CENTRUM CARDIO PO) Take 1 tablet by mouth daily.    Historical Provider, MD  pantoprazole (PROTONIX) 40 MG tablet Take 40 mg by mouth as needed.    Historical Provider, MD  pravastatin (PRAVACHOL) 80 MG tablet TAKE 1 TABLET (80 MG TOTAL) BY MOUTH DAILY. 11/17/14   Sueanne Margarita, MD  psyllium (HYDROCIL/METAMUCIL) 95 % PACK Take 1 packet by mouth 2 (two) times daily. Patient taking differently: Take 1 packet by mouth daily.  08/26/13   Sueanne Margarita, MD  ZETIA 10 MG tablet TAKE 1 TABLET BY MOUTH DAILY 11/22/14   Sueanne Margarita, MD  zolpidem (AMBIEN) 10 MG tablet Take 10 mg by  mouth At bedtime as needed. 1/2 tablet For sleep 01/25/12   Historical Provider, MD   BP 164/70 mmHg  Pulse 72  Resp 12  SpO2 96% Physical Exam  Constitutional: She is oriented to person, place, and time. She appears well-developed and well-nourished.  HENT:  Head: Normocephalic and atraumatic.  Right Ear: Tympanic membrane, external ear and ear canal normal.  Left Ear: Tympanic membrane, external ear and ear canal normal.  Nose: Nose normal.  Mouth/Throat: Uvula is midline, oropharynx is clear and moist and mucous membranes are normal. Mucous membranes are not dry.  Eyes: Conjunctivae, EOM and lids are normal. Pupils are equal, round, and reactive to light. Right eye exhibits no nystagmus. Left eye exhibits no nystagmus.  Nystagmus with left-ward gaze.   Neck: Trachea normal and normal range of motion. Neck supple. Normal carotid pulses and no JVD present. No muscular tenderness present. Carotid bruit is not present. No tracheal deviation present.  Cardiovascular: Normal rate, regular rhythm, S1 normal, S2 normal, normal heart sounds and intact distal pulses.  Exam reveals no decreased pulses.   No murmur heard. Pulmonary/Chest: Effort normal and breath sounds normal. No respiratory distress. She has no wheezes. She has no rales. She exhibits no tenderness.  Abdominal: Soft. Normal aorta and bowel sounds are normal. There is no tenderness. There is no rebound and no guarding.  Musculoskeletal: Normal range of motion.       Cervical back: She exhibits normal range of motion, no tenderness and no bony tenderness.  Neurological: She is alert and oriented to person, place, and time. She has normal strength and normal reflexes. No cranial nerve deficit or sensory deficit. She displays a negative Romberg sign. Coordination normal. GCS eye subscore is 4. GCS verbal subscore is 5. GCS motor subscore is 6.  Normal finger-to-nose.   Skin: Skin is warm and dry. She is not diaphoretic. No cyanosis. No  pallor.  Psychiatric: She has a normal mood and affect.  Nursing note and vitals reviewed.   ED Course  Procedures (including critical care time) Labs Review Labs Reviewed  BASIC METABOLIC PANEL - Abnormal; Notable for the following:    Glucose, Bld 148 (*)    BUN 21 (*)    All other components within normal limits  CBG MONITORING, ED - Abnormal; Notable for the following:    Glucose-Capillary 122 (*)    All other components within normal limits  CBC  URINALYSIS, ROUTINE W REFLEX MICROSCOPIC (NOT AT [facility])    Imaging Review Mr Brain Wo Contrast  04/03/2015   CLINICAL DATA:  80 year old female with hyperlipidemia presenting with vertigo. History of bladder cancer. Initial encounter.  EXAM: MRI HEAD WITHOUT CONTRAST  TECHNIQUE: Multiplanar, multiecho pulse sequences of the brain and surrounding structures were obtained without intravenous contrast.  COMPARISON:  None.  FINDINGS: No acute infarct.  No intracranial hemorrhage.  Minimal white matter type changes less than typically seen in a patient of this age.  No hydrocephalus.  No intracranial mass lesion noted on this unenhanced exam.  Major intracranial vascular structures are patent.  Partially empty non expanded sella incidentally noted.  Cervical medullary junction and pineal region unremarkable.  Post lens replacement otherwise orbital structures unremarkable.  Minimal mucosal thickening ethmoid sinus air cells and maxillary sinus.  IMPRESSION: No acute infarct.  Please see above.   Electronically Signed   By: Genia Del M.D.   On: 04/03/2015 12:39     EKG Interpretation   Date/Time:  Sunday April 03 2015 09:23:18 EDT Ventricular Rate:  89 PR Interval:  217 QRS Duration: 91 QT Interval:  372 QTC Calculation: 453 R Axis:   86 Text Interpretation:  Sinus rhythm Borderline prolonged PR interval  Borderline right axis deviation Low voltage, precordial leads Minimal ST  depression, lateral leads No significant change since last  tracing  Confirmed by YAO  MD, DAVID ([id number]) on 04/03/2015 9:54:25 AM      9:32 AM Patient seen and examined. Work-up  initiated. Medications ordered.   Vital signs reviewed and are as follows: BP 164/70 mmHg  Pulse 72  Resp 12  SpO2 96%   11:28 AM No improvement with meclizine or IV reglan to this point. Awaiting MRI.   1:44 PM Patient updated on MR results. She has not had any relief to this point. She was only able to walk a few steps in the room before having to get back in bed. Valium ordered. Will reassess. May need admission for intractable symptoms.  3:28 PM No change with valium. Spoke with [facility] who will see. Pt agrees with plan.   BP 138/85 mmHg  Pulse 89  Temp(Src) 97.8 F (36.6 C) (Oral)  Resp 18  SpO2 99%   MDM   Final diagnoses:  Peripheral vertigo, unspecified laterality   Admit.     Carlisle Cater, PA-C 04/03/15 [place] Yao, MD 04/04/15 [phone]

## 2015-04-03 NOTE — ED Notes (Signed)
Pt reports no relief of dizziness at this time.

## 2015-04-03 NOTE — ED Notes (Signed)
Pt reports she woke up at 0600 with dizziness and nausea.  Pt denies any chest pain, weakness or HA.  Pt sts "My head just feels really really heavy."  Pt reports everything was normal last night when she went to bed.  Pt denies pain.  Pt given 4mg  Zofran PTA with no relief.

## 2015-04-04 DIAGNOSIS — H81393 Other peripheral vertigo, bilateral: Secondary | ICD-10-CM | POA: Diagnosis not present

## 2015-04-04 DIAGNOSIS — R42 Dizziness and giddiness: Secondary | ICD-10-CM | POA: Diagnosis not present

## 2015-04-04 MED ORDER — CETIRIZINE HCL 10 MG PO TABS
10.0000 mg | ORAL_TABLET | Freq: Every day | ORAL | Status: DC
Start: 2015-04-04 — End: 2015-11-23

## 2015-04-04 MED ORDER — MECLIZINE HCL 12.5 MG PO TABS: 12.5000 mg | ORAL_TABLET | Freq: Three times a day (TID) | ORAL | Status: DC | PRN

## 2015-04-04 NOTE — Discharge Summary (Signed)
Discharge Summary  [name] GTX:[id number] DOB: 07-06-1935  PCP: Kandice Hams, MD  Admit date: 04/03/2015 Discharge date: 04/04/2015  Time spent: <78mins  Recommendations for Outpatient Follow-up:  1. F/u with PMD Dr. Delfina Redwood within two weeks for hospital discharge follow up 2. F/u with ENT Dr. Lucia Gaskins within two weeks for dizziness.  Discharge Diagnoses:  Active Hospital Problems   Diagnosis Date Noted  . Peripheral vertigo 04/03/2015  . Dizziness 04/03/2015  . Elevated blood pressure 10/21/2013  . Hyperlipidemia 08/26/2013    Resolved Hospital Problems   Diagnosis Date Noted Date Resolved  No resolved problems to display.    Discharge Condition: stable  Diet recommendation: heart healthy  There were no vitals filed for this visit.  History of present illness:  80 year old female who  has a past medical history of External hemorrhoids without mention of complication; Diverticulosis of colon (without mention of hemorrhage); Phlebitis; Arthritis; Cataract; Hyperlipidemia; Thyroid disease; Anxiety; Insomnia; Vitamin D insufficiency; Osteopenia; Constipation; CAD (coronary artery disease) (2/10); MSSA (methicillin susceptible Staphylococcus aureus) (2011); Colonic stricture (2013); Squamous cell carcinoma, leg (2012); IBS (irritable bowel syndrome); and Bladder cancer (2012). Today presents to the hospital with chief complaint of dizziness which started this morning. Patient also had nausea and one episode of vomiting. Patient says that she feels pressure in the head. She denies chest pain, no shortness of breath. No history of headaches in the past. Patient denies any history of hypertension but her systolic blood pressure has been consistently elevated since she came to the ED in 160s to 170s. MRI brain was done which was negative for stroke. Patient recently was treated for right ear infection by her primary care provider. Patient says that she was given eardrops which she  used for 1 week. She denies any ear pain at this time  Hospital Course:  Active Problems:   Hyperlipidemia   Elevated blood pressure   Peripheral vertigo   Dizziness  Dizziness Likely from inner ear, as patient has recently been treated for ear infection. MRI brain no acute findings, labs unremarkable, EKG wnl, patient denies ear pain, no fever, does has fullness in both ear, no tinnitus, denies hearing difficulties,  Much improved D/c home with zyrtec and prn meclizine 12.5 mg 3 times and ENT follow up within two weeks. Advised patient should refrain from driving, she expressed understanding.   Elevated blood pressure Patient has had consistent high readings of blood pressure in the ED. Received  low-dose amlodipine 5 mg by mouth x1 in the ED, bp stable prior to discharge, patient decline further dose of norvasc, Instructed her to monitor blood pressure at home and follow up with pmd.   Hyperlipidemia Continue Pravachol and Zetia.  Code status: Full code   Procedures:  MRI brain  Consultations:  none  Discharge Exam: BP 125/65 mmHg  Pulse 78  Temp(Src) 97.8 F (36.6 C) (Oral)  Resp 16  SpO2 98%  General: AAOx4 Cardiovascular: RRR Respiratory: CTABL  Discharge Instructions You were cared for by a hospitalist during your hospital stay. If you have any questions about your discharge medications or the care you received while you were in the hospital after you are discharged, you can call the unit and asked to speak with the hospitalist on call if the hospitalist that took care of you is not available. Once you are discharged, your primary care physician will handle any further medical issues. Please note that NO REFILLS for any discharge medications will be authorized once you are discharged,  as it is imperative that you return to your primary care physician (or establish a relationship with a primary care physician if you do not have one) for your aftercare needs so  that they can reassess your need for medications and monitor your lab values.      Discharge Instructions    Diet - low sodium heart healthy    Complete by:  As directed      Increase activity slowly    Complete by:  As directed             Medication List    TAKE these medications        ALPRAZolam 0.5 MG tablet  Commonly known as:  XANAX  Take 0.5 mg by mouth 2 (two) times daily as needed.     aspirin EC 81 MG tablet  Take 81 mg by mouth daily.     Biotin 10 MG Caps  Take 1 capsule by mouth daily.     CALCIUM 1200 PO  Take 1 capsule by mouth daily.     CENTRUM CARDIO PO  Take 1 tablet by mouth daily.     cetirizine 10 MG tablet  Commonly known as:  ZYRTEC  Take 1 tablet (10 mg total) by mouth daily.     CoQ-10 200 MG Caps  Take 1 capsule by mouth daily.     levothyroxine 50 MCG tablet  Commonly known as:  SYNTHROID, LEVOTHROID  Take 50 mcg by mouth daily.     MAGNESIA PO  Take 1 tablet by mouth daily.     meclizine 12.5 MG tablet  Commonly known as:  ANTIVERT  Take 1 tablet (12.5 mg total) by mouth 3 (three) times daily as needed for dizziness.     pravastatin 80 MG tablet  Commonly known as:  PRAVACHOL  TAKE 1 TABLET (80 MG TOTAL) BY MOUTH DAILY.     psyllium 95 % Pack  Commonly known as:  HYDROCIL/METAMUCIL  Take 1 packet by mouth 2 (two) times daily.     ZETIA 10 MG tablet  Generic drug:  ezetimibe  TAKE 1 TABLET BY MOUTH DAILY     zolpidem 10 MG tablet  Commonly known as:  AMBIEN  Take 5 mg by mouth At bedtime as needed for sleep. 1/2 tablet For sleep       Allergies  Allergen Reactions  . Benadryl [Diphenhydramine Hcl] Other (See Comments)    hyperactivity  . Crestor [Rosuvastatin] Other (See Comments)    Muscle Aches  . Lipitor [Atorvastatin] Other (See Comments)    Muscle aches  . Morphine And Related Nausea And Vomiting  . Tramadol Nausea And Vomiting  . Cortisone Rash  . Sulfonamide Derivatives Hives   Follow-up Information     Follow up with Melony Overly, MD In 2 weeks.   Specialty:  Otolaryngology   Why:  dizziness Leavy Cella infection   Contact information:   [place] [address] [phone]       Follow up with Kandice Hams, MD On 04/14/2015.   Specialty:  Internal Medicine   Why:  establish care/ hospital discharge follow up   Contact information:   [address]  [place] [address] [phone]        The results of significant diagnostics from this hospitalization (including imaging, microbiology, ancillary and laboratory) are listed below for reference.    Significant Diagnostic Studies: Mr Herby Abraham Contrast  04/03/2015   CLINICAL DATA:  80 year old female  with hyperlipidemia presenting with vertigo. History of bladder cancer. Initial encounter.  EXAM: MRI HEAD WITHOUT CONTRAST  TECHNIQUE: Multiplanar, multiecho pulse sequences of the brain and surrounding structures were obtained without intravenous contrast.  COMPARISON:  None.  FINDINGS: No acute infarct.  No intracranial hemorrhage.  Minimal white matter type changes less than typically seen in a patient of this age.  No hydrocephalus.  No intracranial mass lesion noted on this unenhanced exam.  Major intracranial vascular structures are patent.  Partially empty non expanded sella incidentally noted.  Cervical medullary junction and pineal region unremarkable.  Post lens replacement otherwise orbital structures unremarkable.  Minimal mucosal thickening ethmoid sinus air cells and maxillary sinus.  IMPRESSION: No acute infarct.  Please see above.   Electronically Signed   By: Genia Del M.D.   On: 04/03/2015 12:39    Microbiology: No results found for this or any previous visit (from the past 240 hour(s)).   Labs: Basic Metabolic Panel:  Recent Labs Lab 04/03/15 1020  NA 140  K 4.5  CL 109  CO2 25  GLUCOSE 148*  BUN 21*  CREATININE 0.78  CALCIUM 9.3   Liver Function Tests: No  results for input(s): AST, ALT, ALKPHOS, BILITOT, PROT, ALBUMIN in the last 168 hours. No results for input(s): LIPASE, AMYLASE in the last 168 hours. No results for input(s): AMMONIA in the last 168 hours. CBC:  Recent Labs Lab 04/03/15 1020  WBC 7.9  HGB 14.4  HCT 42.3  MCV 94.8  PLT 199   Cardiac Enzymes: No results for input(s): CKTOTAL, CKMB, CKMBINDEX, TROPONINI in the last 168 hours. BNP: BNP (last 3 results) No results for input(s): BNP in the last 8760 hours.  ProBNP (last 3 results) No results for input(s): PROBNP in the last 8760 hours.  CBG:  Recent Labs Lab 04/03/15 1135  GLUCAP 122*       Signed:  Karlis Cregg MD, PhD  [facility] 04/04/2015, 10:34 AM

## 2015-05-24 ENCOUNTER — Other Ambulatory Visit: Payer: Self-pay | Admitting: Cardiology

## 2015-09-13 DIAGNOSIS — Z111 Encounter for screening for respiratory tuberculosis: Secondary | ICD-10-CM | POA: Diagnosis not present

## 2015-09-21 DIAGNOSIS — Z79899 Other long term (current) drug therapy: Secondary | ICD-10-CM | POA: Diagnosis not present

## 2015-09-22 ENCOUNTER — Other Ambulatory Visit: Payer: Self-pay | Admitting: Cardiology

## 2015-09-30 DIAGNOSIS — E039 Hypothyroidism, unspecified: Secondary | ICD-10-CM | POA: Insufficient documentation

## 2015-09-30 DIAGNOSIS — G4700 Insomnia, unspecified: Secondary | ICD-10-CM | POA: Insufficient documentation

## 2015-09-30 DIAGNOSIS — Z8551 Personal history of malignant neoplasm of bladder: Secondary | ICD-10-CM | POA: Insufficient documentation

## 2015-09-30 DIAGNOSIS — E559 Vitamin D deficiency, unspecified: Secondary | ICD-10-CM | POA: Insufficient documentation

## 2015-09-30 DIAGNOSIS — M8580 Other specified disorders of bone density and structure, unspecified site: Secondary | ICD-10-CM | POA: Insufficient documentation

## 2015-10-11 DIAGNOSIS — M79609 Pain in unspecified limb: Secondary | ICD-10-CM | POA: Diagnosis not present

## 2015-10-17 DIAGNOSIS — G629 Polyneuropathy, unspecified: Secondary | ICD-10-CM | POA: Diagnosis not present

## 2015-10-17 DIAGNOSIS — G5600 Carpal tunnel syndrome, unspecified upper limb: Secondary | ICD-10-CM | POA: Diagnosis not present

## 2015-10-24 DIAGNOSIS — M25522 Pain in left elbow: Secondary | ICD-10-CM | POA: Diagnosis not present

## 2015-10-24 DIAGNOSIS — M12522 Traumatic arthropathy, left elbow: Secondary | ICD-10-CM | POA: Diagnosis not present

## 2015-10-24 DIAGNOSIS — G5622 Lesion of ulnar nerve, left upper limb: Secondary | ICD-10-CM | POA: Diagnosis not present

## 2015-11-02 DIAGNOSIS — L570 Actinic keratosis: Secondary | ICD-10-CM | POA: Diagnosis not present

## 2015-11-02 DIAGNOSIS — Z85828 Personal history of other malignant neoplasm of skin: Secondary | ICD-10-CM | POA: Diagnosis not present

## 2015-11-02 DIAGNOSIS — Z803 Family history of malignant neoplasm of breast: Secondary | ICD-10-CM | POA: Diagnosis not present

## 2015-11-02 DIAGNOSIS — D485 Neoplasm of uncertain behavior of skin: Secondary | ICD-10-CM | POA: Diagnosis not present

## 2015-11-02 DIAGNOSIS — Z1231 Encounter for screening mammogram for malignant neoplasm of breast: Secondary | ICD-10-CM | POA: Diagnosis not present

## 2015-11-08 DIAGNOSIS — Z1231 Encounter for screening mammogram for malignant neoplasm of breast: Secondary | ICD-10-CM | POA: Diagnosis not present

## 2015-11-08 DIAGNOSIS — Z803 Family history of malignant neoplasm of breast: Secondary | ICD-10-CM | POA: Diagnosis not present

## 2015-11-08 DIAGNOSIS — R921 Mammographic calcification found on diagnostic imaging of breast: Secondary | ICD-10-CM | POA: Diagnosis not present

## 2015-11-14 ENCOUNTER — Other Ambulatory Visit: Payer: Self-pay | Admitting: Radiology

## 2015-11-14 DIAGNOSIS — Z0000 Encounter for general adult medical examination without abnormal findings: Secondary | ICD-10-CM | POA: Diagnosis not present

## 2015-11-14 DIAGNOSIS — R921 Mammographic calcification found on diagnostic imaging of breast: Secondary | ICD-10-CM | POA: Diagnosis not present

## 2015-11-14 DIAGNOSIS — C50911 Malignant neoplasm of unspecified site of right female breast: Secondary | ICD-10-CM | POA: Diagnosis not present

## 2015-11-14 DIAGNOSIS — D0511 Intraductal carcinoma in situ of right breast: Secondary | ICD-10-CM | POA: Diagnosis not present

## 2015-11-14 DIAGNOSIS — Z1231 Encounter for screening mammogram for malignant neoplasm of breast: Secondary | ICD-10-CM | POA: Diagnosis not present

## 2015-11-14 DIAGNOSIS — Z803 Family history of malignant neoplasm of breast: Secondary | ICD-10-CM | POA: Diagnosis not present

## 2015-11-16 ENCOUNTER — Telehealth: Payer: Self-pay | Admitting: *Deleted

## 2015-11-16 NOTE — Telephone Encounter (Signed)
Left message for a return phone call to schedule for [facility]. 

## 2015-11-17 ENCOUNTER — Telehealth: Payer: Self-pay | Admitting: *Deleted

## 2015-11-17 ENCOUNTER — Other Ambulatory Visit: Payer: Self-pay | Admitting: *Deleted

## 2015-11-17 DIAGNOSIS — C50911 Malignant neoplasm of unspecified site of right female breast: Secondary | ICD-10-CM

## 2015-11-17 NOTE — Telephone Encounter (Signed)
Called patient to confirm appointment for [facility] 11/23/15.  Patient instructed to arrive at 0800 and given information about location and what to expect at visit.  Patient's questions answered.  Patient given contact information and encouraged to call for any questions or concerns.

## 2015-11-17 NOTE — Progress Notes (Signed)
Erroneous encournter

## 2015-11-21 ENCOUNTER — Encounter: Payer: Self-pay | Admitting: *Deleted

## 2015-11-22 ENCOUNTER — Encounter: Payer: Self-pay | Admitting: *Deleted

## 2015-11-22 ENCOUNTER — Other Ambulatory Visit: Payer: Self-pay | Admitting: *Deleted

## 2015-11-22 DIAGNOSIS — C50111 Malignant neoplasm of central portion of right female breast: Secondary | ICD-10-CM

## 2015-11-22 HISTORY — DX: Malignant neoplasm of central portion of right female breast: C50.111

## 2015-11-23 ENCOUNTER — Ambulatory Visit: Payer: Medicare HMO | Attending: General Surgery | Admitting: Physical Therapy

## 2015-11-23 ENCOUNTER — Encounter: Payer: Self-pay | Admitting: Hematology and Oncology

## 2015-11-23 ENCOUNTER — Ambulatory Visit (HOSPITAL_BASED_OUTPATIENT_CLINIC_OR_DEPARTMENT_OTHER): Payer: Medicare HMO | Admitting: Hematology and Oncology

## 2015-11-23 ENCOUNTER — Encounter: Payer: Self-pay | Admitting: *Deleted

## 2015-11-23 ENCOUNTER — Other Ambulatory Visit: Payer: Self-pay | Admitting: General Surgery

## 2015-11-23 ENCOUNTER — Encounter: Payer: Self-pay | Admitting: Skilled Nursing Facility1

## 2015-11-23 ENCOUNTER — Encounter: Payer: Self-pay | Admitting: Physical Therapy

## 2015-11-23 ENCOUNTER — Other Ambulatory Visit: Payer: Self-pay | Admitting: *Deleted

## 2015-11-23 ENCOUNTER — Encounter: Payer: Self-pay | Admitting: Nurse Practitioner

## 2015-11-23 ENCOUNTER — Other Ambulatory Visit (HOSPITAL_BASED_OUTPATIENT_CLINIC_OR_DEPARTMENT_OTHER): Payer: Medicare HMO

## 2015-11-23 ENCOUNTER — Ambulatory Visit: Payer: Medicare HMO | Admitting: Radiation Oncology

## 2015-11-23 ENCOUNTER — Ambulatory Visit
Admission: RE | Admit: 2015-11-23 | Discharge: 2015-11-23 | Disposition: A | Discharge status: Home / Self Care | Payer: Medicare HMO | Source: Ambulatory Visit | Attending: Radiation Oncology | Admitting: Radiation Oncology

## 2015-11-23 VITALS — BP 146/74 | HR 81 | Temp 97.5°F | Resp 18 | Ht 62.75 in | Wt 162.4 lb

## 2015-11-23 DIAGNOSIS — Z87891 Personal history of nicotine dependence: Secondary | ICD-10-CM

## 2015-11-23 DIAGNOSIS — C50511 Malignant neoplasm of lower-outer quadrant of right female breast: Secondary | ICD-10-CM

## 2015-11-23 DIAGNOSIS — R293 Abnormal posture: Secondary | ICD-10-CM | POA: Insufficient documentation

## 2015-11-23 DIAGNOSIS — C50111 Malignant neoplasm of central portion of right female breast: Secondary | ICD-10-CM

## 2015-11-23 DIAGNOSIS — Z803 Family history of malignant neoplasm of breast: Secondary | ICD-10-CM

## 2015-11-23 DIAGNOSIS — M25612 Stiffness of left shoulder, not elsewhere classified: Secondary | ICD-10-CM | POA: Diagnosis not present

## 2015-11-23 DIAGNOSIS — M25522 Pain in left elbow: Secondary | ICD-10-CM | POA: Diagnosis not present

## 2015-11-23 DIAGNOSIS — Z801 Family history of malignant neoplasm of trachea, bronchus and lung: Secondary | ICD-10-CM | POA: Diagnosis not present

## 2015-11-23 DIAGNOSIS — C50411 Malignant neoplasm of upper-outer quadrant of right female breast: Secondary | ICD-10-CM | POA: Diagnosis not present

## 2015-11-23 LAB — CBC WITH DIFFERENTIAL/PLATELET
BASO%: 0.6 % (ref 0.0–2.0)
BASOS ABS: 0.1 10*3/uL (ref 0.0–0.1)
EOS ABS: 0.3 10*3/uL (ref 0.0–0.5)
EOS%: 3 % (ref 0.0–7.0)
HEMATOCRIT: 40.6 % (ref 34.8–46.6)
HEMOGLOBIN: 13.5 g/dL (ref 11.6–15.9)
LYMPH#: 1.9 10*3/uL (ref 0.9–3.3)
LYMPH%: 21.9 % (ref 14.0–49.7)
MCH: 31.7 pg (ref 25.1–34.0)
MCHC: 33.3 g/dL (ref 31.5–36.0)
MCV: 95.3 fL (ref 79.5–101.0)
MONO#: 0.7 10*3/uL (ref 0.1–0.9)
MONO%: 8.2 % (ref 0.0–14.0)
NEUT#: 5.8 10*3/uL (ref 1.5–6.5)
NEUT%: 66.3 % (ref 38.4–76.8)
Platelets: 234 10*3/uL (ref 145–400)
RBC: 4.26 10*6/uL (ref 3.70–5.45)
RDW: 13 % (ref 11.2–14.5)
WBC: 8.7 10*3/uL (ref 3.9–10.3)

## 2015-11-23 LAB — COMPREHENSIVE METABOLIC PANEL
ALBUMIN: 3.9 g/dL (ref 3.5–5.0)
ALT: 25 U/L (ref 0–55)
AST: 21 U/L (ref 5–34)
Alkaline Phosphatase: 84 U/L (ref 40–150)
Anion Gap: 8 mEq/L (ref 3–11)
BILIRUBIN TOTAL: 0.48 mg/dL (ref 0.20–1.20)
BUN: 21.8 mg/dL (ref 7.0–26.0)
CALCIUM: 9.3 mg/dL (ref 8.4–10.4)
CO2: 25 mEq/L (ref 22–29)
CREATININE: 0.9 mg/dL (ref 0.6–1.1)
Chloride: 106 mEq/L (ref 98–109)
EGFR: 62 mL/min/{1.73_m2} — AB (ref >90)
GLUCOSE: 115 mg/dL (ref 70–140)
Potassium: 4.2 mEq/L (ref 3.5–5.1)
SODIUM: 139 meq/L (ref 136–145)
TOTAL PROTEIN: 6.7 g/dL (ref 6.4–8.3)

## 2015-11-23 NOTE — Progress Notes (Signed)
[name] CONSULT NOTE  Patient Care Team: Seward Carol, MD as PCP - General (Internal Medicine)  CHIEF COMPLAINTS/PURPOSE OF CONSULTATION:  Newly diagnosed breast cancer  HISTORY OF PRESENTING ILLNESS:  [name] 81 y.o. female is here because of recent diagnosis of right breast cancer. She had a screening mammogram that revealed calcifications in the right breast that spanned 3.2 cm. She has a sister who has a diagnosis of breast cancer is a 10 year survivor. Ultrasound and biopsy were performed on the calcifications in the came back as DCIS with a small focus of invasive ductal carcinoma. The small focus of invasive ductal carcinoma was ER PR positive HER-2 negative with a Ki-67 20%.  I reviewed her records extensively and collaborated the history with the patient.  SUMMARY OF ONCOLOGIC HISTORY:   Cancer of central portion of female breast, right   11/08/2015 Mammogram Right breast 3.2 cm group of pleomorphic calcifications, breast density B, T2 N0 stage II a clinical stage (unclear how much of the calcification is invasive ductal carcinoma versus DCIS)   11/14/2015 Initial Diagnosis Right breast core biopsy: Small focus of Invasive ductal carcinoma with high-grade DCIS with comedonecrosis, ER 100%, PR 5%, Ki-67 20%, HER-2 negative    In terms of breast cancer risk profile:  She menarched at early age of 24 and went to menopause at age 33  She had 2 pregnancy, her first child was born at age 27  She has received birth control pills for approximately 20 years.  She was never exposed to fertility medications or hormone replacement therapy.  She has no family history of Breast/GYN/GI cancer  MEDICAL HISTORY:  Past Medical History  Diagnosis Date  . External hemorrhoids without mention of complication   . Diverticulosis of colon (without mention of hemorrhage)   . Phlebitis   . Arthritis   . Cataract     left eye  . Hyperlipidemia   . Thyroid disease    hypothyroidism  . Anxiety   . Insomnia   . Vitamin D insufficiency     Dr. Joan Flores  . Osteopenia     mild  . Constipation     predominant IBS-miralax and citrucel daily-has used linzess in the past. Dr Macky Lower)  . CAD (coronary artery disease) 2/10    S/P PCI (Stent) of LAD. Normal LVF-No angina  . MSSA (methicillin susceptible Staphylococcus aureus) 2011    buttock abscess  . Colonic stricture ([place]) 2013    and colon adenoma  . Squamous cell carcinoma, leg 2012    right, left  . IBS (irritable bowel syndrome)   . Bladder cancer ([place]) 2012  . Cancer of central portion of female breast, right 11/22/2015  . Breast cancer [facility])     SURGICAL HISTORY: Past Surgical History  Procedure Laterality Date  . Hemorrhoid surgery    . Total knee arthroplasty  06/2000    Left  . Left elbow surgery x4    . Tonsillectomy    . Bladder cancer surgery  2012    Dr Zannie Cove  . Knee surgery      Age 15-30  . Coronary angioplasty with stent placement  10/2008    SOCIAL HISTORY: Social History   Social History  . Marital Status: Married    Spouse Name: N/A  . Number of Children: 2  . Years of Education: N/A   Occupational History  . Retired    Social History Main Topics  . Smoking status: Former Smoker  Types: Cigarettes    Quit date: 09/10/1984  . Smokeless tobacco: Never Used     Comment: Quit tobacco 30 years ago.  . Alcohol Use: 1.2 - 1.8 oz/week    2-3 Glasses of wine per week     Comment: wine  . Drug Use: No  . Sexual Activity: No     Comment: husband vasectomy   Other Topics Concern  . Not on file   Social History Narrative   Lives at home with husband.      FAMILY HISTORY: Family History  Problem Relation Age of Onset  . Heart disease Father 45    Died of MI  . Heart attack Father 75  . Breast cancer      aunt  . Hiatal hernia Sister   . Breast cancer Sister 58  . Ulcers Mother     peptic  . Alzheimer's disease Mother   . Hypertension Brother   .  Lung cancer Brother   . Colon cancer Neg Hx   . Esophageal cancer Neg Hx   . Rectal cancer Neg Hx   . Stomach cancer Neg Hx   . Coronary artery disease Son 48    MI  . Stroke Son   . Coronary artery disease Paternal Uncle 50    Died with CAD  . Diabetes Maternal Grandmother   . Heart attack Maternal Grandfather     70's    ALLERGIES:  is allergic to benadryl; crestor; lipitor; morphine and related; tramadol; cortisone; and sulfonamide derivatives.  MEDICATIONS:  Current Outpatient Prescriptions  Medication Sig Dispense Refill  . ALPRAZolam (XANAX) 0.5 MG tablet Take 0.5 mg by mouth 2 (two) times daily as needed.    . aspirin EC 81 MG tablet Take 81 mg by mouth daily.    . Coenzyme Q10 (COQ-10) 200 MG CAPS Take 1 capsule by mouth daily.    . levothyroxine (SYNTHROID, LEVOTHROID) 50 MCG tablet Take 50 mcg by mouth daily.    . Magnesium Hydroxide (MAGNESIA PO) Take 1 tablet by mouth daily.    . Multiple Vitamins-Minerals (CENTRUM CARDIO PO) Take 1 tablet by mouth daily.    . pravastatin (PRAVACHOL) 80 MG tablet TAKE 1 TABLET (80 MG TOTAL) BY MOUTH DAILY. 30 tablet 10  . psyllium (HYDROCIL/METAMUCIL) 95 % PACK Take 1 packet by mouth 2 (two) times daily. (Patient taking differently: Take 1 packet by mouth daily. )    . ZETIA 10 MG tablet TAKE 1 TABLET BY MOUTH ONCE A DAY 30 tablet 2  . zolpidem (AMBIEN) 10 MG tablet Take 5 mg by mouth At bedtime as needed for sleep. 1/2 tablet For sleep     No current facility-administered medications for this visit.    REVIEW OF SYSTEMS:   Constitutional: Denies fevers, chills or abnormal night sweats Eyes: Denies blurriness of vision, double vision or watery eyes Ears, nose, mouth, throat, and face: Denies mucositis or sore throat Respiratory: Denies cough, dyspnea or wheezes Cardiovascular: Denies palpitation, chest discomfort or lower extremity swelling Gastrointestinal:  Denies nausea, heartburn or change in bowel habits Skin: Denies abnormal  skin rashes Lymphatics: Denies new lymphadenopathy or easy bruising Neurological:Denies numbness, tingling or new weaknesses Behavioral/Psych: Mood is stable, no new changes  Breast:  Denies any palpable lumps or discharge All other systems were reviewed with the patient and are negative.  PHYSICAL EXAMINATION: ECOG PERFORMANCE STATUS: 0 - Asymptomatic  Filed Vitals:   11/23/15 0843  BP: 146/74  Pulse: 81  Temp: 97.5 F (  36.4 C)  Resp: 18   Filed Weights   11/23/15 0843  Weight: 162 lb 6.4 oz (73.664 kg)    GENERAL:alert, no distress and comfortable SKIN: skin color, texture, turgor are normal, no rashes or significant lesions EYES: normal, conjunctiva are pink and non-injected, sclera clear OROPHARYNX:no exudate, no erythema and lips, buccal mucosa, and tongue normal  NECK: supple, thyroid normal size, non-tender, without nodularity LYMPH:  no palpable lymphadenopathy in the cervical, axillary or inguinal LUNGS: clear to auscultation and percussion with normal breathing effort HEART: regular rate & rhythm and no murmurs and no lower extremity edema ABDOMEN:abdomen soft, non-tender and normal bowel sounds Musculoskeletal:no cyanosis of digits and no clubbing  PSYCH: alert & oriented x 3 with fluent speech NEURO: no focal motor/sensory deficits BREAST: No palpable nodules in breast. No palpable axillary or supraclavicular lymphadenopathy (exam performed in the presence of a chaperone)   LABORATORY DATA:  I have reviewed the data as listed Lab Results  Component Value Date   WBC 8.7 11/23/2015   HGB 13.5 11/23/2015   HCT 40.6 11/23/2015   MCV 95.3 11/23/2015   PLT 234 11/23/2015   Lab Results  Component Value Date   NA 139 11/23/2015   K 4.2 11/23/2015   CL 109 04/03/2015   CO2 25 11/23/2015   ASSESSMENT AND PLAN:  Cancer of central portion of female breast, right Right breast core biopsy 11/14/2015: Small focus of Invasive ductal carcinoma with high-grade DCIS  with comedonecrosis, ER 100%, PR 5%, Ki-67 20%, HER-2 negative Mammogram 10/31/2015: Right breast 3.2 cm group of pleomorphic calcifications, breast density B, T2 N0 stage II a clinical stage (unclear how much of the calcification is invasive ductal carcinoma versus DCIS)  Pathology and radiology counseling:Discussed with the patient, the details of pathology including the type of breast cancer,the clinical staging, the significance of ER, PR and HER-2/neu receptors and the implications for treatment. After reviewing the pathology in detail, we proceeded to discuss the different treatment options between surgery, radiation, and antiestrogen therapies.  Recommendation based on multidisciplinary tumor board: 1. Genetic consultation because of family history.  2. Breast conserving surgery 3. Adjuvant radiation therapy 4. All of the adjuvant antiestrogen therapy with anastrozole 1 mg daily 5 years Patient has an excellent performance status and hence I recommended both radiation and antiestrogen treatments.  Return to clinic after surgery to discuss the final pathology report and finalize adjuvant treatment plan.  All questions were answered. The patient knows to call the clinic with any problems, questions or concerns.    Rulon Eisenmenger, MD 11/23/2015

## 2015-11-23 NOTE — Progress Notes (Signed)
[name] is a very pleasant 81 y.o. female from [place], [place] with newly diagnosed grade 2 invasive ductal carcinoma of the right breast.  Biopsy results revealed the tumor's prognostic profile is ER positive and PR positive.  She presents today with her husband to the [facility]) for treatment consideration and recommendations from the breast surgeon, radiation oncologist, and medical oncologist.     I briefly met with [name] and her husband during her [facility] visit today. We discussed the purpose of the Survivorship Clinic, which will include monitoring for recurrence, coordinating completion of age and gender-appropriate cancer screenings, promotion of overall wellness, as well as managing potential late/long-term side effects of anti-cancer treatments.    The treatment plan for [name] will likely include surgery, radiation therapy, and anti-estrogen therapy.  She will meet with the Genetics Counselor due to her family history of breast cancer. As of today, the intent of treatment for [name] is cure, therefore she will be eligible for the Survivorship Clinic upon her completion of treatment.  Her survivorship care plan (SCP) document will be drafted and updated throughout the course of her treatment trajectory. She will receive the SCP in an office visit with myself in the Survivorship Clinic once she has completed treatment.   [name] was encouraged to ask questions and all questions were answered to her satisfaction.  She was given my business card and encouraged to contact me with any concerns regarding survivorship.  I look forward to participating in her care.   Kenn File, [place] [phone]

## 2015-11-23 NOTE — Assessment & Plan Note (Signed)
Right breast core biopsy 11/14/2015: Small focus of Invasive ductal carcinoma with high-grade DCIS with comedonecrosis, ER 100%, PR 5%, Ki-67 20%, HER-2 negative Mammogram 10/31/2015: Right breast 3.2 cm group of pleomorphic calcifications, breast density B, T2 N0 stage II a clinical stage (unclear how much of the calcification is invasive ductal carcinoma versus DCIS)  Pathology and radiology counseling:Discussed with the patient, the details of pathology including the type of breast cancer,the clinical staging, the significance of ER, PR and HER-2/neu receptors and the implications for treatment. After reviewing the pathology in detail, we proceeded to discuss the different treatment options between surgery, radiation, and antiestrogen therapies.  Recommendation based on multidisciplinary tumor board: 1. Genetic consultation because of family history.  2. Breast conserving surgery 3. Adjuvant radiation therapy 4. All of the adjuvant antiestrogen therapy with anastrozole 1 mg daily 5 years Patient is an excellent performance status and hence recommended both radiation and antiestrogen treatment.  Return to clinic after surgery to discuss the final pathology report and finalize adjuvant treatment plan.

## 2015-11-23 NOTE — Progress Notes (Signed)
  Radiation Oncology         [id number]) [id number] ________________________________  Initial outpatient Consultation - Date: 11/23/2015   Name: [name] MRN: [id number]   DOB: Jan 20, 1935  REFERRING PHYSICIAN: Autumn Messing III, MD  DIAGNOSIS AND STAGE: T1a N0 Invasive Ductal Carcinoma of the Right Breast.   HISTORY OF PRESENT ILLNESS::[name] is a 81 y.o. female who was found to have calcifications on a screening mammogram. These measured about 3 cm. Biopsy revealed DCIS and a small focus of invasive ductal carcinoma. This was ER/PR positive with a Ki67 of 20%. Through discussion in Multidisciplinary Conference, genetic testing was recommended.  She is accompanied by her husband. She is concerned about radiation given her "thin skin"  She is independent in her ADLs. She has done well since her biopsy.   PREVIOUS RADIATION THERAPY: No  OBGYN History:  Age of first menstrual period: 14 yrs.  Approximate date of last period: Early 24s  Hormone replacement: Yes;  Duration of Hormone replacement: ~ 20 yrs Children carried to term: 2 Age at first live birth: 33  Currently pregnant?: No  Ever used birth control pills or hormone shots for contraception?: No   Past medical, social and family history were reviewed in the electronic chart. Review of symptoms was reviewed in the electronic chart. Medications were reviewed in the electronic chart.   PHYSICAL EXAM:  BP: 146/74 mmHg, Pulse Rate: 81, Resp: 18, Temp: 97.5 F, Temp Source: Oral, SpO2: 100%, Weight: 162 lb 6.4 oz, Height: 5' 2.75"   She is an elderly female in no distress. She had some bruising and a palpable hematoma in the outer quadrant of the right breast. No palpable abnormalities of the left breast. No palpable cervical, supraclavicular, or axillary adenopathy.   IMPRESSION: Britainy Garcia is a 81 y.o. female with T1a N0 Invasive Ductal Carcinoma of the Right Breast.  PLAN: I spoke to the patient today regarding her diagnosis and  options for treatment. We discussed the equivalence in terms of survival and local failure between mastectomy and breast conservation. We discussed the role of radiation in decreasing local failures in patients who undergo lumpectomy. We discussed the process of simulation and the placement tattoos. We discussed 4 weeks of treatment as an outpatient. We discussed the possibility of asymptomatic lung damage. We discussed the low likelihood of secondary malignancies. We discussed the possible side effects including but not limited to skin redness, fatigue, permanent skin darkening, and breast swelling. We discussed the process of simulation and the placement of tattoos.    She has an excellent performance status and would benefit from both radiation and antiestrogen.   She met with medical oncology as well as a member of our patient family support team and our physical therapist. I will plan on seeing her back after her surgery. She will need a pre-radiation mammogram.   I spent 40 minutes face to face with the patient and more than 50% of that time was spent in counseling and/or coordination of care.   ------------------------------------------------  Thea Silversmith, MD  This document serves as a record of services personally performed by Thea Silversmith, MD. It was created on her behalf by Jenell Milliner, a trained medical scribe. The creation of this record is based on the scribe's personal observations and the provider's statements to them. This document has been checked and approved by the attending provider.

## 2015-11-23 NOTE — Progress Notes (Signed)
Subjective:     Patient ID: [name], female   DOB: Jan 19, 1935, 81 y.o.   MRN: [id number]  HPI   Review of Systems     Objective:   Physical Exam For the patient to understand and be given the tools to implement a healthy plant based diet during their cancer diagnosis.     Assessment:     Patient was seen today and found to be pleasant.  Pts ht 5'2'', 162 pounds, BMI 29.1. Pts GFR 62. Pts medications CoQ10, pravastatin, metamucil, levothyroxine, multivitamin, magnesium.  Pts dx hyperlipidemia.      Plan:     Dietitian educated the patient on implementing a plant based diet by incorporating more plant proteins, fruits, and vegetables. As a part of a healthy routine physical activity was discussed. Dietitian educated the pt on ensuring she eats throughout the day to get balanced nutrients/calories.  The importance of legitimate, evidence based information was discussed and examples were given. A folder of evidence based information with a focus on a plant based diet and general nutrition during cancer was given to the patient.  As a part of the continuum of care the cancer dietitian's contact information was given to the patient in the event they would like to have a follow up appointment.

## 2015-11-23 NOTE — Progress Notes (Signed)
[facility] Psychosocial Distress Screening Clinical Social Work  Clinical Social Work met with pt and her husband at [facility] to introduce self, review role of CSW/ Pt and Family Support Team and was review distress screening protocol.The patient scored a 6 on the Psychosocial Distress Thermometer which indicates moderate distress. Pt reports she recently had her brother in-law expire the same day she found out she had breast cancer. This was a shock, but she is moving forward. Pt reports she feels much less stressed after her clinic visit today and now feels a 3 on the distress scale. Pt reports to have good supports to assist her and relies heavily on her faith. She was not interested in an [employer] today, but may explore the various Support Programs available through [facility]. Pt reports her pain is due to an injured elbow and this has been addressed with her PCP, per her report. Pt denied other concerns and agrees to reach out as needed.   ONCBCN DISTRESS SCREENING 11/23/2015  Screening Type Initial Screening  Distress experienced in past week (1-10) 6  Family Problem type Other (comment)  Emotional problem type Adjusting to illness  Physical Problem type Pain  Physician notified of physical symptoms No  Referral to clinical social work Yes  Referral to support programs Yes    Clinical Social Worker follow up needed: No.  If yes, follow up plan:  Loren Racer, [place]  [facility] Phone: [phone] Fax: [phone]

## 2015-11-23 NOTE — Therapy (Signed)
[place], [place], [address] Phone: [phone]   Fax:  [phone]  Physical Therapy Evaluation  Patient Details  Name: [name] MRN: [id number] Date of Birth: [birth year] Referring Provider: Dr. Autumn Messing  Encounter Date: 11/23/2015      PT End of Session - 11/23/15 1628    Visit Number 1   Number of Visits 1   PT Start Time 1010   PT Stop Time 1042   PT Time Calculation (min) 32 min   Activity Tolerance Patient tolerated treatment well   Behavior During Therapy [facility] for tasks assessed/performed      Past Medical History  Diagnosis Date  . External hemorrhoids without mention of complication   . Diverticulosis of colon (without mention of hemorrhage)   . Phlebitis   . Arthritis   . Cataract     left eye  . Hyperlipidemia   . Thyroid disease     hypothyroidism  . Anxiety   . Insomnia   . Vitamin D insufficiency     Dr. Joan Flores  . Osteopenia     mild  . Constipation     predominant IBS-miralax and citrucel daily-has used linzess in the past. Dr Macky Lower)  . CAD (coronary artery disease) 2/10    S/P PCI (Stent) of LAD. Normal LVF-No angina  . MSSA (methicillin susceptible Staphylococcus aureus) 2011    buttock abscess  . Colonic stricture ([place]) 2013    and colon adenoma  . Squamous cell carcinoma, leg 2012    right, left  . IBS (irritable bowel syndrome)   . Bladder cancer ([place]) 2012  . Cancer of central portion of female breast, right 11/22/2015  . Breast cancer [facility])     Past Surgical History  Procedure Laterality Date  . Hemorrhoid surgery    . Total knee arthroplasty  06/2000    Left  . Left elbow surgery x4    . Tonsillectomy    . Bladder cancer surgery  2012    Dr Zannie Cove  . Knee surgery      Age [age]  . Coronary angioplasty with stent placement  10/2008    There were no vitals filed for this visit.  Visit Diagnosis:  Carcinoma of lower outer quadrant of  right breast ([place]) - Plan: PT plan of care cert/re-cert  Abnormal posture - Plan: PT plan of care cert/re-cert  Shoulder stiffness, left - Plan: PT plan of care cert/re-cert  Left elbow pain - Plan: PT plan of care cert/re-cert      Subjective Assessment - 11/23/15 1628    Subjective Patient reports she was diagnosed with right breast cancer and is here today for an assessment with her medical team.   Patient is accompained by: Family member   Pertinent History Patient was diagnosed on 11/14/15 with right DCIS breast cancer with a small area of invasive breast cancer.  It measures 3.2 cm in the lower outer quadrant, is ER/PR positive, HER2 negative and has a Ki67 of 20%.  Her case was discussed among the breast multidisciplinary team to determine a recommended treatment plan prior to her being seen.   Patient Stated Goals Reduce lymphedema risk and elarn post op shoulder ROM HEP            [facility] PT Assessment - 11/23/15 0001    Assessment   Medical Diagnosis Right breast cancer   Referring Provider Dr. Autumn Messing   Onset Date/Surgical Date 11/14/15   Hand  Dominance Left   Prior Therapy none   Precautions   Precautions Other (comment)   Precaution Comments Active breast cancer; left elbow surgeries   Restrictions   Weight Bearing Restrictions No   Balance Screen   Has the patient fallen in the past 6 months No   Has the patient had a decrease in activity level because of a fear of falling?  No   Is the patient reluctant to leave their home because of a fear of falling?  No   Home Ecologist residence   Living Arrangements Spouse/significant other   Available Help at Discharge Family   Prior Function   Level of [place] Retired   Biomedical scientist N/A   Leisure She has not exercised since 1/17 but was regularly biking and doing strength training   Cognition   Overall Cognitive Status Within Functional Limits for  tasks assessed   Posture/Postural Control   Posture/Postural Control Postural limitations   Postural Limitations Forward head;Rounded Shoulders  Left elbow flexion contracture   ROM / Strength   AROM / PROM / Strength AROM;Strength   AROM   AROM Assessment Site Shoulder   Right/Left Shoulder Right;Left   Right Shoulder Extension 51 Degrees   Right Shoulder Flexion 151 Degrees   Right Shoulder ABduction 153 Degrees   Right Shoulder Internal Rotation 72 Degrees   Right Shoulder External Rotation 72 Degrees   Left Shoulder Extension 55 Degrees   Left Shoulder Flexion 122 Degrees  Likely limited by elbow flexion contracture and pain   Left Shoulder ABduction 122 Degrees  Likely limited by elbow flexion contracture and pain   Left Shoulder Internal Rotation 66 Degrees   Left Shoulder External Rotation 78 Degrees   Strength   Overall Strength Within functional limits for tasks performed   Overall Strength Comments Right UE is WFL; left UE not tested due to multiple elbow surgeries           LYMPHEDEMA/ONCOLOGY QUESTIONNAIRE - 11/23/15 1626    Type   Cancer Type Right breast cancer   Lymphedema Assessments   Lymphedema Assessments Upper extremities   Right Upper Extremity Lymphedema   10 cm Proximal to Olecranon Process 29.4 cm   Olecranon Process 26 cm   10 cm Proximal to Ulnar Styloid Process 24 cm   Just Proximal to Ulnar Styloid Process 17.5 cm   Across Hand at [employer] 18.4 cm   At [place] of 2nd Digit 6.7 cm   Left Upper Extremity Lymphedema   10 cm Proximal to Olecranon Process 29.1 cm   Olecranon Process 27 cm   10 cm Proximal to Ulnar Styloid Process 23.8 cm   Just Proximal to Ulnar Styloid Process 17 cm   Across Hand at [employer] 17.7 cm   At [place] of 2nd Digit 6.4 cm      Patient was instructed today in a home exercise program today for post op shoulder range of motion. These included active assist shoulder flexion in sitting, scapular retraction, wall  walking with shoulder abduction, and hands behind head external rotation.  She was encouraged to do these twice a day, holding 3 seconds and repeating 5 times when permitted by her physician.          PT Education - 11/23/15 1627    Education provided Yes   Education Details Lymphedema risk reduction and post op shoulder ROM HEP   Person(s) Educated Patient   Methods  Explanation;Demonstration;Handout   Comprehension Verbalized understanding;Returned demonstration              Breast Clinic Goals - 2015/12/16 1634    Patient will be able to verbalize understanding of pertinent lymphedema risk reduction practices relevant to her diagnosis specifically related to skin care.   Time 1   Period Days   Status Achieved   Patient will be able to return demonstrate and/or verbalize understanding of the post-op home exercise program related to regaining shoulder range of motion.   Time 1   Period Days   Status Achieved   Patient will be able to verbalize understanding of the importance of attending the postoperative After Breast Cancer Class for further lymphedema risk reduction education and therapeutic exercise.   Time 1   Period Days   Status Achieved              Plan - Dec 16, 2015 1628    Clinical Impression Statement Patient was diagnosed on 11/14/15 with right DCIS breast cancer with a small area of invasive breast cancer.  It measures 3.2 cm in the lower outer quadrant, is ER/PR positive, HER2 negative and has a Ki67 of 20%.  Her case was discussed among the breast multidisciplinary team to determine a recommended treatment plan prior to her being seen.  She is planning to have a right lumpectomy and sentinel node biopsy followed possibly by radiation and/or anti-estrogen therapy.  She may benefit from post op PT to regain shoulder ROM and strength and reduce risk of lymphedema.   Pt will benefit from skilled therapeutic intervention in order to improve on the following deficits  Decreased strength;Decreased knowledge of precautions;Pain;Impaired UE functional use;Decreased range of motion   Rehab Potential Excellent   Clinical Impairments Affecting Rehab Potential none   PT Frequency One time visit   PT Treatment/Interventions Therapeutic exercise;Patient/family education   PT Next Visit Plan Will f/u after surgery   PT Home Exercise Plan Post op shoulder ROM HEP   Consulted and Agree with Plan of Care Patient;Family member/caregiver   Family Member Consulted Husband          G-Codes - 16-Dec-2015 1634    Functional Assessment Tool Used Clinical Judgement   Functional Limitation Other PT primary   Other PT Primary Current Status [phone]) At least 1 percent but less than 20 percent impaired, limited or restricted   Other PT Primary Goal Status (R9163) At least 1 percent but less than 20 percent impaired, limited or restricted   Other PT Primary Discharge Status (W4665) At least 1 percent but less than 20 percent impaired, limited or restricted     Patient will follow up at outpatient cancer rehab if needed following surgery.  If the patient requires physical therapy at that time, a specific plan will be dictated and sent to the referring physician for approval. The patient was educated today on appropriate basic range of motion exercises to begin post operatively and the importance of attending the After Breast Cancer class following surgery.  Patient was educated today on lymphedema risk reduction practices as it pertains to recommendations that will benefit the patient immediately following surgery.  She verbalized good understanding.  No additional physical therapy is indicated at this time.     Problem List Patient Active Problem List   Diagnosis Date Noted  . Cancer of central portion of female breast, right 11/22/2015  . Peripheral vertigo 04/03/2015  . Dizziness 04/03/2015  . Elevated blood pressure 10/21/2013  . Coronary atherosclerosis  of native coronary  artery 10/13/2013  . Pure hypercholesterolemia 10/13/2013  . Hyperlipidemia 08/26/2013  . Precordial pain 03/16/2012  . IBS 07/27/2008  . PERSONAL HX COLONIC POLYPS 07/27/2008    Annia Friendly, PT 11/23/2015 4:36 PM  [place], [place], [address] Phone: [phone]   Fax:  [phone]  Name: [name] MRN: [id number] Date of Birth: Jan 19, 1935

## 2015-11-23 NOTE — Patient Instructions (Signed)

## 2015-11-23 NOTE — Progress Notes (Signed)
Note created by Dr. Gudena during office visit, copy to patient,original to scan. 

## 2015-11-28 ENCOUNTER — Telehealth: Payer: Self-pay | Admitting: *Deleted

## 2015-11-28 ENCOUNTER — Telehealth: Payer: Self-pay | Admitting: Cardiology

## 2015-11-28 NOTE — Telephone Encounter (Signed)
error 

## 2015-11-28 NOTE — Telephone Encounter (Signed)
Spoke with patient to follow up from [facility] 11/23/15.  Patient denies any questions or concerns.  Encouraged her to call with any needs or concerns.

## 2015-11-30 ENCOUNTER — Other Ambulatory Visit (INDEPENDENT_AMBULATORY_CARE_PROVIDER_SITE_OTHER): Payer: Medicare HMO | Admitting: *Deleted

## 2015-11-30 ENCOUNTER — Other Ambulatory Visit: Payer: Self-pay

## 2015-11-30 DIAGNOSIS — E785 Hyperlipidemia, unspecified: Secondary | ICD-10-CM

## 2015-11-30 LAB — HEPATIC FUNCTION PANEL
ALT: 22 U/L (ref 6–29)
AST: 21 U/L (ref 10–35)
Albumin: 4.1 g/dL (ref 3.6–5.1)
Alkaline Phosphatase: 78 U/L (ref 33–130)
BILIRUBIN INDIRECT: 0.5 mg/dL (ref 0.2–1.2)
Bilirubin, Direct: 0.1 mg/dL (ref <=0.2)
TOTAL PROTEIN: 6.4 g/dL (ref 6.1–8.1)
Total Bilirubin: 0.6 mg/dL (ref 0.2–1.2)

## 2015-11-30 LAB — LIPID PANEL
CHOL/HDL RATIO: 1.9 ratio (ref <=5.0)
CHOLESTEROL: 171 mg/dL (ref 125–200)
HDL: 92 mg/dL (ref >=46)
LDL Cholesterol: 60 mg/dL (ref <130)
TRIGLYCERIDES: 96 mg/dL (ref <150)
VLDL: 19 mg/dL (ref <30)

## 2015-12-01 ENCOUNTER — Other Ambulatory Visit: Payer: Medicare Other

## 2015-12-04 NOTE — Progress Notes (Signed)
Cardiology Office Note   Date:  12/05/2015   ID:  [name], DOB 10/02/1934, MRN [id number]  PCP:  Kandice Hams, MD    Chief Complaint  Patient presents with  . Coronary Artery Disease  . Hyperlipidemia      History of Present Illness: [name] is a 81 y.o. female  with a history of ASCAD and dyslipidemia who presents today for followup. She is doing well but unfortunately was just diagnosed with breast CA.  She is doing well from a cardiac standpoint.   She denies any chest pain, SOB, DOE, dizziness, palpitations or syncope. She occasionally has some mild LE edema which is controlled. This is controlled and has not changed. She exercises with weights at home for 20 minutes and walks some outside for exercise.  She is able to walk 4 blocks without any problems.     Past Medical History  Diagnosis Date  . External hemorrhoids without mention of complication   . Diverticulosis of colon (without mention of hemorrhage)   . Phlebitis   . Arthritis   . Cataract     left eye  . Hyperlipidemia   . Thyroid disease     hypothyroidism  . Anxiety   . Insomnia   . Vitamin D insufficiency     Dr. Joan Flores  . Osteopenia     mild  . Constipation     predominant IBS-miralax and citrucel daily-has used linzess in the past. Dr Macky Lower)  . CAD (coronary artery disease) 2/10    S/P PCI (Stent) of LAD. Normal LVF-No angina  . MSSA (methicillin susceptible Staphylococcus aureus) 2011    buttock abscess  . Colonic stricture ([place]) 2013    and colon adenoma  . Squamous cell carcinoma, leg 2012    right, left  . IBS (irritable bowel syndrome)   . Bladder cancer ([place]) 2012  . Cancer of central portion of female breast, right 11/22/2015  . Breast cancer [facility])     Past Surgical History  Procedure Laterality Date  . Hemorrhoid surgery    . Total knee arthroplasty  06/2000    Left  . Left elbow surgery x4    . Tonsillectomy    . Bladder cancer surgery   2012    Dr Zannie Cove  . Knee surgery      Age 58-30  . Coronary angioplasty with stent placement  10/2008     Current Outpatient Prescriptions  Medication Sig Dispense Refill  . ALPRAZolam (XANAX) 0.5 MG tablet Take 0.5 mg by mouth 2 (two) times daily as needed for anxiety.     Marland Kitchen aspirin EC 81 MG tablet Take 81 mg by mouth daily.    . Coenzyme Q10 (COQ-10) 200 MG CAPS Take 1 capsule by mouth daily.    Marland Kitchen levothyroxine (SYNTHROID, LEVOTHROID) 50 MCG tablet Take 50 mcg by mouth daily.    . Magnesium Hydroxide (MAGNESIA PO) Take 1 tablet by mouth daily.    . Multiple Vitamins-Minerals (CENTRUM CARDIO PO) Take 1 tablet by mouth daily.    . pravastatin (PRAVACHOL) 80 MG tablet TAKE 1 TABLET (80 MG TOTAL) BY MOUTH DAILY. 30 tablet 10  . psyllium (HYDROCIL/METAMUCIL) 95 % PACK Take 1 packet by mouth daily.    Marland Kitchen ZETIA 10 MG tablet TAKE 1 TABLET BY MOUTH ONCE A DAY 30 tablet 2  . zolpidem (AMBIEN) 10 MG tablet Take 5 mg  by mouth At bedtime as needed for sleep. 1/2 tablet For sleep     No current facility-administered medications for this visit.    Allergies:   Benadryl; Crestor; Lipitor; Morphine and related; Tramadol; Cortisone; and Sulfonamide derivatives    Social History:  The patient  reports that she quit smoking about 31 years ago. Her smoking use included Cigarettes. She has never used smokeless tobacco. She reports that she drinks about 1.2 - 1.8 oz of alcohol per week. She reports that she does not use illicit drugs.   Family History:  The patient's family history includes Alzheimer's disease in her mother; Breast cancer (age of onset: 1) in her sister; Coronary artery disease (age of onset: 26) in her son; Coronary artery disease (age of onset: 15) in her paternal uncle; Diabetes in her maternal grandmother; Heart attack in her maternal grandfather; Heart attack (age of onset: 58) in her father; Heart disease (age of onset: 74) in her father; Hiatal hernia in her sister; Hypertension in  her brother; Lung cancer in her brother; Stroke in her son; Ulcers in her mother. There is no history of Colon cancer, Esophageal cancer, Rectal cancer, or Stomach cancer.    ROS:  Please see the history of present illness.   Otherwise, review of systems are positive for none.   All other systems are reviewed and negative.    PHYSICAL EXAM: VS:  BP 136/76 mmHg  Pulse 99  Ht 5\' 3"  (1.6 m)  Wt 161 lb 1.9 oz (73.084 kg)  BMI 28.55 kg/m2 , BMI Body mass index is 28.55 kg/(m^2). GEN: Well nourished, well developed, in no acute distress HEENT: normal Neck: no JVD, carotid bruits, or masses Cardiac: RRR; no murmurs, rubs, or gallops,no edema  Respiratory:  clear to auscultation bilaterally, normal work of breathing GI: soft, nontender, nondistended, + BS MS: no deformity or atrophy Skin: warm and dry, no rash Neuro:  Strength and sensation are intact Psych: euthymic mood, full affect   EKG:  EKG is not ordered today.    Recent Labs: 04/03/2015: TSH 0.751 11/23/2015: BUN 21.8; Creatinine 0.9; HGB 13.5; Platelets 234; Potassium 4.2; Sodium 139 11/30/2015: ALT 22    Lipid Panel    Component Value Date/Time   CHOL 171 11/30/2015 0900   CHOL 164 06/21/2014 0829   TRIG 96 11/30/2015 0900   TRIG 115 06/21/2014 0829   HDL 92 11/30/2015 0900   HDL 78 06/21/2014 0829   CHOLHDL 1.9 11/30/2015 0900   VLDL 19 11/30/2015 0900   LDLCALC 60 11/30/2015 0900   LDLCALC 63 06/21/2014 0829      Wt Readings from Last 3 Encounters:  12/05/15 161 lb 1.9 oz (73.084 kg)  11/23/15 162 lb 6.4 oz (73.664 kg)  03/08/15 162 lb (73.483 kg)     ASSESSMENT AND PLAN:  1. ASCAD with remote PCI with no angina - continue ASA  2. Dyslipidemia - LDL at goal at 60.  She is having a lot of achiness in her joints and would like to decrease the dose of pravastatin.   - continue Pravastatin/zetia and decrease dose of pravastatin to 40mg  daily and recheck FLP and ALT in 6 weeks.       3.   Preoperative  cardiac clearance - this is for breast lumpectomy.  She is low risk from a cardiac standpoint to proceed with breast surgery.        4.   Breast Cancer   Current medicines are reviewed at length with the  patient today.  The patient does not have concerns regarding medicines.  The following changes have been made:  Decrease pravastatin to 40mg  daily  Labs/ tests ordered today: See above Assessment and Plan No orders of the defined types were placed in this encounter.     Disposition:   FU with me in 1 year  Signed, Sueanne Margarita, MD  12/05/2015 1:27 PM    [facility] [place], [place], [place]  [address] Phone: [phone]; Fax: [phone]

## 2015-12-05 ENCOUNTER — Ambulatory Visit (INDEPENDENT_AMBULATORY_CARE_PROVIDER_SITE_OTHER): Payer: Medicare HMO | Admitting: Cardiology

## 2015-12-05 ENCOUNTER — Encounter: Payer: Self-pay | Admitting: Cardiology

## 2015-12-05 VITALS — BP 136/76 | HR 99 | Ht 63.0 in | Wt 161.1 lb

## 2015-12-05 DIAGNOSIS — Z01810 Encounter for preprocedural cardiovascular examination: Secondary | ICD-10-CM | POA: Diagnosis not present

## 2015-12-05 DIAGNOSIS — E785 Hyperlipidemia, unspecified: Secondary | ICD-10-CM

## 2015-12-05 DIAGNOSIS — C50111 Malignant neoplasm of central portion of right female breast: Secondary | ICD-10-CM

## 2015-12-05 DIAGNOSIS — I2510 Atherosclerotic heart disease of native coronary artery without angina pectoris: Secondary | ICD-10-CM

## 2015-12-05 MED ORDER — EZETIMIBE 10 MG PO TABS: 10.0000 mg | ORAL_TABLET | Freq: Every day | ORAL | Status: DC

## 2015-12-05 MED ORDER — PRAVASTATIN SODIUM 40 MG PO TABS: 40.0000 mg | ORAL_TABLET | Freq: Every day | ORAL | Status: DC

## 2015-12-05 NOTE — Patient Instructions (Signed)
Medication Instructions:  Your physician has recommended you make the following change in your medication:  1) DECREASE PRAVASTATIN to 40 mg daily  Labwork: Your physician recommends that you return for FASTING lab work in: 6 weeks  Testing/Procedures: None  Follow-Up: Your physician wants you to follow-up in: 1 year with Dr. Radford Pax. You will receive a reminder letter in the mail two months in advance. If you don't receive a letter, please call our office to schedule the follow-up appointment.   Any Other Special Instructions Will Be Listed Below (If Applicable).     If you need a refill on your cardiac medications before your next appointment, please call your pharmacy.

## 2015-12-09 ENCOUNTER — Encounter: Payer: Self-pay | Admitting: *Deleted

## 2015-12-09 ENCOUNTER — Telehealth: Payer: Self-pay | Admitting: Hematology and Oncology

## 2015-12-09 NOTE — Telephone Encounter (Signed)
Spoke with patient to confirm appt sch for May per 3/30 pof

## 2015-12-14 DIAGNOSIS — L719 Rosacea, unspecified: Secondary | ICD-10-CM | POA: Diagnosis not present

## 2015-12-14 DIAGNOSIS — Z85828 Personal history of other malignant neoplasm of skin: Secondary | ICD-10-CM | POA: Diagnosis not present

## 2015-12-20 ENCOUNTER — Other Ambulatory Visit: Payer: Medicare Other

## 2015-12-20 ENCOUNTER — Encounter: Payer: Medicare Other | Admitting: Genetic Counselor

## 2015-12-27 ENCOUNTER — Encounter (HOSPITAL_COMMUNITY): Payer: Self-pay

## 2015-12-27 ENCOUNTER — Other Ambulatory Visit: Payer: Self-pay | Admitting: General Surgery

## 2015-12-27 ENCOUNTER — Encounter (HOSPITAL_COMMUNITY)
Admission: RE | Admit: 2015-12-27 | Discharge: 2015-12-27 | Disposition: A | Discharge status: Home / Self Care | Payer: Medicare HMO | Source: Ambulatory Visit | Attending: General Surgery | Admitting: General Surgery

## 2015-12-27 DIAGNOSIS — Z01818 Encounter for other preprocedural examination: Secondary | ICD-10-CM | POA: Diagnosis not present

## 2015-12-27 DIAGNOSIS — Z79899 Other long term (current) drug therapy: Secondary | ICD-10-CM | POA: Diagnosis not present

## 2015-12-27 DIAGNOSIS — E039 Hypothyroidism, unspecified: Secondary | ICD-10-CM | POA: Diagnosis not present

## 2015-12-27 DIAGNOSIS — I2510 Atherosclerotic heart disease of native coronary artery without angina pectoris: Secondary | ICD-10-CM | POA: Insufficient documentation

## 2015-12-27 DIAGNOSIS — C50911 Malignant neoplasm of unspecified site of right female breast: Secondary | ICD-10-CM

## 2015-12-27 DIAGNOSIS — Z87891 Personal history of nicotine dependence: Secondary | ICD-10-CM | POA: Insufficient documentation

## 2015-12-27 DIAGNOSIS — Z01812 Encounter for preprocedural laboratory examination: Secondary | ICD-10-CM | POA: Insufficient documentation

## 2015-12-27 DIAGNOSIS — C50919 Malignant neoplasm of unspecified site of unspecified female breast: Secondary | ICD-10-CM | POA: Insufficient documentation

## 2015-12-27 DIAGNOSIS — Z955 Presence of coronary angioplasty implant and graft: Secondary | ICD-10-CM | POA: Insufficient documentation

## 2015-12-27 DIAGNOSIS — E785 Hyperlipidemia, unspecified: Secondary | ICD-10-CM | POA: Diagnosis not present

## 2015-12-27 DIAGNOSIS — Z7982 Long term (current) use of aspirin: Secondary | ICD-10-CM | POA: Insufficient documentation

## 2015-12-27 DIAGNOSIS — Z8551 Personal history of malignant neoplasm of bladder: Secondary | ICD-10-CM | POA: Insufficient documentation

## 2015-12-27 HISTORY — DX: Other specified postprocedural states: Z98.890

## 2015-12-27 HISTORY — DX: Other specified postprocedural states: R11.2

## 2015-12-27 LAB — BASIC METABOLIC PANEL
Anion gap: 9 (ref 5–15)
BUN: 15 mg/dL (ref 6–20)
CHLORIDE: 109 mmol/L (ref 101–111)
CO2: 24 mmol/L (ref 22–32)
CREATININE: 1 mg/dL (ref 0.44–1.00)
Calcium: 9.1 mg/dL (ref 8.9–10.3)
GFR calc Af Amer: >60 mL/min (ref >60)
GFR calc non Af Amer: 53 mL/min — ABNORMAL LOW (ref >60)
GLUCOSE: 105 mg/dL — AB (ref 65–99)
POTASSIUM: 4.3 mmol/L (ref 3.5–5.1)
Sodium: 142 mmol/L (ref 135–145)

## 2015-12-27 LAB — CBC
HEMATOCRIT: 41.5 % (ref 36.0–46.0)
Hemoglobin: 13.2 g/dL (ref 12.0–15.0)
MCH: 30.6 pg (ref 26.0–34.0)
MCHC: 31.8 g/dL (ref 30.0–36.0)
MCV: 96.1 fL (ref 78.0–100.0)
Platelets: 201 10*3/uL (ref 150–400)
RBC: 4.32 MIL/uL (ref 3.87–5.11)
RDW: 13.1 % (ref 11.5–15.5)
WBC: 9.1 10*3/uL (ref 4.0–10.5)

## 2015-12-27 MED ORDER — CHLORHEXIDINE GLUCONATE 4 % EX LIQD: 1.0000 "application " | Freq: Once | CUTANEOUS | Status: DC

## 2015-12-27 NOTE — Progress Notes (Signed)
Anesthesia Chart Review:  Pt is a 81 year old female scheduled for radioactive seed guided R partial mastectomy with sentinel lymph node biopsy on 01/04/2016 with Dr. Marlou Starks.   Cardiologist is Dr. Fransico Him, last office visit 12/04/15.   PMH includes:  CAD (s/p LAD stenting 2010), hyperlipidemia, hypothyroidism, post-op N/V, bladder cancer, breast cancer. Former smoker. BMI 29  Medications include: ASA, zetia, levothyroxine, pravastatin.  Preoperative labs reviewed.    EKG 04/03/15: Sinus rhythm. Borderline prolonged PR interval. Borderline right axis deviation. Low voltage, precordial leads. Minimal ST depression, lateral leads.   Nuclear stress test 03/20/12: Normal myocardial perfusion study. EF 87%  Echo 09/13/09:  1. LV EF 55-60%. 2. Trace aortic regurgitation. AV is sclerotic but opens well.  3. Trivial tricuspid regurgitation 4. Trace pulmonic regurgitation  Cardiac cath 10/13/08:  1. Severe 1-vessel obstructive coronary disease of the left anterior descending. Treated with DES to LAD.  2. Normal left ventricular function. 3. Chest pain and shortness of breath with positive nuclear stress test. 4. Anteroapical ischemia.  If no changes, I anticipate pt can proceed with surgery as scheduled.   Willeen Cass, FNP-BC [facility] Short Stay Surgical Center/Anesthesiology Phone: [phone] 12/27/2015 3:32 PM

## 2015-12-27 NOTE — Pre-Procedure Instructions (Signed)
    [place]  12/27/2015      [id number] - [place],  - [address]. AT [address] [name] [address] Phone: [phone] Fax: [place] # [address] [place] [address] Phone: [phone] Fax: [phone]    Your procedure is scheduled on January 04, 2016.  Report to [facility] Admitting at 6:30 A.M.  Call this number if you have problems the morning of surgery:  [phone]   Remember:  Do not eat food or drink liquids after midnight.  Take these medicines the morning of surgery with A SIP OF WATER : ezetimibe (ZETIA) , levothyroxine (SYNTHROID, LEVOTHROID), pravastatin (PRAVACHOL);   IF NEEDED:ALPRAZolam (XANAX),   acetaminophen (TYLENOL)     STOP ASPIRIN, HERBAL MEDICATIONS, COQ-10 ONE WEEK PRIOR TO SURGERY   Do not wear jewelry, make-up or nail polish.  Do not wear lotions, powders, or perfumes.  You may NOT wear deodorant.  Do not shave 48 hours prior to surgery.    Do not bring valuables to the hospital.  [facility] is not responsible for any belongings or valuables.  Contacts, dentures or bridgework may not be worn into surgery.  Leave your suitcase in the car.  After surgery it may be brought to your room.  For patients admitted to the hospital, discharge time will be determined by your treatment team.  Patients discharged the day of surgery will not be allowed to drive home.   Name and phone number of your driver:    Special instructions:  "PREPARING FOR SURGERY"  Please read over the following fact sheets that you were given. Pain Booklet, Coughing and Deep Breathing and Surgical Site Infection Prevention

## 2016-01-02 DIAGNOSIS — C50511 Malignant neoplasm of lower-outer quadrant of right female breast: Secondary | ICD-10-CM | POA: Diagnosis not present

## 2016-01-02 DIAGNOSIS — Z0000 Encounter for general adult medical examination without abnormal findings: Secondary | ICD-10-CM | POA: Diagnosis not present

## 2016-01-03 MED ORDER — CEFAZOLIN SODIUM-DEXTROSE 2-4 GM/100ML-% IV SOLN
2.0000 g | INTRAVENOUS | Status: AC
  Administered 2016-01-04: 2 g via INTRAVENOUS
  Filled 2016-01-03: qty 100

## 2016-01-04 ENCOUNTER — Ambulatory Visit (HOSPITAL_COMMUNITY)
Admission: RE | Admit: 2016-01-04 | Discharge: 2016-01-04 | Disposition: A | Discharge status: Home / Self Care | Payer: Medicare HMO | Source: Ambulatory Visit | Attending: General Surgery | Admitting: General Surgery

## 2016-01-04 ENCOUNTER — Encounter (HOSPITAL_COMMUNITY): Payer: Self-pay | Admitting: *Deleted

## 2016-01-04 ENCOUNTER — Ambulatory Visit (HOSPITAL_COMMUNITY): Payer: Medicare HMO | Admitting: Certified Registered Nurse Anesthetist

## 2016-01-04 ENCOUNTER — Encounter (HOSPITAL_COMMUNITY)
Admission: RE | Disposition: A | Discharge status: Home / Self Care | Payer: Self-pay | Source: Ambulatory Visit | Attending: General Surgery

## 2016-01-04 ENCOUNTER — Ambulatory Visit (HOSPITAL_COMMUNITY): Payer: Medicare HMO | Admitting: Emergency Medicine

## 2016-01-04 DIAGNOSIS — Z96652 Presence of left artificial knee joint: Secondary | ICD-10-CM | POA: Insufficient documentation

## 2016-01-04 DIAGNOSIS — F419 Anxiety disorder, unspecified: Secondary | ICD-10-CM | POA: Insufficient documentation

## 2016-01-04 DIAGNOSIS — Z87891 Personal history of nicotine dependence: Secondary | ICD-10-CM | POA: Diagnosis not present

## 2016-01-04 DIAGNOSIS — C50511 Malignant neoplasm of lower-outer quadrant of right female breast: Secondary | ICD-10-CM

## 2016-01-04 DIAGNOSIS — G8918 Other acute postprocedural pain: Secondary | ICD-10-CM | POA: Diagnosis not present

## 2016-01-04 DIAGNOSIS — E039 Hypothyroidism, unspecified: Secondary | ICD-10-CM | POA: Insufficient documentation

## 2016-01-04 DIAGNOSIS — Z7982 Long term (current) use of aspirin: Secondary | ICD-10-CM | POA: Diagnosis not present

## 2016-01-04 DIAGNOSIS — Z8551 Personal history of malignant neoplasm of bladder: Secondary | ICD-10-CM | POA: Diagnosis not present

## 2016-01-04 DIAGNOSIS — I2510 Atherosclerotic heart disease of native coronary artery without angina pectoris: Secondary | ICD-10-CM | POA: Insufficient documentation

## 2016-01-04 DIAGNOSIS — Z170 Estrogen receptor positive status [ER+]: Secondary | ICD-10-CM | POA: Diagnosis not present

## 2016-01-04 DIAGNOSIS — C50911 Malignant neoplasm of unspecified site of right female breast: Secondary | ICD-10-CM | POA: Diagnosis not present

## 2016-01-04 DIAGNOSIS — Z955 Presence of coronary angioplasty implant and graft: Secondary | ICD-10-CM | POA: Diagnosis not present

## 2016-01-04 DIAGNOSIS — Z79899 Other long term (current) drug therapy: Secondary | ICD-10-CM | POA: Insufficient documentation

## 2016-01-04 DIAGNOSIS — C50111 Malignant neoplasm of central portion of right female breast: Secondary | ICD-10-CM | POA: Diagnosis not present

## 2016-01-04 DIAGNOSIS — E785 Hyperlipidemia, unspecified: Secondary | ICD-10-CM | POA: Insufficient documentation

## 2016-01-04 HISTORY — PX: RADIOACTIVE SEED GUIDED PARTIAL MASTECTOMY WITH AXILLARY SENTINEL LYMPH NODE BIOPSY: SHX6520

## 2016-01-04 SURGERY — RADIOACTIVE SEED GUIDED PARTIAL MASTECTOMY WITH AXILLARY SENTINEL LYMPH NODE BIOPSY
Anesthesia: General | Site: Breast | Laterality: Right

## 2016-01-04 MED ORDER — MIDAZOLAM HCL 2 MG/2ML IJ SOLN
INTRAMUSCULAR | Status: AC
  Filled 2016-01-04: qty 2

## 2016-01-04 MED ORDER — BUPIVACAINE-EPINEPHRINE (PF) 0.5% -1:200000 IJ SOLN
INTRAMUSCULAR | Status: DC | PRN
  Administered 2016-01-04: 30 mL via PERINEURAL

## 2016-01-04 MED ORDER — BUPIVACAINE-EPINEPHRINE 0.25% -1:200000 IJ SOLN
INTRAMUSCULAR | Status: DC | PRN
  Administered 2016-01-04: 28 mL

## 2016-01-04 MED ORDER — METOCLOPRAMIDE HCL 5 MG/ML IJ SOLN: 10.0000 mg | Freq: Once | INTRAMUSCULAR | Status: DC | PRN

## 2016-01-04 MED ORDER — SODIUM CHLORIDE 0.9 % IJ SOLN
INTRAMUSCULAR | Status: AC
Start: 2016-01-04 — End: 2016-01-04
  Filled 2016-01-04: qty 10

## 2016-01-04 MED ORDER — FENTANYL CITRATE (PF) 100 MCG/2ML IJ SOLN
INTRAMUSCULAR | Status: DC | PRN
  Administered 2016-01-04: 25 ug via INTRAVENOUS
  Administered 2016-01-04 (×3): 50 ug via INTRAVENOUS
  Administered 2016-01-04: 25 ug via INTRAVENOUS
  Administered 2016-01-04: 50 ug via INTRAVENOUS

## 2016-01-04 MED ORDER — FENTANYL CITRATE (PF) 250 MCG/5ML IJ SOLN
INTRAMUSCULAR | Status: AC
  Filled 2016-01-04: qty 5

## 2016-01-04 MED ORDER — BUPIVACAINE-EPINEPHRINE (PF) 0.25% -1:200000 IJ SOLN
INTRAMUSCULAR | Status: AC
  Filled 2016-01-04: qty 30

## 2016-01-04 MED ORDER — BUPIVACAINE-EPINEPHRINE (PF) 0.25% -1:200000 IJ SOLN
INTRAMUSCULAR | Status: AC
Start: 2016-01-04 — End: 2016-01-04
  Filled 2016-01-04: qty 30

## 2016-01-04 MED ORDER — FENTANYL CITRATE (PF) 100 MCG/2ML IJ SOLN: 25.0000 ug | INTRAMUSCULAR | Status: DC | PRN

## 2016-01-04 MED ORDER — LIDOCAINE HCL (CARDIAC) 20 MG/ML IV SOLN
INTRAVENOUS | Status: DC | PRN
  Administered 2016-01-04: 100 mg via INTRAVENOUS

## 2016-01-04 MED ORDER — TECHNETIUM TC 99M SULFUR COLLOID FILTERED
1.0000 | Freq: Once | INTRAVENOUS | Status: AC | PRN
  Administered 2016-01-04: 1 via INTRADERMAL

## 2016-01-04 MED ORDER — PROPOFOL 10 MG/ML IV BOLUS
INTRAVENOUS | Status: AC
  Filled 2016-01-04: qty 40

## 2016-01-04 MED ORDER — MEPERIDINE HCL 25 MG/ML IJ SOLN: 6.2500 mg | INTRAMUSCULAR | Status: DC | PRN

## 2016-01-04 MED ORDER — CHLORHEXIDINE GLUCONATE 4 % EX LIQD: 1.0000 "application " | Freq: Once | CUTANEOUS | Status: DC

## 2016-01-04 MED ORDER — ONDANSETRON HCL 4 MG/2ML IJ SOLN
INTRAMUSCULAR | Status: DC | PRN
  Administered 2016-01-04: 4 mg via INTRAVENOUS

## 2016-01-04 MED ORDER — BUPIVACAINE-EPINEPHRINE (PF) 0.5% -1:200000 IJ SOLN
INTRAMUSCULAR | Status: AC
  Filled 2016-01-04: qty 30

## 2016-01-04 MED ORDER — OXYCODONE-ACETAMINOPHEN 5-325 MG PO TABS: 1.0000 | ORAL_TABLET | ORAL | Status: DC | PRN

## 2016-01-04 MED ORDER — MIDAZOLAM HCL 5 MG/5ML IJ SOLN
INTRAMUSCULAR | Status: DC | PRN
  Administered 2016-01-04 (×2): .5 mg via INTRAVENOUS
  Administered 2016-01-04: 1 mg via INTRAVENOUS

## 2016-01-04 MED ORDER — PROPOFOL 10 MG/ML IV BOLUS
INTRAVENOUS | Status: DC | PRN
  Administered 2016-01-04: 140 mg via INTRAVENOUS

## 2016-01-04 MED ORDER — DEXAMETHASONE SODIUM PHOSPHATE 4 MG/ML IJ SOLN
INTRAMUSCULAR | Status: DC | PRN
  Administered 2016-01-04: 4 mg via INTRAVENOUS

## 2016-01-04 MED ORDER — LACTATED RINGERS IV SOLN
INTRAVENOUS | Status: DC | PRN
  Administered 2016-01-04 (×2): via INTRAVENOUS

## 2016-01-04 MED ORDER — METHYLENE BLUE 0.5 % INJ SOLN
INTRAVENOUS | Status: AC
  Filled 2016-01-04: qty 10

## 2016-01-04 MED ORDER — 0.9 % SODIUM CHLORIDE (POUR BTL) OPTIME
TOPICAL | Status: DC | PRN
  Administered 2016-01-04: 1000 mL

## 2016-01-04 SURGICAL SUPPLY — 36 items
APPLIER CLIP 9.375 MED OPEN (MISCELLANEOUS) ×2
APR CLP MED 9.3 20 MLT OPN (MISCELLANEOUS) ×1
BINDER BREAST LRG (GAUZE/BANDAGES/DRESSINGS) IMPLANT
BINDER BREAST XLRG (GAUZE/BANDAGES/DRESSINGS) ×2 IMPLANT
BLADE SURG 15 STRL LF DISP TIS (BLADE) ×2 IMPLANT
BLADE SURG 15 STRL SS (BLADE) ×4
CANISTER SUCTION 2500CC (MISCELLANEOUS) ×2 IMPLANT
CHLORAPREP W/TINT 26ML (MISCELLANEOUS) ×2 IMPLANT
CLIP APPLIE 9.375 MED OPEN (MISCELLANEOUS) ×1 IMPLANT
CONT SPEC 4OZ CLIKSEAL STRL BL (MISCELLANEOUS) ×6 IMPLANT
COVER PROBE W GEL 5X96 (DRAPES) ×2 IMPLANT
COVER SURGICAL LIGHT HANDLE (MISCELLANEOUS) ×2 IMPLANT
DEVICE DUBIN SPECIMEN MAMMOGRA (MISCELLANEOUS) ×2 IMPLANT
DRAPE CHEST BREAST 15X10 FENES (DRAPES) ×2 IMPLANT
DRAPE UTILITY XL STRL (DRAPES) ×2 IMPLANT
ELECT COATED BLADE 2.86 ST (ELECTRODE) ×2 IMPLANT
ELECT REM PT RETURN 9FT ADLT (ELECTROSURGICAL) ×2
ELECTRODE REM PT RTRN 9FT ADLT (ELECTROSURGICAL) ×1 IMPLANT
GLOVE BIO SURGEON STRL SZ7.5 (GLOVE) ×4 IMPLANT
GOWN STRL REUS W/ TWL LRG LVL3 (GOWN DISPOSABLE) ×2 IMPLANT
GOWN STRL REUS W/TWL LRG LVL3 (GOWN DISPOSABLE) ×4
KIT BASIN OR (CUSTOM PROCEDURE TRAY) ×2 IMPLANT
KIT MARKER MARGIN INK (KITS) ×2 IMPLANT
LIQUID BAND (GAUZE/BANDAGES/DRESSINGS) ×2 IMPLANT
NEEDLE HYPO 25X1 1.5 SAFETY (NEEDLE) ×2 IMPLANT
NS IRRIG 1000ML POUR BTL (IV SOLUTION) ×2 IMPLANT
PACK SURGICAL SETUP 50X90 (CUSTOM PROCEDURE TRAY) ×2 IMPLANT
PENCIL BUTTON HOLSTER BLD 10FT (ELECTRODE) ×2 IMPLANT
SPONGE LAP 18X18 X RAY DECT (DISPOSABLE) ×2 IMPLANT
SUT MNCRL AB 4-0 PS2 18 (SUTURE) ×4 IMPLANT
SUT VIC AB 3-0 SH 18 (SUTURE) ×2 IMPLANT
SYR BULB 3OZ (MISCELLANEOUS) ×2 IMPLANT
SYR CONTROL 10ML LL (SYRINGE) ×2 IMPLANT
TOWEL OR 17X24 6PK STRL BLUE (TOWEL DISPOSABLE) ×2 IMPLANT
TUBE CONNECTING 12X1/4 (SUCTIONS) ×2 IMPLANT
YANKAUER SUCT BULB TIP NO VENT (SUCTIONS) ×2 IMPLANT

## 2016-01-04 NOTE — Anesthesia Postprocedure Evaluation (Signed)
Anesthesia Post Note  Patient: [place]  Procedure(s) Performed: Procedure(s) (LRB): RADIOACTIVE SEED GUIDED PARTIAL MASTECTOMY WITH AXILLARY SENTINEL LYMPH NODE BIOPSY (Right)  Patient location during evaluation: PACU Anesthesia Type: General and Regional Level of consciousness: awake and alert Pain management: pain level controlled Vital Signs Assessment: post-procedure vital signs reviewed and stable Respiratory status: spontaneous breathing, nonlabored ventilation, respiratory function stable and patient connected to nasal cannula oxygen Cardiovascular status: blood pressure returned to baseline and stable Postop Assessment: no signs of nausea or vomiting Anesthetic complications: no    Last Vitals:  Filed Vitals:   01/04/16 1059 01/04/16 1105  BP:  145/81  Pulse:  92  Temp: 36.4 C   Resp:  18    Last Pain:  Filed Vitals:   01/04/16 1134  PainSc: 0-No pain                 Montez Hageman

## 2016-01-04 NOTE — Interval H&P Note (Signed)
History and Physical Interval Note:  01/04/2016 8:21 AM  [name]  has presented today for surgery, with the diagnosis of RIGHT BREAST CANCER  The various methods of treatment have been discussed with the patient and family. After consideration of risks, benefits and other options for treatment, the patient has consented to  Procedure(s): RADIOACTIVE SEED GUIDED PARTIAL MASTECTOMY WITH AXILLARY SENTINEL LYMPH NODE BIOPSY (Right) as a surgical intervention .  The patient's history has been reviewed, patient examined, no change in status, stable for surgery.  I have reviewed the patient's chart and labs.  Questions were answered to the patient's satisfaction.     TOTH III,Yoshie Kosel S

## 2016-01-04 NOTE — H&P (Signed)
[name] is an 81 y.o. female.   Chief Complaint: right breast cancer HPI: The pt is a 81 yo female with a 3.2cm area of abnormal calcs in the lower outer quadrant of right breast. It is ER and PR + and her 2 - with Ki of 20%  Past Medical History  Diagnosis Date  . External hemorrhoids without mention of complication   . Diverticulosis of colon (without mention of hemorrhage)   . Phlebitis   . Arthritis   . Cataract     left eye  . Hyperlipidemia   . Thyroid disease     hypothyroidism  . Anxiety   . Insomnia   . Vitamin D insufficiency     Dr. Joan Flores  . Osteopenia     mild  . Constipation     predominant IBS-miralax and citrucel daily-has used linzess in the past. Dr Macky Lower)  . CAD (coronary artery disease) 2/10    S/P PCI (Stent) of LAD. Normal LVF-No angina  . MSSA (methicillin susceptible Staphylococcus aureus) 2011    buttock abscess  . Colonic stricture ([place]) 2013    and colon adenoma  . Squamous cell carcinoma, leg 2012    right, left  . IBS (irritable bowel syndrome)   . Bladder cancer ([place]) 2012  . Cancer of central portion of female breast, right 11/22/2015  . Breast cancer ([place])   . PONV (postoperative nausea and vomiting)     Past Surgical History  Procedure Laterality Date  . Hemorrhoid surgery    . Total knee arthroplasty  06/2000    Left  . Left elbow surgery x4    . Tonsillectomy    . Bladder cancer surgery  2012    Dr Zannie Cove  . Knee surgery      Age [age]  . Coronary angioplasty with stent placement  10/2008    Family History  Problem Relation Age of Onset  . Heart disease Father 39    Died of MI  . Heart attack Father 48  . Breast cancer      aunt  . Hiatal hernia Sister   . Breast cancer Sister 28  . Ulcers Mother     peptic  . Alzheimer's disease Mother   . Hypertension Brother   . Lung cancer Brother   . Colon cancer Neg Hx   . Esophageal cancer Neg Hx   . Rectal cancer Neg Hx   . Stomach cancer Neg Hx   . Coronary  artery disease Son 40    MI  . Stroke Son   . Coronary artery disease Paternal Uncle 40    Died with CAD  . Diabetes Maternal Grandmother   . Heart attack Maternal Grandfather     70's   Social History:  reports that she quit smoking about 31 years ago. Her smoking use included Cigarettes. She has never used smokeless tobacco. She reports that she drinks about 1.2 - 1.8 oz of alcohol per week. She reports that she does not use illicit drugs.  Allergies:  Allergies  Allergen Reactions  . Benadryl [Diphenhydramine Hcl] Other (See Comments)    hyperactivity  . Crestor [Rosuvastatin] Other (See Comments)    Muscle Aches  . Lipitor [Atorvastatin] Other (See Comments)    Muscle aches  . Morphine And Related Nausea And Vomiting  . Tramadol Nausea And Vomiting  . Adhesive [Tape] Other (See Comments)    Thin and sensitive skin, please use "paper" tape  . Cortisone Rash  .  Sulfonamide Derivatives Hives    Medications Prior to Admission  Medication Sig Dispense Refill  . acetaminophen (TYLENOL) 500 MG tablet Take 500 mg by mouth daily as needed for mild pain or headache.    . ALPRAZolam (XANAX) 0.5 MG tablet Take 0.5 mg by mouth daily as needed for anxiety.     Marland Kitchen aspirin EC 81 MG tablet Take 81 mg by mouth daily.    . Coenzyme Q10 (COQ-10) 200 MG CAPS Take 1 capsule by mouth daily.    Marland Kitchen ezetimibe (ZETIA) 10 MG tablet Take 1 tablet (10 mg total) by mouth daily. 30 tablet 11  . ibuprofen (ADVIL,MOTRIN) 200 MG tablet Take 200 mg by mouth daily as needed for moderate pain.    Marland Kitchen levothyroxine (SYNTHROID, LEVOTHROID) 50 MCG tablet Take 50 mcg by mouth daily.    . Magnesium Hydroxide (MAGNESIA PO) Take 1 tablet by mouth daily.    . Multiple Vitamins-Minerals (CENTRUM CARDIO PO) Take 1 tablet by mouth daily.    . mupirocin ointment (BACTROBAN) 2 % Apply 1 application topically daily.    . pravastatin (PRAVACHOL) 40 MG tablet Take 1 tablet (40 mg total) by mouth daily. 30 tablet 11  . psyllium  (HYDROCIL/METAMUCIL) 95 % PACK Take 1 packet by mouth daily.    Marland Kitchen zolpidem (AMBIEN) 10 MG tablet Take 5 mg by mouth At bedtime as needed for sleep. 1/2 tablet For sleep      No results found for this or any previous visit (from the past 48 hour(s)). No results found.  Review of Systems  Constitutional: Negative.   Eyes: Negative.   Respiratory: Negative.   Cardiovascular: Negative.   Gastrointestinal: Negative.   Genitourinary: Negative.   Musculoskeletal: Negative.   Skin: Negative.   Neurological: Negative.   Endo/Heme/Allergies: Negative.   Psychiatric/Behavioral: Negative.     Blood pressure 171/80, pulse 84, temperature 98 F (36.7 C), temperature source Oral, resp. rate 18, height 5\' 3"  (1.6 m), weight 73.483 kg (162 lb), SpO2 98 %. Physical Exam  Constitutional: She is oriented to person, place, and time. She appears well-developed and well-nourished.  HENT:  Head: Normocephalic and atraumatic.  Eyes: Conjunctivae and EOM are normal. Pupils are equal, round, and reactive to light.  Neck: Normal range of motion. Neck supple.  Cardiovascular: Normal rate, regular rhythm and normal heart sounds.   Respiratory: Effort normal and breath sounds normal.  There is no palpable mass in either breast. There is no palpable axillary, supraclavicular, or cervical lymphadenopathy  GI: Soft. Bowel sounds are normal.  Musculoskeletal: Normal range of motion.  Neurological: She is alert and oriented to person, place, and time.  Skin: Skin is warm and dry.  Psychiatric: She has a normal mood and affect. Her behavior is normal.     Assessment/Plan The pt has an invasive breast cancer in right breast. All options have been discussed and she wants breast conservation. Plan for right breast rsl lumpectomy and sentinel node mapping.  Merrie Roof, MD 01/04/2016, 8:14 AM   It

## 2016-01-04 NOTE — Anesthesia Procedure Notes (Addendum)
Anesthesia Regional Block:  Pectoralis block  Pre-Anesthetic Checklist: ,, timeout performed, Correct Patient, Correct Site, Correct Laterality, Correct Procedure, Correct Position, site marked, Risks and benefits discussed,  Surgical consent,  Pre-op evaluation,  At surgeon's request and post-op pain management  Laterality: Right and Upper  Prep: Maximum Sterile Barrier Precautions used and chloraprep       Needles:  Injection technique: Single-shot  Needle Type: Echogenic Stimulator Needle     Needle Length: 10cm 10 cm Needle Gauge: 21 and 21 G    Additional Needles:  Procedures: ultrasound guided (picture in chart) Pectoralis block Narrative:  Injection made incrementally with aspirations every 5 mL.  Performed by: Personally   Additional Notes: Risks, benefits and alternative to block explained extensively.  Patient tolerated procedure well, without complications.   Procedure Name: LMA Insertion Date/Time: 01/04/2016 8:52 AM Performed by: Merdis Delay Pre-anesthesia Checklist: Patient identified, Timeout performed, Emergency Drugs available, Patient being monitored and Suction available Patient Re-evaluated:Patient Re-evaluated prior to inductionOxygen Delivery Method: Circle system utilized Preoxygenation: Pre-oxygenation with 100% oxygen Intubation Type: IV induction LMA: LMA inserted LMA Size: 4.0 Number of attempts: 1 Placement Confirmation: positive ETCO2,  CO2 detector and breath sounds checked- equal and bilateral Tube secured with: Tape Dental Injury: Teeth and Oropharynx as per pre-operative assessment

## 2016-01-04 NOTE — Op Note (Signed)
01/04/2016  10:17 AM  PATIENT:  [name]  81 y.o. female  PRE-OPERATIVE DIAGNOSIS:  RIGHT BREAST CANCER  POST-OPERATIVE DIAGNOSIS:  RIGHT BREAST CANCER  PROCEDURE:  Procedure(s): RADIOACTIVE SEED GUIDED PARTIAL MASTECTOMY WITH AXILLARY SENTINEL LYMPH NODE BIOPSY (Right)  SURGEON:  Surgeon(s) and Role:    * Jovita Kussmaul, MD - Primary    * Judeth Horn, MD - Assisting  PHYSICIAN ASSISTANT:   ASSISTANTS: none   ANESTHESIA:   general  EBL:  Total I/O In: 850 [I.V.:850] Out: 10 [Blood:10]  BLOOD ADMINISTERED:none  DRAINS: none   LOCAL MEDICATIONS USED:  MARCAINE     SPECIMEN:  Source of Specimen:  right breast tissue and sentinel nodes X 5  DISPOSITION OF SPECIMEN:  PATHOLOGY  COUNTS:  YES  TOURNIQUET:  * No tourniquets in log *  DICTATION: .Dragon Dictation   After informed consent was obtained the patient was brought to the operating room and placed in the supine position on the operating room table. After adequate induction of general anesthesia the patient's right chest, breast, and axillary area were prepped with ChloraPrep, allowed to dry, and draped in usual sterile manner. An appropriate timeout was performed. Previously an I-125 seed was placed in the lower outer quadrant of the right breast to mark an area of invasive breast cancer. Earlier in the day the patient underwent injection of 1 mCi of technetium sulfur colloid in the subareolar position on the right. At this point the neoprobe was set to I-125. The area of the radioactive seed was readily identified in the lower outer quadrant. A radially oriented elliptical incision was made overlying the area of the radioactive seed with a 15 blade knife. The incision was carried through the skin and subcutaneous tissue sharply with electrocautery. While checking the area of the radioactive seed frequently with the neoprobe a circular portion of breast tissue was excised sharply around the radioactive seed. Once the  specimen was removed it was oriented with the appropriate paint colors. The anterior and posterior colors were transposed. A specimen radiograph was obtained that showed the clip in seed to be in the center of the specimen. The specimen was then sent to pathology for further evaluation. Hemostasis was achieved using the Bovie electrocautery. The wound was infiltrated with quarter percent Marcaine and irrigated with saline. The cavity was marked with clips. The deep layer of the wound was closed with layers of interrupted 3-0 Vicryl stitches. The skin was then closed with interrupted 4-0 Monocryl subcuticular stitches. Attention was then turned to the right axilla. The neoprobe was set to technetium. A hot spot was identified in the right axilla. A transversely oriented incision was made with a 15 blade knife overlying the hot spot. The incision was carried through the skin and subcutaneous tissue sharply with electrocautery until the axilla was entered. A [place] retractor was deployed. Using the neoprobe to direct blunt hemostat dissection I was able to identify 5 lymph nodes. These were excised sharply with electrocautery and the lymphatics were controlled with clips. Ex vivo counts on these 5 sentinel nodes ranged from [phone]. These were sent as sentinel nodes #1 through 5. Hemostasis was achieved using the Bovie electrocautery. The wound was infiltrated with quarter percent Marcaine and irrigated with saline. The deep layer of the wound was then closed with interrupted 3-0 Vicryl stitches. The skin was then closed with a running 4-0 Monocryl subcuticular stitch. Dermabond dressings were applied. The patient tolerated the procedure well. At the end of  the case all needle sponge and instrument counts were correct. The patient was then awakened and taken to recovery in stable condition.  PLAN OF CARE: Discharge to home after PACU  PATIENT DISPOSITION:  PACU - hemodynamically stable.   Delay start of  Pharmacological VTE agent (>24hrs) due to surgical blood loss or risk of bleeding: not applicable

## 2016-01-04 NOTE — Anesthesia Preprocedure Evaluation (Addendum)
Anesthesia Evaluation  Patient identified by MRN, date of birth, ID band Patient awake    Reviewed: Allergy & Precautions, NPO status , Patient's Chart, lab work & pertinent test results  History of Anesthesia Complications (+) PONV  Airway Mallampati: II  TM Distance: >3 FB Neck ROM: Full    Dental no notable dental hx. (+) Teeth Intact, Dental Advisory Given   Pulmonary former smoker,    Pulmonary exam normal breath sounds clear to auscultation       Cardiovascular Exercise Tolerance: Good + CAD and + Cardiac Stents (2010)  Normal cardiovascular exam Rhythm:Regular Rate:Normal     Neuro/Psych negative neurological ROS  negative psych ROS   GI/Hepatic negative GI ROS, Neg liver ROS,   Endo/Other  negative endocrine ROS  Renal/GU negative Renal ROS  negative genitourinary   Musculoskeletal negative musculoskeletal ROS (+)   Abdominal   Peds negative pediatric ROS (+)  Hematology negative hematology ROS (+)   Anesthesia Other Findings   Reproductive/Obstetrics negative OB ROS                           Anesthesia Physical Anesthesia Plan  ASA: III  Anesthesia Plan: General   Post-op Pain Management: GA combined w/ Regional for post-op pain   Induction: Intravenous  Airway Management Planned: LMA  Additional Equipment:   Intra-op Plan:   Post-operative Plan: Extubation in OR  Informed Consent: I have reviewed the patients History and Physical, chart, labs and discussed the procedure including the risks, benefits and alternatives for the proposed anesthesia with the patient or authorized representative who has indicated his/her understanding and acceptance.   Dental advisory given  Plan Discussed with: CRNA  Anesthesia Plan Comments: (Pec block)        Anesthesia Quick Evaluation

## 2016-01-04 NOTE — Transfer of Care (Signed)
Immediate Anesthesia Transfer of Care Note  Patient: [place]  Procedure(s) Performed: Procedure(s): RADIOACTIVE SEED GUIDED PARTIAL MASTECTOMY WITH AXILLARY SENTINEL LYMPH NODE BIOPSY (Right)  Patient Location: PACU  Anesthesia Type:General  Level of Consciousness: awake, alert  and oriented  Airway & Oxygen Therapy: Patient Spontanous Breathing  Post-op Assessment: Report given to RN and Post -op Vital signs reviewed and stable  Post vital signs: Reviewed and stable  Last Vitals:  Filed Vitals:   01/04/16 0651 01/04/16 0655  BP: 171/80 171/80  Pulse: 84 84  Temp: 36.7 C 36.7 C  Resp: 18 18    Last Pain:  Filed Vitals:   01/04/16 0656  PainSc: 3       Patients Stated Pain Goal: 2 ([id number])  Complications: No apparent anesthesia complications

## 2016-01-05 ENCOUNTER — Encounter (HOSPITAL_COMMUNITY): Payer: Self-pay | Admitting: General Surgery

## 2016-01-18 NOTE — Progress Notes (Addendum)
Location of Breast Cancer:Central portion of right female breast  Histology per Pathology Report: Diagnosis 01-04-16 1. Breast, lumpectomy, Right - MICROSCOPIC FOCUS OF INVASIVE DUCTAL CARCINOMA (LESS THAN 0.1 CM) WITH ASSOCIATED EXTENSIVE HIGH GRADE DUCTAL CARCINOMA IN SITU. - HIGH GRADE DUCTAL CARCINOMA IN SITU SHOWS COMEDONECROSIS AND HAS ASSOCIATED CALCIFICATIONS. - DUCTAL CARCINOMA IN SITU IS FOCALLY LESS THAN 0.1 CM TO SUPERIOR MARGIN. - OTHER MARGINS ARE NEGATIVE. - SEE ONCOLOGY TEMPLATE AND COMMENT. 2. Lymph node, sentinel, biopsy, Right Axillary #1 - ONE BENIGN LYMPH NODE WITH NO TUMOR SEEN (0/1). 3. Lymph node, sentinel, biopsy, Right Axillary #2 - ONE BENIGN LYMPH NODE WITH NO TUMOR SEEN (0/1). 4. Lymph node, sentinel, biopsy, Right - ONE BENIGN LYMPH NODE WITH NO TUMOR SEEN (0/1). 5. Lymph node, sentinel, biopsy, Right Axillary #3 - ONE BENIGN LYMPH NODE WITH NO TUMOR SEEN (0/1). 6. Lymph node, sentinel, biopsy, Right Axillary #4 - ONE BENIGN LYMPH NODE WITH NO TUMOR SEEN (0/1). 7. Lymph node, sentinel, biopsy, Right Axillary #5 - ONE BENIGN LYMPH NODE WITH NO TUMOR SEEN (0/1). 8. Lymph node, sentinel, biopsy, Right - ONE BENIGN LYMPH NODE WITH NO TUMOR SEEN (0/1). 9. Lymph node, sentinel, biopsy, Right - ONE BENIGN LYMPH NODE WITH NO TUMOR SEEN (0/1). 10. Lymph node, sentinel, biopsy, Right - ONE BENIGN LYMPH NODE WITH NO TUMOR SEEN (0/1). Receptor Status: ER(100% +, PR (5%+), Her2-neu (-), Ki-(20%)  [name] present with abnormal calcification in the right breast  found on screening mammography.   Past/Anticipated interventions by surgeon, if any: Right breast biopsy  Past/Anticipated interventions by medical oncology, if XIP:PNDLOPRAF therapy referral,Anastrozole x 5 years  Lymphedema issues, if any: no  Pain issues, if any: reports having some tenderness in her right breast.  SAFETY ISSUES:  Prior radiation? :  no  Pacemaker/ICD? :  no  Possible current  pregnancy?: No  Is the patient on methotrexate? : No  Current Complaints / other details: Patient is here with her husband.  Vitals and weight were taken in Dr. Geralyn Flash office today.

## 2016-01-19 ENCOUNTER — Telehealth: Payer: Self-pay | Admitting: Hematology and Oncology

## 2016-01-19 ENCOUNTER — Ambulatory Visit
Admission: RE | Admit: 2016-01-19 | Discharge: 2016-01-19 | Disposition: A | Discharge status: Home / Self Care | Payer: Medicare HMO | Source: Ambulatory Visit | Attending: Radiation Oncology | Admitting: Radiation Oncology

## 2016-01-19 ENCOUNTER — Encounter: Payer: Self-pay | Admitting: Hematology and Oncology

## 2016-01-19 ENCOUNTER — Ambulatory Visit (HOSPITAL_BASED_OUTPATIENT_CLINIC_OR_DEPARTMENT_OTHER): Payer: Medicare HMO | Admitting: Hematology and Oncology

## 2016-01-19 VITALS — BP 148/77 | HR 94 | Temp 98.0°F | Resp 18 | Ht 63.0 in | Wt 161.9 lb

## 2016-01-19 DIAGNOSIS — C50111 Malignant neoplasm of central portion of right female breast: Secondary | ICD-10-CM

## 2016-01-19 DIAGNOSIS — C50911 Malignant neoplasm of unspecified site of right female breast: Secondary | ICD-10-CM | POA: Insufficient documentation

## 2016-01-19 DIAGNOSIS — Z170 Estrogen receptor positive status [ER+]: Secondary | ICD-10-CM

## 2016-01-19 DIAGNOSIS — D0511 Intraductal carcinoma in situ of right breast: Secondary | ICD-10-CM | POA: Diagnosis not present

## 2016-01-19 NOTE — Telephone Encounter (Signed)
appt made and avs printed °

## 2016-01-19 NOTE — Progress Notes (Signed)
   Department of Radiation Oncology  Phone:  [phone] Fax:        [phone]   Name: [name] MRN: [id number]  DOB: Aug 28, 1935  Date: 01/19/2016  Follow Up Visit Note  Diagnosis: T1aN0 Invasive Ductal Carcinoma of the Right Breast  Summary and Interval since last radiation: N/A  Interval History: [name] presents today for routine followup. She had her lumpectomy on 4/26. This showed high grade DCIS with an area of microinvasion. 0/9 lymph nodes were involved. She has healed up well from surgery and is ready to begin radiation.  The patient saw Dr. Lindi Adie before this encounter. He recommended antiestrogen therapy with tamoxifen for the next 5 years.  The patient expressed concerns regarding radiation therapy. She is worried that her skin is extremely sensitive and would be sensitive to the effects of radiation. The patient reports she has excellent range of motion of her bilateral upper extremities. She reports pain in the site where she received her nerve block injection.  Physical Exam:  There were no vitals filed for this visit.  Vitals with BMI 01/19/2016  Height 5\' 3"   Weight 161 lbs 14 oz  BMI 0000000  Systolic [id number]  Diastolic 77  Pulse 94  Respirations 18   Alert and oriented x3. In no acute distress. A breast exam was deferred encounter.  IMPRESSION: [name] is a 81 y.o. female with T1aN0 Invasive Ductal Carcinoma of the Right Breast.  PLAN: I spoke to the patient today regarding her diagnosis and options for treatment. We discussed the equivalence in terms of survival and local failure between mastectomy and breast conservation. We discussed the role of radiation in decreasing local failures in patients who undergo lumpectomy. We discussed the low rates of failure in patients her age with these non aggressive breast cancers. We discussed the randomized trials in elderly women showing equivalent survival when omitting raidiaton and taking an AI instead. We discussed  the randomized trials showing a slight improvement in local control with the combination.We discussed the differences between systemic and local treatment. She is unsure what she wants to do.  We discussed the process of simulation and the placement tattoos.  We discussed the possibility of asymptomatic lung damage. We discussed the low likelihood of secondary malignancies. We discussed the possible side effects including but not limited to skin redness, fatigue, permanent skin darkening, and breast swelling.   The patient and her husband would like some time to think about proceeding with radiation. She will call our clinic of her decision in the near future.  Sally Silversmith, MD  This document serves as a record of services personally performed by Sally Silversmith, MD. It was created on her behalf by Darcus Austin, a trained medical scribe. The creation of this record is based on the scribe's personal observations and the provider's statements to them. This document has been checked and approved by the attending provider.

## 2016-01-19 NOTE — Progress Notes (Signed)
Please see the Nurse Progress Note in the MD Initial Consult Encounter for this patient. 

## 2016-01-19 NOTE — Progress Notes (Signed)
Patient Care Team: Seward Carol, MD as PCP - General (Internal Medicine) Sylvan Cheese, NP as Nurse Practitioner (Hematology and Oncology)  DIAGNOSIS: Cancer of central portion of female breast, right   Staging form: Breast, AJCC 7th Edition     Clinical stage from 11/23/2015: Stage IIA (T2, N0, M0) - Unsigned       Staging comments: Staged at breast conference on 3.15.17  SUMMARY OF ONCOLOGIC HISTORY:   Cancer of central portion of female breast, right   11/08/2015 Mammogram Right breast 3.2 cm group of pleomorphic calcifications, breast density B, T2 N0 stage II a clinical stage (unclear how much of the calcification is invasive ductal carcinoma versus DCIS)   11/14/2015 Initial Diagnosis Right breast core biopsy: Small focus of Invasive ductal carcinoma with high-grade DCIS with comedonecrosis, ER 100%, PR 5%, Ki-67 20%, HER-2 negative   01/04/2016 Surgery Right lumpectomy: Microscopic focus of invasive ductal carcinoma less than 0.1 cm with high-grade DCIS, comedo necrosis, less than 0.1 cm the superior margin, 0/9 LN negative, grade 2, ER 100%, PR 5%, HER-2 negative ratio 1.72, Ki-67 20%, T1 N0 stage IA    CHIEF COMPLIANT: Follow-up after surgery  INTERVAL HISTORY: [name] is a 81 year old with above-mentioned history of right breast DCIS who underwent a lumpectomy and is here to discuss the pathology report. She reports that the surgical incisions are healing very well.  REVIEW OF SYSTEMS:   Constitutional: Denies fevers, chills or abnormal weight loss Eyes: Denies blurriness of vision Ears, nose, mouth, throat, and face: Denies mucositis or sore throat Respiratory: Denies cough, dyspnea or wheezes Cardiovascular: Denies palpitation, chest discomfort Gastrointestinal:  Denies nausea, heartburn or change in bowel habits Skin: Denies abnormal skin rashes Lymphatics: Denies new lymphadenopathy or easy bruising Neurological:Denies numbness, tingling or new  weaknesses Behavioral/Psych: Mood is stable, no new changes  Extremities: No lower extremity edema Breast: Recent lumpectomy All other systems were reviewed with the patient and are negative.  I have reviewed the past medical history, past surgical history, social history and family history with the patient and they are unchanged from previous note.  ALLERGIES:  is allergic to benadryl; crestor; lipitor; morphine and related; tramadol; adhesive; cortisone; and sulfonamide derivatives.  MEDICATIONS:  Current Outpatient Prescriptions  Medication Sig Dispense Refill  . acetaminophen (TYLENOL) 500 MG tablet Take 500 mg by mouth daily as needed for mild pain or headache.    . ALPRAZolam (XANAX) 0.5 MG tablet Take 0.5 mg by mouth daily as needed for anxiety.     Marland Kitchen aspirin EC 81 MG tablet Take 81 mg by mouth daily.    . Coenzyme Q10 (COQ-10) 200 MG CAPS Take 1 capsule by mouth daily.    Marland Kitchen ezetimibe (ZETIA) 10 MG tablet Take 1 tablet (10 mg total) by mouth daily. 30 tablet 11  . ibuprofen (ADVIL,MOTRIN) 200 MG tablet Take 200 mg by mouth daily as needed for moderate pain.    Marland Kitchen levothyroxine (SYNTHROID, LEVOTHROID) 50 MCG tablet Take 50 mcg by mouth daily.    . Magnesium Hydroxide (MAGNESIA PO) Take 1 tablet by mouth daily.    . Multiple Vitamins-Minerals (CENTRUM CARDIO PO) Take 1 tablet by mouth daily.    . mupirocin ointment (BACTROBAN) 2 % Apply 1 application topically daily.    Marland Kitchen oxyCODONE-acetaminophen (ROXICET) 5-325 MG tablet Take 1-2 tablets by mouth every 4 (four) hours as needed. 30 tablet 0  . pravastatin (PRAVACHOL) 40 MG tablet Take 1 tablet (40 mg total) by mouth daily.  30 tablet 11  . psyllium (HYDROCIL/METAMUCIL) 95 % PACK Take 1 packet by mouth daily.    Marland Kitchen zolpidem (AMBIEN) 10 MG tablet Take 5 mg by mouth At bedtime as needed for sleep. 1/2 tablet For sleep     No current facility-administered medications for this visit.    PHYSICAL EXAMINATION: ECOG PERFORMANCE STATUS: 1 -  Symptomatic but completely ambulatory  Filed Vitals:   01/19/16 0933  BP: 148/77  Pulse: 94  Temp: 98 F (36.7 C)  Resp: 18   Filed Weights   01/19/16 0933  Weight: 161 lb 14.4 oz (73.437 kg)    GENERAL:alert, no distress and comfortable SKIN: skin color, texture, turgor are normal, no rashes or significant lesions EYES: normal, Conjunctiva are pink and non-injected, sclera clear OROPHARYNX:no exudate, no erythema and lips, buccal mucosa, and tongue normal  NECK: supple, thyroid normal size, non-tender, without nodularity LYMPH:  no palpable lymphadenopathy in the cervical, axillary or inguinal LUNGS: clear to auscultation and percussion with normal breathing effort HEART: regular rate & rhythm and no murmurs and no lower extremity edema ABDOMEN:abdomen soft, non-tender and normal bowel sounds MUSCULOSKELETAL:no cyanosis of digits and no clubbing  NEURO: alert & oriented x 3 with fluent speech, no focal motor/sensory deficits EXTREMITIES: No lower extremity edema   LABORATORY DATA:  I have reviewed the data as listed   Chemistry      Component Value Date/Time   NA 142 12/27/2015 1048   NA 139 11/23/2015 0810   K 4.3 12/27/2015 1048   K 4.2 11/23/2015 0810   CL 109 12/27/2015 1048   CO2 24 12/27/2015 1048   CO2 25 11/23/2015 0810   BUN 15 12/27/2015 1048   BUN 21.8 11/23/2015 0810   CREATININE 1.00 12/27/2015 1048   CREATININE 0.9 11/23/2015 0810      Component Value Date/Time   CALCIUM 9.1 12/27/2015 1048   CALCIUM 9.3 11/23/2015 0810   ALKPHOS 78 11/30/2015 0900   ALKPHOS 84 11/23/2015 0810   AST 21 11/30/2015 0900   AST 21 11/23/2015 0810   ALT 22 11/30/2015 0900   ALT 25 11/23/2015 0810   BILITOT 0.6 11/30/2015 0900   BILITOT 0.48 11/23/2015 0810       Lab Results  Component Value Date   WBC 9.1 12/27/2015   HGB 13.2 12/27/2015   HCT 41.5 12/27/2015   MCV 96.1 12/27/2015   PLT 201 12/27/2015   NEUTROABS 5.8 11/23/2015     ASSESSMENT & PLAN:   Cancer of central portion of female breast, right Right lumpectomy 01/04/2016: Microscopic focus of invasive ductal carcinoma less than 0.1 cm with high-grade DCIS, comedo necrosis, less than 0.1 cm the superior margin, 0/9 LN negative, grade 2, ER 100%, PR 5%, HER-2 negative ratio 1.72, Ki-67 20%, T1 N0 stage IA   Pathology counseling: I discussed the final pathology report of the patient provided  a copy of this report. I discussed the margins as well as lymph node surgeries. We also discussed the final staging along with previously performed ER/PR and HER-2/neu testing.  Recommendation: Adjuvant radiation therapy followed by Adjuvant antiestrogen therapy with tamoxifen 20 mg daily 5 years   Patient expressed concerns regarding radiation therapy. She is worried that her skin is extremely sensitive and was hoping to discuss with radiation oncology about not doing radiation. I discussed with her about her high-grade histology and the presence of comedo necrosis. Radiation could definitely help decrease local recurrence.  After the radiation appointment she will take time  to think about it and make a final decision. She will let me know about her decision so that I can then consider starting tamoxifen if she was not getting immediate radiation.  Return to clinic in 4 months for follow-up  No orders of the defined types were placed in this encounter.   The patient has a good understanding of the overall plan. she agrees with it. she will call with any problems that may develop before the next visit here.   Rulon Eisenmenger, MD 01/19/2016

## 2016-01-19 NOTE — Assessment & Plan Note (Signed)
Right lumpectomy 01/04/2016: Microscopic focus of invasive ductal carcinoma less than 0.1 cm with high-grade DCIS, comedo necrosis, less than 0.1 cm the superior margin, 0/9 LN negative, grade 2, ER 100%, PR 5%, HER-2 negative ratio 1.72, Ki-67 20%, T1 N0 stage IA   Pathology counseling: I discussed the final pathology report of the patient provided  a copy of this report. I discussed the margins as well as lymph node surgeries. We also discussed the final staging along with previously performed ER/PR and HER-2/neu testing.  Recommendation: Adjuvant radiation therapy followed by Adjuvant antiestrogen therapy with anastrozole 1 mg daily 5 years   Return to clinic in 3 months to start antiestrogen therapy

## 2016-01-20 ENCOUNTER — Ambulatory Visit: Payer: Medicare HMO | Admitting: Hematology and Oncology

## 2016-01-20 NOTE — Addendum Note (Signed)
Encounter addended by: Malena Edman, RN on: 01/20/2016  5:02 PM<BR>     Documentation filed: Charges VN

## 2016-01-24 ENCOUNTER — Other Ambulatory Visit: Payer: Medicare HMO

## 2016-01-25 ENCOUNTER — Other Ambulatory Visit (INDEPENDENT_AMBULATORY_CARE_PROVIDER_SITE_OTHER): Payer: Medicare HMO | Admitting: *Deleted

## 2016-01-25 DIAGNOSIS — E785 Hyperlipidemia, unspecified: Secondary | ICD-10-CM

## 2016-01-25 DIAGNOSIS — N281 Cyst of kidney, acquired: Secondary | ICD-10-CM | POA: Diagnosis not present

## 2016-01-25 DIAGNOSIS — Z0000 Encounter for general adult medical examination without abnormal findings: Secondary | ICD-10-CM | POA: Diagnosis not present

## 2016-01-25 DIAGNOSIS — C674 Malignant neoplasm of posterior wall of bladder: Secondary | ICD-10-CM | POA: Diagnosis not present

## 2016-01-25 LAB — LIPID PANEL
CHOL/HDL RATIO: 1.8 ratio (ref <=5.0)
Cholesterol: 160 mg/dL (ref 125–200)
HDL: 89 mg/dL (ref >=46)
LDL Cholesterol: 54 mg/dL (ref <130)
Triglycerides: 85 mg/dL (ref <150)
VLDL: 17 mg/dL (ref <30)

## 2016-01-25 LAB — HEPATIC FUNCTION PANEL
ALK PHOS: 72 U/L (ref 33–130)
ALT: 16 U/L (ref 6–29)
AST: 18 U/L (ref 10–35)
Albumin: 4 g/dL (ref 3.6–5.1)
BILIRUBIN DIRECT: 0.1 mg/dL (ref <=0.2)
BILIRUBIN TOTAL: 0.6 mg/dL (ref 0.2–1.2)
Indirect Bilirubin: 0.5 mg/dL (ref 0.2–1.2)
Total Protein: 6.2 g/dL (ref 6.1–8.1)

## 2016-01-27 ENCOUNTER — Telehealth: Payer: Self-pay | Admitting: *Deleted

## 2016-01-27 NOTE — Telephone Encounter (Signed)
Left vm for pt to return call to discuss her decision on xrt. Contact information provided.

## 2016-02-01 ENCOUNTER — Other Ambulatory Visit: Payer: Self-pay

## 2016-02-01 DIAGNOSIS — C50111 Malignant neoplasm of central portion of right female breast: Secondary | ICD-10-CM

## 2016-02-01 MED ORDER — TAMOXIFEN CITRATE 20 MG PO TABS: ORAL_TABLET | ORAL | Status: DC

## 2016-02-01 NOTE — Progress Notes (Signed)
Received VM from pt informing us of her treatment decisions.  Pt states she does not want to pursue radiation; however, she does want to start tamoxifen per her last discussion with Dr. Lindi Adie.  Discussed this with Dr. Lindi Adie who is in agreement to start pt on Tamoxifen 20mg  daily.  Called pt back to confirm pharmacy and to inform her this prescription would be called in.  Pt had some questions regarding PT exercises.  I informed pt to contact PT for more specifics.  Pt verbalized understanding.  Instructed pt to return in 3 months for follow up with Dr. Lindi Adie as well as to notify us of any questions or concerns should they arise after initiating Tamoxifen.  Pt without further questions at time of call.  Prescription sent to [facility] per pt request.

## 2016-02-03 ENCOUNTER — Telehealth: Payer: Self-pay | Admitting: *Deleted

## 2016-02-03 NOTE — Telephone Encounter (Signed)
Left message regarding her radiation decision.

## 2016-02-09 ENCOUNTER — Other Ambulatory Visit: Payer: Self-pay

## 2016-02-09 MED ORDER — ANASTROZOLE 1 MG PO TABS: 1.0000 mg | ORAL_TABLET | Freq: Every day | ORAL | Status: DC

## 2016-02-09 NOTE — Progress Notes (Signed)
After speaking with Dr. Lindi Adie regarding pts history of DVT, pt switched to anastrozole 1mg  daily.  Called pt to discuss this.  Pt verbalized understanding and without further questions at time of call.  Prescription called in to [facility] per pt request.

## 2016-02-10 ENCOUNTER — Other Ambulatory Visit: Payer: Self-pay | Admitting: *Deleted

## 2016-02-10 ENCOUNTER — Telehealth: Payer: Self-pay | Admitting: Nurse Practitioner

## 2016-02-10 DIAGNOSIS — C50111 Malignant neoplasm of central portion of right female breast: Secondary | ICD-10-CM

## 2016-02-10 NOTE — Progress Notes (Signed)
Spoke with patient and she states she has decided not to do radiation.  Dr. Lindi Adie started her on Arimidex and I discussed survivorship program and have made that referral.  Encouraged her to call with any needs or concerns.

## 2016-02-10 NOTE — Telephone Encounter (Signed)
lvm to inform patient of survivorship appt 8/4 per 6/1 pof

## 2016-02-15 ENCOUNTER — Telehealth: Payer: Self-pay | Admitting: *Deleted

## 2016-02-15 NOTE — Telephone Encounter (Signed)
Informed patient of normal results and verbal understanding expressed.   Confirmed patient is taking Pravastatin 40 mg daily.  Instructed her to continue current treatment plan.

## 2016-02-15 NOTE — Telephone Encounter (Signed)
Patient stated that she had lab work done here and has not received a call with the results. She also has a question about a medication that she was told that the dose may change based on the results of her labs. She can be reached at [phone]. Thanks, MI

## 2016-02-22 ENCOUNTER — Ambulatory Visit (INDEPENDENT_AMBULATORY_CARE_PROVIDER_SITE_OTHER): Payer: Medicare HMO | Admitting: Certified Nurse Midwife

## 2016-02-22 ENCOUNTER — Encounter: Payer: Self-pay | Admitting: Certified Nurse Midwife

## 2016-02-22 VITALS — BP 118/78 | HR 80 | Resp 20 | Ht 62.5 in | Wt 162.0 lb

## 2016-02-22 DIAGNOSIS — Z01419 Encounter for gynecological examination (general) (routine) without abnormal findings: Secondary | ICD-10-CM | POA: Diagnosis not present

## 2016-02-22 DIAGNOSIS — Z124 Encounter for screening for malignant neoplasm of cervix: Secondary | ICD-10-CM

## 2016-02-22 NOTE — Progress Notes (Signed)
81 y.o. G91P2002 Married  Caucasian Fe here for annual exam. Post menopausal no HRT. Denies vaginal bleeding or vaginal dryness. Recent visit with Urology with history of bladder cancer all normal. Recent right breast cancer in calcifications detected on Mammogram 3 D, lymph nodes negative surgery done in 4/17. Chose not to do radiation due to skin issues on Arimdex. Spouse recently with stroke and two family members passed in last month. Coping well, has good support with family, friends and church. Now living in retirement  [employer] which is working well. Sees Dr. Delfina Redwood for labs and Hypothyroid management. Sees Cardiology for labs/cholesterol management. Energy is good and eating well. Ambulates with problems and driving without issues.  No other health concerns today. Planning some family time.  No LMP recorded. Patient is postmenopausal.          Sexually active: No.  The current method of family planning is vasectomy.    Exercising: Yes.    balance class, chair yoga Smoker:  no  Health Maintenance: Pap: 10-24-10 neg MMG: 2017 Rt breast cancer grade 2 invasive ductal carcinoma Colonoscopy:  2008 BMD:   2011 TDaP:  2014 Shingles: no Pneumonia: had done Hep C and HIV: not done Labs: pcp Self breast exam: done occ   reports that she quit smoking about 31 years ago. Her smoking use included Cigarettes. She has never used smokeless tobacco. She reports that she drinks about 4.2 oz of alcohol per week. She reports that she does not use illicit drugs.  Past Medical History  Diagnosis Date  . External hemorrhoids without mention of complication   . Diverticulosis of colon (without mention of hemorrhage)   . Phlebitis   . Arthritis   . Cataract     left eye  . Hyperlipidemia   . Thyroid disease     hypothyroidism  . Anxiety   . Insomnia   . Vitamin D insufficiency     Dr. Joan Flores  . Osteopenia     mild  . Constipation     predominant IBS-miralax and citrucel daily-has used  linzess in the past. Dr Macky Lower)  . CAD (coronary artery disease) 2/10    S/P PCI (Stent) of LAD. Normal LVF-No angina  . MSSA (methicillin susceptible Staphylococcus aureus) 2011    buttock abscess  . Colonic stricture ([facility]) 2013    and colon adenoma  . Squamous cell carcinoma, leg 2012    right, left  . IBS (irritable bowel syndrome)   . Bladder cancer ([facility]) 2012  . Cancer of central portion of female breast, right 11/22/2015  . Breast cancer ([facility])   . PONV (postoperative nausea and vomiting)     Past Surgical History  Procedure Laterality Date  . Hemorrhoid surgery    . Total knee arthroplasty  06/2000    Left  . Left elbow surgery x4    . Tonsillectomy    . Bladder cancer surgery  2012    Dr Zannie Cove  . Knee surgery      Age [age]  . Coronary angioplasty with stent placement  10/2008  . Radioactive seed guided mastectomy with axillary sentinel lymph node biopsy Right 01/04/2016    Procedure: RADIOACTIVE SEED GUIDED PARTIAL MASTECTOMY WITH AXILLARY SENTINEL LYMPH NODE BIOPSY;  Surgeon: Autumn Messing III, MD;  Location: [facility];  Service: General;  Laterality: Right;    Current Outpatient Prescriptions  Medication Sig Dispense Refill  . acetaminophen (TYLENOL) 500 MG tablet Take 500 mg by mouth daily as needed for  mild pain or headache.    . anastrozole (ARIMIDEX) 1 MG tablet Take 1 tablet (1 mg total) by mouth daily. 90 tablet 3  . aspirin EC 81 MG tablet Take 81 mg by mouth daily.    . Coenzyme Q10 (COQ-10) 200 MG CAPS Take 1 capsule by mouth daily.    Marland Kitchen ezetimibe (ZETIA) 10 MG tablet Take 1 tablet (10 mg total) by mouth daily. 30 tablet 11  . levothyroxine (SYNTHROID, LEVOTHROID) 50 MCG tablet Take 50 mcg by mouth daily.    . Magnesium Hydroxide (MAGNESIA PO) Take 1 tablet by mouth daily.    . Multiple Vitamins-Minerals (CENTRUM CARDIO PO) Take 1 tablet by mouth daily.    . pravastatin (PRAVACHOL) 40 MG tablet Take 1 tablet (40 mg total) by mouth daily. 30 tablet 11  .  psyllium (HYDROCIL/METAMUCIL) 95 % PACK Take 1 packet by mouth daily.    Marland Kitchen zolpidem (AMBIEN) 10 MG tablet Take 5 mg by mouth At bedtime as needed for sleep. Reported on 01/19/2016    . ALPRAZolam (XANAX) 0.5 MG tablet Take 0.5 mg by mouth daily as needed for anxiety. Reported on 02/22/2016    . ibuprofen (ADVIL,MOTRIN) 200 MG tablet Take 200 mg by mouth daily as needed for moderate pain. Reported on 02/22/2016     No current facility-administered medications for this visit.    Family History  Problem Relation Age of Onset  . Heart disease Father 28    Died of MI  . Heart attack Father 81  . Breast cancer      aunt  . Hiatal hernia Sister   . Breast cancer Sister 66  . Ulcers Mother     peptic  . Alzheimer's disease Mother   . Hypertension Brother   . Lung cancer Brother   . Colon cancer Neg Hx   . Esophageal cancer Neg Hx   . Rectal cancer Neg Hx   . Stomach cancer Neg Hx   . Coronary artery disease Son 58    MI  . Stroke Son   . Coronary artery disease Paternal Uncle 24    Died with CAD  . Diabetes Maternal Grandmother   . Heart attack Maternal Grandfather     70's    ROS:  Pertinent items are noted in HPI.  Otherwise, a comprehensive ROS was negative.  Exam:   BP 118/78 mmHg  Pulse 80  Resp 20  Ht 5' 2.5" (1.588 m)  Wt 162 lb (73.483 kg)  BMI 29.14 kg/m2 Height: 5' 2.5" (158.8 cm) Ht Readings from Last 3 Encounters:  02/22/16 5' 2.5" (1.588 m)  01/19/16 5\' 3"  (1.6 m)  01/04/16 5\' 3"  (1.6 m)    General appearance: alert, cooperative and appears stated age Head: Normocephalic, without obvious abnormality, atraumatic Neck: no adenopathy, supple, symmetrical, trachea midline and thyroid normal to inspection and palpation Lungs: clear to auscultation bilaterally Breasts: normal appearance, no masses or tenderness, No nipple retraction or dimpling, No nipple discharge or bleeding, No axillary or supraclavicular adenopathy Heart: regular rate and rhythm Abdomen: soft,  non-tender; no masses,  no organomegaly Extremities: extremities normal, atraumatic, no cyanosis or edema Skin: Skin color, texture, turgor normal. No rashes or lesions Lymph nodes: Cervical, supraclavicular, and axillary nodes normal. No abnormal inguinal nodes palpated Neurologic: Grossly normal   Pelvic: External genitalia:  no lesions              Urethra:  normal appearing urethra with no masses, tenderness or lesions  Bartholin's and Skene's: normal                 Vagina: normal appearing vagina with normal color and discharge, no lesions              Cervix: no cervical motion tenderness, no lesions and normal appearance              Pap taken: Yes.   Bimanual Exam:  Uterus:  normal size, contour, position, consistency, mobility, non-tender              Adnexa: normal adnexa and no mass, fullness, tenderness               Rectovaginal: Confirms               Anus:  normal sphincter tone, no lesions  Chaperone present: yes  A:  Well Woman with normal exam  Menopausal no HRT  Recent Right breast cancer with negative nodes under oncology follow up History of skin cancer  Cholesterol/hypothyroid management with PCP  Social stress with cancer and deaths in family    P:   Reviewed health and wellness pertinent to exam  Aware of need to evaluate if vaginal bleeding  Continue follow up as indicated with MD  Seek support with family and friends  Pap smear as above   counseled on breast self exam, mammography screening, adequate intake of calcium and vitamin D, diet and exercise  return annually or prn  An After Visit Summary was printed and given to the patient.

## 2016-02-22 NOTE — Patient Instructions (Signed)

## 2016-02-23 DIAGNOSIS — C50911 Malignant neoplasm of unspecified site of right female breast: Secondary | ICD-10-CM | POA: Diagnosis not present

## 2016-02-23 LAB — IPS PAP SMEAR ONLY

## 2016-02-23 NOTE — Progress Notes (Signed)
Encounter reviewed by Dr. Brook Amundson C. Silva.  

## 2016-03-05 ENCOUNTER — Encounter: Payer: Self-pay | Admitting: Gastroenterology

## 2016-03-14 DIAGNOSIS — Z961 Presence of intraocular lens: Secondary | ICD-10-CM | POA: Diagnosis not present

## 2016-03-14 DIAGNOSIS — H04123 Dry eye syndrome of bilateral lacrimal glands: Secondary | ICD-10-CM | POA: Diagnosis not present

## 2016-03-14 DIAGNOSIS — H26492 Other secondary cataract, left eye: Secondary | ICD-10-CM | POA: Diagnosis not present

## 2016-03-14 DIAGNOSIS — H02834 Dermatochalasis of left upper eyelid: Secondary | ICD-10-CM | POA: Diagnosis not present

## 2016-03-14 DIAGNOSIS — H02831 Dermatochalasis of right upper eyelid: Secondary | ICD-10-CM | POA: Diagnosis not present

## 2016-03-15 ENCOUNTER — Ambulatory Visit (INDEPENDENT_AMBULATORY_CARE_PROVIDER_SITE_OTHER): Payer: Medicare HMO | Admitting: Gastroenterology

## 2016-03-15 ENCOUNTER — Encounter: Payer: Self-pay | Admitting: Gastroenterology

## 2016-03-15 ENCOUNTER — Other Ambulatory Visit (INDEPENDENT_AMBULATORY_CARE_PROVIDER_SITE_OTHER): Payer: Medicare HMO

## 2016-03-15 VITALS — BP 144/66 | HR 82 | Ht 62.5 in | Wt 162.8 lb

## 2016-03-15 DIAGNOSIS — R1032 Left lower quadrant pain: Secondary | ICD-10-CM | POA: Diagnosis not present

## 2016-03-15 DIAGNOSIS — R1031 Right lower quadrant pain: Secondary | ICD-10-CM

## 2016-03-15 DIAGNOSIS — C50911 Malignant neoplasm of unspecified site of right female breast: Secondary | ICD-10-CM

## 2016-03-15 LAB — CBC WITH DIFFERENTIAL/PLATELET
Basophils Absolute: 0.1 10*3/uL (ref 0.0–0.1)
Basophils Relative: 0.5 % (ref 0.0–3.0)
EOS PCT: 1.4 % (ref 0.0–5.0)
Eosinophils Absolute: 0.1 10*3/uL (ref 0.0–0.7)
HEMATOCRIT: 40 % (ref 36.0–46.0)
Hemoglobin: 13.4 g/dL (ref 12.0–15.0)
LYMPHS ABS: 1.8 10*3/uL (ref 0.7–4.0)
LYMPHS PCT: 18.7 % (ref 12.0–46.0)
MCHC: 33.5 g/dL (ref 30.0–36.0)
MCV: 93.6 fl (ref 78.0–100.0)
MONOS PCT: 5.2 % (ref 3.0–12.0)
Monocytes Absolute: 0.5 10*3/uL (ref 0.1–1.0)
NEUTROS ABS: 7.2 10*3/uL (ref 1.4–7.7)
NEUTROS PCT: 74.2 % (ref 43.0–77.0)
PLATELETS: 269 10*3/uL (ref 150.0–400.0)
RBC: 4.27 Mil/uL (ref 3.87–5.11)
RDW: 13 % (ref 11.5–15.5)
WBC: 9.7 10*3/uL (ref 4.0–10.5)

## 2016-03-15 LAB — BASIC METABOLIC PANEL
BUN: 26 mg/dL — AB (ref 6–23)
CALCIUM: 9.3 mg/dL (ref 8.4–10.5)
CHLORIDE: 109 meq/L (ref 96–112)
CO2: 21 meq/L (ref 19–32)
CREATININE: 0.85 mg/dL (ref 0.40–1.20)
GFR: 68.57 mL/min (ref >60.00)
Glucose, Bld: 103 mg/dL — ABNORMAL HIGH (ref 70–99)
Potassium: 4.4 mEq/L (ref 3.5–5.1)
Sodium: 139 mEq/L (ref 135–145)

## 2016-03-15 MED ORDER — HYOSCYAMINE SULFATE 0.125 MG SL SUBL: 0.1250 mg | SUBLINGUAL_TABLET | SUBLINGUAL | Status: DC | PRN

## 2016-03-15 NOTE — Progress Notes (Signed)
i agree with the above note, plan 

## 2016-03-15 NOTE — Progress Notes (Signed)
03/15/2016 [name] [id number] 08-11-1935   HISTORY OF PRESENT ILLNESS:  This is a 81 year old female who is known to Dr. Ardis Hughs for colonoscopy in April 2013 at which time she had one polyp removed as well as moderate diverticulosis in the left colon with a benign-appearing focal stenosis in the area of diverticulosis. Polyp was a tubular adenoma so she was put in for a 5 year colonoscopy recall. She presents to our office today with complaints of diffuse lower abdominal pain/cramping. She tells me that she was diagnosed with breast cancer and underwent surgery in April. She was then placed on a Arimidex about 3 weeks ago. Two weeks ago she started with moderate to severe lower abdominal pain/cramping. She says that they were quite intense at times and would wax and wane multiple times throughout the day. Would wake her up from sleep at night at times as well. She has not seen anybody else for these complaints. She says at this point that the pain has lessened/dissipated, but her entire abdomen is still sore.  She denies any other associated symptoms including nausea, vomiting, fever, chills. She does admit to some tendency towards constipation and takes Metamucil daily, but says that the constipation has been slightly worse recently.   Past Medical History  Diagnosis Date  . External hemorrhoids without mention of complication   . Diverticulosis of colon (without mention of hemorrhage)   . Phlebitis   . Arthritis   . Cataract     left eye  . Hyperlipidemia   . Thyroid disease     hypothyroidism  . Anxiety   . Insomnia   . Vitamin D insufficiency     Dr. Joan Flores  . Osteopenia     mild  . Constipation     predominant IBS-miralax and citrucel daily-has used linzess in the past. Dr Macky Lower)  . CAD (coronary artery disease) 2/10    S/P PCI (Stent) of LAD. Normal LVF-No angina  . MSSA (methicillin susceptible Staphylococcus aureus) 2011    buttock abscess  . Colonic stricture  ([place]) 2013    and colon adenoma  . Squamous cell carcinoma, leg 2012    right, left  . IBS (irritable bowel syndrome)   . Bladder cancer ([place]) 2012  . Cancer of central portion of female breast, right 11/22/2015  . Breast cancer ([place])   . PONV (postoperative nausea and vomiting)    Past Surgical History  Procedure Laterality Date  . Hemorrhoid surgery    . Total knee arthroplasty  06/2000    Left  . Left elbow surgery x4    . Tonsillectomy    . Bladder cancer surgery  2012    Dr Zannie Cove  . Knee surgery      Age [age]  . Coronary angioplasty with stent placement  10/2008  . Radioactive seed guided mastectomy with axillary sentinel lymph node biopsy Right 01/04/2016    Procedure: RADIOACTIVE SEED GUIDED PARTIAL MASTECTOMY WITH AXILLARY SENTINEL LYMPH NODE BIOPSY;  Surgeon: Autumn Messing III, MD;  Location: [facility];  Service: General;  Laterality: Right;    reports that she quit smoking about 31 years ago. Her smoking use included Cigarettes. She has never used smokeless tobacco. She reports that she drinks about 4.2 oz of alcohol per week. She reports that she does not use illicit drugs. family history includes Alzheimer's disease in her mother; Breast cancer (age of onset: 56) in her sister; Coronary artery disease (age of onset: 103) in her  son; Coronary artery disease (age of onset: 90) in her paternal uncle; Diabetes in her maternal grandmother; Heart attack in her maternal grandfather; Heart attack (age of onset: 55) in her father; Heart disease (age of onset: 23) in her father; Hiatal hernia in her sister; Hypertension in her brother; Lung cancer in her brother; Stroke in her son; Ulcers in her mother. There is no history of Colon cancer, Esophageal cancer, Rectal cancer, or Stomach cancer. Allergies  Allergen Reactions  . Benadryl [Diphenhydramine Hcl] Other (See Comments)    hyperactivity  . Crestor [Rosuvastatin] Other (See Comments)    Muscle Aches  . Lipitor [Atorvastatin] Other  (See Comments)    Muscle aches  . Morphine And Related Nausea And Vomiting  . Tramadol Nausea And Vomiting  . Adhesive [Tape] Other (See Comments)    Thin and sensitive skin, please use "paper" tape  . Cortisone Rash  . Sulfonamide Derivatives Hives      Outpatient Encounter Prescriptions as of 03/15/2016  Medication Sig  . acetaminophen (TYLENOL) 500 MG tablet Take 500 mg by mouth daily as needed for mild pain or headache.  . ALPRAZolam (XANAX) 0.5 MG tablet Take 0.5 mg by mouth daily as needed for anxiety. Reported on 02/22/2016  . anastrozole (ARIMIDEX) 1 MG tablet Take 1 tablet (1 mg total) by mouth daily.  Marland Kitchen aspirin EC 81 MG tablet Take 81 mg by mouth daily.  . Coenzyme Q10 (COQ-10) 200 MG CAPS Take 1 capsule by mouth daily.  Marland Kitchen ezetimibe (ZETIA) 10 MG tablet Take 1 tablet (10 mg total) by mouth daily.  Marland Kitchen ibuprofen (ADVIL,MOTRIN) 200 MG tablet Take 200 mg by mouth daily as needed for moderate pain. Reported on 02/22/2016  . levothyroxine (SYNTHROID, LEVOTHROID) 50 MCG tablet Take 50 mcg by mouth daily.  . Magnesium Hydroxide (MAGNESIA PO) Take 1 tablet by mouth daily.  . Multiple Vitamins-Minerals (CENTRUM CARDIO PO) Take 1 tablet by mouth daily.  . pravastatin (PRAVACHOL) 40 MG tablet Take 1 tablet (40 mg total) by mouth daily.  . psyllium (HYDROCIL/METAMUCIL) 95 % PACK Take 1 packet by mouth daily.  Marland Kitchen zolpidem (AMBIEN) 10 MG tablet Take 5 mg by mouth At bedtime as needed for sleep. Reported on 01/19/2016  . hyoscyamine (LEVSIN/SL) 0.125 MG SL tablet Place 1 tablet (0.125 mg total) under the tongue every 4 (four) hours as needed.   No facility-administered encounter medications on file as of 03/15/2016.     REVIEW OF SYSTEMS  : All other systems reviewed and negative except where noted in the History of Present Illness.   PHYSICAL EXAM: BP 144/66 mmHg  Pulse 82  Ht 5' 2.5" (1.588 m)  Wt 162 lb 12.8 oz (73.846 kg)  BMI 29.28 kg/m2 General: Well developed white female in no acute  distress Head: Normocephalic and atraumatic Eyes:  Sclerae anicteric, conjunctiva pink. Ears: Normal auditory acuity Lungs: Clear throughout to auscultation Heart: Regular rate and rhythm Abdomen: Soft, non-distended.  Normal bowel sounds.  Diffuse lower abdominal TTP. Musculoskeletal: Symmetrical with no gross deformities  Skin: No lesions on visible extremities Extremities: No edema  Neurological: Alert oriented x 4, grossly non-focal Psychological:  Alert and cooperative. Normal mood and affect  ASSESSMENT AND PLAN: -81 year old female with stage I right-sided breast cancer status post surgery in April and now on Arimidex.  Here for complaints of lower abdominal pain/cramping, moderate to severe in intensity, for the past 2 weeks. No other associated symptoms. Pain has dissipated somewhat but is still present.  I  wonder if this could be a side effect of the Arimidex. I am going to order a CT scan abdomen and pelvis with contrast to rule out diverticulitis, etc. If that CT scan is negative then I have asked her to speak with her oncologist regarding that medication.  Check CBC and BMP.  I'm going to give her some Levsin to try when necessary in the interim.  I discussed with her regarding initiation of MiraLAX in addition to her daily Metamucil in light of her worsening constipation.  CC:  Seward Carol, MD

## 2016-03-15 NOTE — Patient Instructions (Addendum)
Please go to the basement level to have your labs drawn.  We sent a prescription for Levsin SL tablets for lower abdominal pain.    You have been scheduled for a CT scan of the abdomen and pelvis at [facility] ([address]---this is in the same building as [facility]).   You are scheduled on 03-19-2016 at 3:00 PM. You should arrive at 2:45 PM to your appointment time for registration. Please follow the written instructions below on the day of your exam:  WARNING: IF YOU ARE ALLERGIC TO IODINE/X-RAY DYE, PLEASE NOTIFY RADIOLOGY IMMEDIATELY AT [phone]! YOU WILL BE GIVEN A 13 HOUR PREMEDICATION PREP.  1) Do not eat or drink anything after 11:00 am (4 hours prior to your test) 2) You have been given 2 bottles of oral contrast to drink. The solution may taste better if refrigerated, but do NOT add ice or any other liquid to this solution. Shake  well before drinking.    Drink 1 bottle of contrast @ 1:00 PM (2 hours prior to your exam)  Drink 1 bottle of contrast @ 2:00 PM (1 hour prior to your exam)  You may take any medications as prescribed with a small amount of water except for the following: Metformin, Glucophage, Glucovance, Avandamet, Riomet, Fortamet, Actoplus Met, Janumet, Glumetza or Metaglip. The above medications must be held the day of the exam AND 48 hours after the exam.  The purpose of you drinking the oral contrast is to aid in the visualization of your intestinal tract. The contrast solution may cause some diarrhea. Before your exam is started, you will be given a small amount of fluid to drink. Depending on your individual set of symptoms, you may also receive an intravenous injection of x-ray contrast/dye. Plan on being at [facility] for 30 minutes or long, depending on the type of exam you are having performed.  If you have any questions regarding your exam or if you need to reschedule, you may call the CT department at [phone] between  the hours of 8:00 am and 5:00 pm, Monday-Friday.  ________________________________________________________________________  If you are age 79 or older, your body mass index should be between 23-30. Your Body mass index is 29.28 kg/(m^2). If this is out of the aforementioned range listed, please consider follow up with your Primary Care Provider.

## 2016-03-16 ENCOUNTER — Telehealth: Payer: Self-pay | Admitting: Hematology and Oncology

## 2016-03-16 NOTE — Telephone Encounter (Signed)
Pt called to cx...the patient does not want to r/s

## 2016-03-19 ENCOUNTER — Ambulatory Visit (INDEPENDENT_AMBULATORY_CARE_PROVIDER_SITE_OTHER)
Admission: RE | Admit: 2016-03-19 | Discharge: 2016-03-19 | Disposition: A | Discharge status: Home / Self Care | Payer: Medicare HMO | Source: Ambulatory Visit | Attending: Gastroenterology | Admitting: Gastroenterology

## 2016-03-19 DIAGNOSIS — R1030 Lower abdominal pain, unspecified: Secondary | ICD-10-CM | POA: Diagnosis not present

## 2016-03-19 DIAGNOSIS — N281 Cyst of kidney, acquired: Secondary | ICD-10-CM | POA: Diagnosis not present

## 2016-03-19 DIAGNOSIS — R1032 Left lower quadrant pain: Secondary | ICD-10-CM | POA: Diagnosis not present

## 2016-03-19 DIAGNOSIS — C50911 Malignant neoplasm of unspecified site of right female breast: Secondary | ICD-10-CM

## 2016-03-19 DIAGNOSIS — R1031 Right lower quadrant pain: Secondary | ICD-10-CM | POA: Diagnosis not present

## 2016-03-19 MED ORDER — IOPAMIDOL (ISOVUE-300) INJECTION 61%
100.0000 mL | Freq: Once | INTRAVENOUS | Status: AC | PRN
Start: 2016-03-19 — End: 2016-03-19
  Administered 2016-03-19: 100 mL via INTRAVENOUS

## 2016-03-23 ENCOUNTER — Telehealth: Payer: Self-pay

## 2016-03-23 NOTE — Telephone Encounter (Signed)
Received VM from pt stating she was concerned about redness to bilateral eyes.  Called pt back to obtain further details.  Pt reports redness around both eyes as well as eye itself as well as a feeling of "shield" over eyes.  Pt denies any itching, pain, discharge, pressure, signs of infection, or bumps.  Pt states she was evaluated by RN at [facility] ([facility]  And RN informed pt to contact us regarding eye redness.  I discussed information with Dr. Lindi Adie who does not feel an association can be made with anastrozole.  Dr. Lindi Adie recommends pt to be evaluated by PCP and states pt can take 2 week break from anastrozole if she wishes to see if eye symptoms subside.  Called pt back and notified her of this information.  Pt states she does not wish to hold medication and reports she will make an appointment with her PCP for evaluation.  Pt verbalized understanding to notify us should she have any additional symptoms or questions or comments.

## 2016-03-29 DIAGNOSIS — H578 Other specified disorders of eye and adnexa: Secondary | ICD-10-CM | POA: Diagnosis not present

## 2016-03-29 DIAGNOSIS — T7840XA Allergy, unspecified, initial encounter: Secondary | ICD-10-CM | POA: Diagnosis not present

## 2016-03-30 DIAGNOSIS — H10413 Chronic giant papillary conjunctivitis, bilateral: Secondary | ICD-10-CM | POA: Diagnosis not present

## 2016-03-30 DIAGNOSIS — H02834 Dermatochalasis of left upper eyelid: Secondary | ICD-10-CM | POA: Diagnosis not present

## 2016-03-30 DIAGNOSIS — H02831 Dermatochalasis of right upper eyelid: Secondary | ICD-10-CM | POA: Diagnosis not present

## 2016-03-30 DIAGNOSIS — H26492 Other secondary cataract, left eye: Secondary | ICD-10-CM | POA: Diagnosis not present

## 2016-03-30 DIAGNOSIS — Z961 Presence of intraocular lens: Secondary | ICD-10-CM | POA: Diagnosis not present

## 2016-03-30 DIAGNOSIS — H04123 Dry eye syndrome of bilateral lacrimal glands: Secondary | ICD-10-CM | POA: Diagnosis not present

## 2016-04-04 DIAGNOSIS — R69 Illness, unspecified: Secondary | ICD-10-CM | POA: Diagnosis not present

## 2016-04-13 ENCOUNTER — Encounter: Payer: Medicare HMO | Admitting: Adult Health

## 2016-04-17 DIAGNOSIS — E039 Hypothyroidism, unspecified: Secondary | ICD-10-CM | POA: Diagnosis not present

## 2016-04-17 DIAGNOSIS — R69 Illness, unspecified: Secondary | ICD-10-CM | POA: Diagnosis not present

## 2016-04-17 DIAGNOSIS — E7800 Pure hypercholesterolemia, unspecified: Secondary | ICD-10-CM | POA: Diagnosis not present

## 2016-04-17 DIAGNOSIS — I25119 Atherosclerotic heart disease of native coronary artery with unspecified angina pectoris: Secondary | ICD-10-CM | POA: Diagnosis not present

## 2016-04-17 DIAGNOSIS — M8588 Other specified disorders of bone density and structure, other site: Secondary | ICD-10-CM | POA: Diagnosis not present

## 2016-04-17 DIAGNOSIS — Z1389 Encounter for screening for other disorder: Secondary | ICD-10-CM | POA: Diagnosis not present

## 2016-04-17 DIAGNOSIS — Z0000 Encounter for general adult medical examination without abnormal findings: Secondary | ICD-10-CM | POA: Diagnosis not present

## 2016-04-17 DIAGNOSIS — G4700 Insomnia, unspecified: Secondary | ICD-10-CM | POA: Diagnosis not present

## 2016-04-17 DIAGNOSIS — C679 Malignant neoplasm of bladder, unspecified: Secondary | ICD-10-CM | POA: Diagnosis not present

## 2016-05-04 DIAGNOSIS — L570 Actinic keratosis: Secondary | ICD-10-CM | POA: Diagnosis not present

## 2016-05-04 DIAGNOSIS — D2261 Melanocytic nevi of right upper limb, including shoulder: Secondary | ICD-10-CM | POA: Diagnosis not present

## 2016-05-04 DIAGNOSIS — L821 Other seborrheic keratosis: Secondary | ICD-10-CM | POA: Diagnosis not present

## 2016-05-04 DIAGNOSIS — D225 Melanocytic nevi of trunk: Secondary | ICD-10-CM | POA: Diagnosis not present

## 2016-05-04 DIAGNOSIS — L814 Other melanin hyperpigmentation: Secondary | ICD-10-CM | POA: Diagnosis not present

## 2016-05-04 DIAGNOSIS — L565 Disseminated superficial actinic porokeratosis (DSAP): Secondary | ICD-10-CM | POA: Diagnosis not present

## 2016-05-04 DIAGNOSIS — L84 Corns and callosities: Secondary | ICD-10-CM | POA: Diagnosis not present

## 2016-05-04 DIAGNOSIS — D692 Other nonthrombocytopenic purpura: Secondary | ICD-10-CM | POA: Diagnosis not present

## 2016-05-04 DIAGNOSIS — L723 Sebaceous cyst: Secondary | ICD-10-CM | POA: Diagnosis not present

## 2016-05-04 DIAGNOSIS — Z85828 Personal history of other malignant neoplasm of skin: Secondary | ICD-10-CM | POA: Diagnosis not present

## 2016-05-17 DIAGNOSIS — M8588 Other specified disorders of bone density and structure, other site: Secondary | ICD-10-CM | POA: Diagnosis not present

## 2016-05-18 ENCOUNTER — Telehealth: Payer: Self-pay | Admitting: Hematology and Oncology

## 2016-05-18 NOTE — Telephone Encounter (Signed)
Lvm to inform pt of r/s appt per [facility] due to  upcoming weather condition

## 2016-05-21 ENCOUNTER — Ambulatory Visit: Payer: Medicare HMO | Admitting: Hematology and Oncology

## 2016-05-24 ENCOUNTER — Telehealth: Payer: Self-pay | Admitting: Hematology and Oncology

## 2016-05-24 ENCOUNTER — Encounter: Payer: Self-pay | Admitting: Hematology and Oncology

## 2016-05-24 ENCOUNTER — Ambulatory Visit (HOSPITAL_BASED_OUTPATIENT_CLINIC_OR_DEPARTMENT_OTHER): Payer: Medicare HMO | Admitting: Hematology and Oncology

## 2016-05-24 DIAGNOSIS — R42 Dizziness and giddiness: Secondary | ICD-10-CM | POA: Diagnosis not present

## 2016-05-24 DIAGNOSIS — C50111 Malignant neoplasm of central portion of right female breast: Secondary | ICD-10-CM | POA: Diagnosis not present

## 2016-05-24 DIAGNOSIS — N951 Menopausal and female climacteric states: Secondary | ICD-10-CM | POA: Diagnosis not present

## 2016-05-24 MED ORDER — LETROZOLE 2.5 MG PO TABS: 2.5000 mg | ORAL_TABLET | Freq: Every day | ORAL | 3 refills | Status: DC

## 2016-05-24 NOTE — Telephone Encounter (Signed)
appt made; letter sent by mail

## 2016-05-24 NOTE — Assessment & Plan Note (Signed)
Right lumpectomy 01/04/2016: Microscopic focus of invasive ductal carcinoma less than 0.1 cm with high-grade DCIS, comedo necrosis, less than 0.1 cm the superior margin, 0/9 LN negative, grade 2, ER 100%, PR 5%, HER-2 negative ratio 1.72, Ki-67 20%, T1 N0 stage IA  Patient did not get adjuvant radiation therapy because of minimal microscopic invasive cancer as well as very sensitive skin  Current treatment: Anastrozole 1 mg daily started June 2017, being change to letrozole 2.5 mg daily 05/24/2016 due to dizziness and hot flashes  Anastrozole toxicities: 1. Dizziness 2. hot flashes that are quite severe  After discussing different treatment options we decided to try letrozole. Return to clinic in 3 months to assess tolerability to letrozole.

## 2016-05-24 NOTE — Progress Notes (Signed)
Patient Care Team: Seward Carol, MD as PCP - General (Internal Medicine) Sylvan Cheese, NP as Nurse Practitioner (Hematology and Oncology)  DIAGNOSIS: Cancer of central portion of female breast, right   Staging form: Breast, AJCC 7th Edition   - Clinical stage from 11/23/2015: Stage IIA (T2, N0, M0) - Unsigned         Staging comments: Staged at breast conference on 3.15.17  SUMMARY OF ONCOLOGIC HISTORY:   Cancer of central portion of female breast, right   11/08/2015 Mammogram    Right breast 3.2 cm group of pleomorphic calcifications, breast density B, T2 N0 stage II a clinical stage (unclear how much of the calcification is invasive ductal carcinoma versus DCIS)      11/14/2015 Initial Diagnosis    Right breast core biopsy: Small focus of Invasive ductal carcinoma with high-grade DCIS with comedonecrosis, ER 100%, PR 5%, Ki-67 20%, HER-2 negative      01/04/2016 Surgery    Right lumpectomy: Microscopic focus of invasive ductal carcinoma less than 0.1 cm with high-grade DCIS, comedo necrosis, less than 0.1 cm the superior margin, 0/9 LN negative, grade 2, ER 100%, PR 5%, HER-2 negative ratio 1.72, Ki-67 20%, T1 N0 stage IA       02/22/2016 -  Anti-estrogen oral therapy    Anastrozole 1 mg daily switched to letrozole 2.5 mg daily 05/24/2016 due to dizziness and severe hot flashes       CHIEF COMPLIANT: Follow-up on anastrozole therapy  INTERVAL HISTORY: [name] is a 81 year old with above-mentioned history of right breast cancer treated with lumpectomy and is currently on adjuvant antiestrogen therapy with anastrozole. She has been taking it for the past 3 months. She reports that she does have intermittent dizziness and lightheadedness. She also complains of worsening hot flashes and night sweats. Denies any pain or discomfort in the breast. She did not undergo radiation therapy because of sensitive skin.  REVIEW OF SYSTEMS:   Constitutional: Denies fevers, chills  or abnormal weight loss Eyes: Denies blurriness of vision Ears, nose, mouth, throat, and face: Denies mucositis or sore throat Respiratory: Denies cough, dyspnea or wheezes Cardiovascular: Denies palpitation, chest discomfort Gastrointestinal:  Denies nausea, heartburn or change in bowel habits Skin: Denies abnormal skin rashes Lymphatics: Denies new lymphadenopathy or easy bruising Neurological:Denies numbness, tingling or new weaknesses Behavioral/Psych: Mood is stable, no new changes  Extremities: No lower extremity edema Breast:  denies any pain or lumps or nodules in either breasts All other systems were reviewed with the patient and are negative.  I have reviewed the past medical history, past surgical history, social history and family history with the patient and they are unchanged from previous note.  ALLERGIES:  is allergic to benadryl [diphenhydramine hcl]; crestor [rosuvastatin]; lipitor [atorvastatin]; morphine and related; tramadol; adhesive [tape]; cortisone; and sulfonamide derivatives.  MEDICATIONS:  Current Outpatient Prescriptions  Medication Sig Dispense Refill  . acetaminophen (TYLENOL) 500 MG tablet Take 500 mg by mouth daily as needed for mild pain or headache.    . ALPRAZolam (XANAX) 0.5 MG tablet Take 0.5 mg by mouth daily as needed for anxiety. Reported on 02/22/2016    . aspirin EC 81 MG tablet Take 81 mg by mouth daily.    . Coenzyme Q10 (COQ-10) 200 MG CAPS Take 1 capsule by mouth daily.    Marland Kitchen ezetimibe (ZETIA) 10 MG tablet Take 1 tablet (10 mg total) by mouth daily. 30 tablet 11  . hyoscyamine (LEVSIN/SL) 0.125 MG SL tablet Place  1 tablet (0.125 mg total) under the tongue every 4 (four) hours as needed. 30 tablet 2  . ibuprofen (ADVIL,MOTRIN) 200 MG tablet Take 200 mg by mouth daily as needed for moderate pain. Reported on 02/22/2016    . letrozole (FEMARA) 2.5 MG tablet Take 1 tablet (2.5 mg total) by mouth daily. 90 tablet 3  . levothyroxine (SYNTHROID,  LEVOTHROID) 50 MCG tablet Take 50 mcg by mouth daily.    . Magnesium Hydroxide (MAGNESIA PO) Take 1 tablet by mouth daily.    . Multiple Vitamins-Minerals (CENTRUM CARDIO PO) Take 1 tablet by mouth daily.    . pravastatin (PRAVACHOL) 40 MG tablet Take 1 tablet (40 mg total) by mouth daily. 30 tablet 11  . psyllium (HYDROCIL/METAMUCIL) 95 % PACK Take 1 packet by mouth daily.    Marland Kitchen zolpidem (AMBIEN) 10 MG tablet Take 5 mg by mouth At bedtime as needed for sleep. Reported on 01/19/2016     No current facility-administered medications for this visit.     PHYSICAL EXAMINATION: ECOG PERFORMANCE STATUS: 1 - Symptomatic but completely ambulatory  Vitals:   05/24/16 1433  BP: 140/76  Pulse: 84  Resp: 18  Temp: 98.1 F (36.7 C)   Filed Weights   05/24/16 1433  Weight: 163 lb 11.2 oz (74.3 kg)    GENERAL:alert, no distress and comfortable SKIN: skin color, texture, turgor are normal, no rashes or significant lesions EYES: normal, Conjunctiva are pink and non-injected, sclera clear OROPHARYNX:no exudate, no erythema and lips, buccal mucosa, and tongue normal  NECK: supple, thyroid normal size, non-tender, without nodularity LYMPH:  no palpable lymphadenopathy in the cervical, axillary or inguinal LUNGS: clear to auscultation and percussion with normal breathing effort HEART: regular rate & rhythm and no murmurs and no lower extremity edema ABDOMEN:abdomen soft, non-tender and normal bowel sounds MUSCULOSKELETAL:no cyanosis of digits and no clubbing  NEURO: alert & oriented x 3 with fluent speech, no focal motor/sensory deficits EXTREMITIES: No lower extremity edema   LABORATORY DATA:  I have reviewed the data as listed   Chemistry      Component Value Date/Time   NA 139 03/15/2016 1027   NA 139 11/23/2015 0810   K 4.4 03/15/2016 1027   K 4.2 11/23/2015 0810   CL 109 03/15/2016 1027   CO2 21 03/15/2016 1027   CO2 25 11/23/2015 0810   BUN 26 (H) 03/15/2016 1027   BUN 21.8  11/23/2015 0810   CREATININE 0.85 03/15/2016 1027   CREATININE 0.9 11/23/2015 0810      Component Value Date/Time   CALCIUM 9.3 03/15/2016 1027   CALCIUM 9.3 11/23/2015 0810   ALKPHOS 72 01/25/2016 0826   ALKPHOS 84 11/23/2015 0810   AST 18 01/25/2016 0826   AST 21 11/23/2015 0810   ALT 16 01/25/2016 0826   ALT 25 11/23/2015 0810   BILITOT 0.6 01/25/2016 0826   BILITOT 0.48 11/23/2015 0810       Lab Results  Component Value Date   WBC 9.7 03/15/2016   HGB 13.4 03/15/2016   HCT 40.0 03/15/2016   MCV 93.6 03/15/2016   PLT 269.0 03/15/2016   NEUTROABS 7.2 03/15/2016     ASSESSMENT & PLAN:  Cancer of central portion of female breast, right Right lumpectomy 01/04/2016: Microscopic focus of invasive ductal carcinoma less than 0.1 cm with high-grade DCIS, comedo necrosis, less than 0.1 cm the superior margin, 0/9 LN negative, grade 2, ER 100%, PR 5%, HER-2 negative ratio 1.72, Ki-67 20%, T1 N0 stage IA  Patient did not get adjuvant radiation therapy because of minimal microscopic invasive cancer as well as very sensitive skin  Current treatment: Anastrozole 1 mg daily started June 2017, being change to letrozole 2.5 mg daily 05/24/2016 due to dizziness and hot flashes  Anastrozole toxicities: 1. Dizziness 2. hot flashes that are quite severe  After discussing different treatment options we decided to try letrozole. Return to clinic in 3 months to assess tolerability to letrozole.     No orders of the defined types were placed in this encounter.  The patient has a good understanding of the overall plan. she agrees with it. she will call with any problems that may develop before the next visit here.   Rulon Eisenmenger, MD 05/24/16

## 2016-06-01 DIAGNOSIS — D692 Other nonthrombocytopenic purpura: Secondary | ICD-10-CM | POA: Diagnosis not present

## 2016-06-01 DIAGNOSIS — Z85828 Personal history of other malignant neoplasm of skin: Secondary | ICD-10-CM | POA: Diagnosis not present

## 2016-06-01 DIAGNOSIS — D485 Neoplasm of uncertain behavior of skin: Secondary | ICD-10-CM | POA: Diagnosis not present

## 2016-06-01 DIAGNOSIS — C44329 Squamous cell carcinoma of skin of other parts of face: Secondary | ICD-10-CM | POA: Diagnosis not present

## 2016-06-01 DIAGNOSIS — L570 Actinic keratosis: Secondary | ICD-10-CM | POA: Diagnosis not present

## 2016-07-04 DIAGNOSIS — C44329 Squamous cell carcinoma of skin of other parts of face: Secondary | ICD-10-CM | POA: Diagnosis not present

## 2016-07-04 DIAGNOSIS — Z85828 Personal history of other malignant neoplasm of skin: Secondary | ICD-10-CM | POA: Diagnosis not present

## 2016-07-05 ENCOUNTER — Ambulatory Visit (HOSPITAL_COMMUNITY)
Admission: RE | Admit: 2016-07-05 | Discharge: 2016-07-05 | Disposition: A | Discharge status: Home / Self Care | Payer: Medicare HMO | Source: Ambulatory Visit | Attending: Hematology and Oncology | Admitting: Hematology and Oncology

## 2016-07-05 ENCOUNTER — Encounter (HOSPITAL_COMMUNITY): Payer: Medicare HMO

## 2016-07-05 ENCOUNTER — Other Ambulatory Visit: Payer: Self-pay

## 2016-07-05 DIAGNOSIS — M7989 Other specified soft tissue disorders: Secondary | ICD-10-CM

## 2016-07-05 DIAGNOSIS — C50311 Malignant neoplasm of lower-inner quadrant of right female breast: Secondary | ICD-10-CM

## 2016-07-05 NOTE — Progress Notes (Signed)
Received call from pt stating she was concerned with some right forearm and wrist swelling that has been going on for the last couple of days.  Pt denies any redness, pain, or heat to the area.  Pt has history of right breast ca and thus Dr. Lindi Adie wishes to get an upper extremity doppler to r/o DVT.  If this testing is negative we will refer pt to lymphedema clinic for evaluation.  All information discussed with pt who is aware of 2pm appt today for vascular US of right arm.  Pt without further questions or concerns at time of call.

## 2016-07-05 NOTE — Progress Notes (Signed)
Attempted to reach pt to follow up regarding vascular US results from earlier this afternoon.  Results were negative for DVT.  Reviewed with Dr. Lindi Adie who wishes for pt to have referral placed with lymphedema clinic.  Referral entered as requested.  Unable to reach pt to let her know she should expect a phone call from lymphedema clinic at this point for evaluation.  I will attempt to reach pt again to inform her of this.

## 2016-07-05 NOTE — Progress Notes (Signed)
*  Preliminary Results* Right upper extremity venous duplex completed. Right upper extremity is negative for deep and superficial vein thrombosis.  07/05/2016 2:43 PM  Maudry Mayhew, BS, RVT, RDCS, RDMS

## 2016-07-12 ENCOUNTER — Ambulatory Visit: Payer: Medicare HMO | Attending: Hematology and Oncology | Admitting: Physical Therapy

## 2016-07-12 ENCOUNTER — Encounter: Payer: Self-pay | Admitting: Physical Therapy

## 2016-07-12 DIAGNOSIS — I890 Lymphedema, not elsewhere classified: Secondary | ICD-10-CM | POA: Insufficient documentation

## 2016-07-12 NOTE — Therapy (Signed)
[place], [place], [address] Phone: [phone]   Fax:  [phone]  Physical Therapy Evaluation  Patient Details  Name: [name] MRN: [id number] Date of Birth: Apr 30, 1935 Referring Provider: Lindi Adie  Encounter Date: 07/12/2016      PT End of Session - 07/12/16 1720    Visit Number 1   Number of Visits 5   Date for PT Re-Evaluation 08/09/16   PT Start Time 3845  pt arrived late   PT Stop Time 1345   PT Time Calculation (min) 33 min   Activity Tolerance Patient tolerated treatment well   Behavior During Therapy [facility] for tasks assessed/performed      Past Medical History:  Diagnosis Date  . Anxiety   . Arthritis   . Bladder cancer ([place]) 2012  . Breast cancer ([place])   . CAD (coronary artery disease) 2/10   S/P PCI (Stent) of LAD. Normal LVF-No angina  . Cancer of central portion of female breast, right 11/22/2015  . Cataract    left eye  . Colonic stricture 2013   and colon adenoma  . Constipation    predominant IBS-miralax and citrucel daily-has used linzess in the past. Dr Macky Lower)  . Diverticulosis of colon (without mention of hemorrhage)   . External hemorrhoids without mention of complication   . Hyperlipidemia   . IBS (irritable bowel syndrome)   . Insomnia   . MSSA (methicillin susceptible Staphylococcus aureus) 2011   buttock abscess  . Osteopenia    mild  . Phlebitis   . PONV (postoperative nausea and vomiting)   . Squamous cell carcinoma, leg 2012   right, left  . Thyroid disease    hypothyroidism  . Vitamin D insufficiency    Dr. Joan Flores    Past Surgical History:  Procedure Laterality Date  . Bladder cancer surgery  2012   Dr Zannie Cove  . CORONARY ANGIOPLASTY WITH STENT PLACEMENT  10/2008  . HEMORRHOID SURGERY    . KNEE SURGERY     Age 41-30  . Left elbow surgery x4    . RADIOACTIVE SEED GUIDED MASTECTOMY WITH AXILLARY SENTINEL LYMPH NODE BIOPSY Right  01/04/2016   Procedure: RADIOACTIVE SEED GUIDED PARTIAL MASTECTOMY WITH AXILLARY SENTINEL LYMPH NODE BIOPSY;  Surgeon: Autumn Messing III, MD;  Location: [place];  Service: General;  Laterality: Right;  . TONSILLECTOMY    . TOTAL KNEE ARTHROPLASTY  06/2000   Left    There were no vitals filed for this visit.       Subjective Assessment - 07/12/16 1319    Subjective My arm started swelling about three weeks ago. I just noticed it was a little more pronouced in the back of my arm and forearm.    Pertinent History Patient was diagnosed on 11/14/15 with right DCIS breast cancer with a small area of invasive breast cancer.  It measures 3.2 cm in the lower outer quadrant, is ER/PR positive, HER2 negative and has a Ki67 of 20%.  Pt underwent right lumpectomy and lymph node dissection (9)   Patient Stated Goals to be able to know what to do to manage edema, reduce the swelling   Currently in Pain? No/denies   Pain Score 0-No pain            [facility] PT Assessment - 07/12/16 0001      Assessment   Medical Diagnosis Right breast cancer   Referring Provider Gudena   Onset Date/Surgical Date 01/04/16   Hand  Dominance Left   Prior Therapy none     Precautions   Precautions Other (comment)  cancer, lymphedema   Precaution Comments left elbow surgeries     Restrictions   Weight Bearing Restrictions No     Balance Screen   Has the patient fallen in the past 6 months No   Has the patient had a decrease in activity level because of a fear of falling?  No   Is the patient reluctant to leave their home because of a fear of falling?  No     Home Environment   Living Environment Private residence   Living Arrangements Spouse/significant other   Available Help at Discharge Family   Type of [place] Access Level entry   [place] level   [place] bars - toilet;[place] bars - tub/shower     Prior Function   Level of Independence Independent   Vocation Retired   Office manager N/A   Leisure pt reports she does chair yoga, a balance class and rides the stationary bike     Cognition   Overall Cognitive Status Within Functional Limits for tasks assessed     Observation/Other Assessments   Other Surveys  --  LLIS: 12% impaired     Posture/Postural Control   Posture/Postural Control Postural limitations   Postural Limitations Forward head;Rounded Shoulders  Left elbow flexion contracture     AROM   Overall AROM  Within functional limits for tasks performed  pt has decreased L elbow range of motion from prior surgerie   Right Shoulder Extension --   Right Shoulder Flexion --   Right Shoulder ABduction --   Right Shoulder Internal Rotation --   Right Shoulder External Rotation --   Left Shoulder Extension --   Left Shoulder Flexion --   Left Shoulder ABduction --   Left Shoulder Internal Rotation --   Left Shoulder External Rotation --     Strength   Overall Strength Within functional limits for tasks performed   Overall Strength Comments Right UE is WFL; left UE not tested due to multiple elbow surgeries           LYMPHEDEMA/ONCOLOGY QUESTIONNAIRE - 07/12/16 1326      Type   Cancer Type Right breast cancer     Surgeries   Lumpectomy Date 01/04/16   Axillary Lymph Node Dissection Date 01/04/16   Number Lymph Nodes Removed 9     Date Lymphedema/Swelling Started   Date 06/21/16     Treatment   Active Chemotherapy Treatment No   Past Chemotherapy Treatment No   Active Radiation Treatment No   Past Radiation Treatment No   Current Hormone Treatment No   Past Hormone Therapy No     What other symptoms do you have   Are you Having Heaviness or Tightness No   Are you having Pain No   Are you having pitting edema No   Is it Hard or Difficult finding clothes that fit Yes   Do you have infections No   Is there Decreased scar mobility No     Lymphedema Assessments   Lymphedema Assessments Upper extremities     Right Upper  Extremity Lymphedema   15 cm Proximal to Olecranon Process 31 cm   10 cm Proximal to Olecranon Process 30.9 cm   Olecranon Process 25.5 cm   15 cm Proximal to Ulnar Styloid Process 25.3 cm   10 cm Proximal to Ulnar Styloid Process  23.4 cm   Just Proximal to Ulnar Styloid Process 16.8 cm   Across Hand at [employer] 18 cm   At [place] of 2nd Digit 6.1 cm     Left Upper Extremity Lymphedema   15 cm Proximal to Olecranon Process 30.6 cm   10 cm Proximal to Olecranon Process 28 cm   Olecranon Process 26.8 cm   15 cm Proximal to Ulnar Styloid Process 25.2 cm   10 cm Proximal to Ulnar Styloid Process 22.5 cm   Just Proximal to Ulnar Styloid Process 16.4 cm   Across Hand at [employer] 17.1 cm   At [place] of 2nd Digit 6 cm                        PT Education - 07/12/16 1720    Education provided Yes   Education Details gave pt information for obtaining a compression sleeve, anatomy and physiology of lymphatic system   Person(s) Educated Patient   Methods Explanation;Handout   Comprehension Verbalized understanding              Breast Clinic Goals - 11/23/15 1634      Patient will be able to verbalize understanding of pertinent lymphedema risk reduction practices relevant to her diagnosis specifically related to skin care.   Time 1   Period Days   Status Achieved     Patient will be able to return demonstrate and/or verbalize understanding of the post-op home exercise program related to regaining shoulder range of motion.   Time 1   Period Days   Status Achieved     Patient will be able to verbalize understanding of the importance of attending the postoperative After Breast Cancer Class for further lymphedema risk reduction education and therapeutic exercise.   Time 1   Period Days   Status Achieved          Long Term Clinic Goals - 07/12/16 1726      CC Long Term Goal  #1   Title Patient will be independent in self drainage technique for long  term management of edema   Time 4   Period Weeks   Status New     CC Long Term Goal  #2   Title Patient willl be independent in verbalizing lymphedema risk reduction practices   Time 4   Period Weeks   Status New     CC Long Term Goal  #3   Title Patient will obtain an appropriate compression garment for long term management of edema   Time 4   Period Weeks   Status New            Plan - 07/12/16 1723    Clinical Impression Statement Patient presents to PT with R UE lymphedema mainly in upper arm. She underwent a lumpectomy with lymph node removal in April and began developing lymphedema three weeks ago. Her main different is her upper arm which is approximately 3 cm larger in diameter than her left upper arm though she is left handed. Patient would benefit from skilled PT services to help pt obtain an appropriate compression sleeve and for MLD for management of edema and to instruct pt in self drainage technique.    Rehab Potential Excellent   Clinical Impairments Affecting Rehab Potential none   PT Frequency 1x / week   PT Duration 4 weeks   PT Treatment/Interventions Therapeutic exercise;Taping;Manual lymph drainage;Compression bandaging;Manual techniques;ADLs/Self Care Home Management;Orthotic Fit/Training  PT Next Visit Plan begin MLD to RUE and instruct pt in technique, ask if pt was able to obtain a compression sleeve, give lymphedema risk reduction handout   Consulted and Agree with Plan of Care Patient      Patient will benefit from skilled therapeutic intervention in order to improve the following deficits and impairments:  Decreased strength, Decreased knowledge of precautions, Pain, Impaired UE functional use, Decreased range of motion  Visit Diagnosis: Lymphedema, not elsewhere classified - Plan: PT plan of care cert/re-cert      G-Codes - 75/10/25 1729    Functional Assessment Tool Used LLIS   Functional Limitation Other PT primary   Other PT Primary  Current Status (E5277) At least 1 percent but less than 20 percent impaired, limited or restricted   Other PT Primary Goal Status (O2423) 0 percent impaired, limited or restricted       Problem List Patient Active Problem List   Diagnosis Date Noted  . Bilateral lower abdominal pain 03/15/2016  . Breast cancer, female, right 03/15/2016  . Cancer of central portion of female breast, right 11/22/2015  . Peripheral vertigo 04/03/2015  . Dizziness 04/03/2015  . Elevated blood pressure 10/21/2013  . Coronary atherosclerosis of native coronary artery 10/13/2013  . Hyperlipidemia 08/26/2013  . IBS 07/27/2008  . PERSONAL HX COLONIC POLYPS 07/27/2008    Alexia Freestone 07/12/2016, 5:32 PM  [place], [place], [address] Phone: [phone]   Fax:  [phone]  Name: [name] MRN: [id number] Date of Birth: 02-Nov-1934  Allyson Sabal, PT 07/12/16 5:32 PM

## 2016-07-16 ENCOUNTER — Ambulatory Visit: Payer: Medicare HMO | Admitting: Physical Therapy

## 2016-07-16 ENCOUNTER — Encounter: Payer: Self-pay | Admitting: Physical Therapy

## 2016-07-16 DIAGNOSIS — I890 Lymphedema, not elsewhere classified: Secondary | ICD-10-CM | POA: Diagnosis not present

## 2016-07-16 NOTE — Therapy (Signed)
[place], [place], [address] Phone: [phone]   Fax:  [phone]  Physical Therapy Treatment  Patient Details  Name: [name] MRN: [id number] Date of Birth: Jun 19, 1935 Referring Provider: Lindi Adie  Encounter Date: 07/16/2016      PT End of Session - 07/16/16 1022    Visit Number 2   Number of Visits 5   Date for PT Re-Evaluation 08/09/16   PT Start Time 0931   PT Stop Time 1020   PT Time Calculation (min) 49 min   Activity Tolerance Patient tolerated treatment well   Behavior During Therapy [facility] for tasks assessed/performed      Past Medical History:  Diagnosis Date  . Anxiety   . Arthritis   . Bladder cancer ([place]) 2012  . Breast cancer ([place])   . CAD (coronary artery disease) 2/10   S/P PCI (Stent) of LAD. Normal LVF-No angina  . Cancer of central portion of female breast, right 11/22/2015  . Cataract    left eye  . Colonic stricture 2013   and colon adenoma  . Constipation    predominant IBS-miralax and citrucel daily-has used linzess in the past. Dr Macky Lower)  . Diverticulosis of colon (without mention of hemorrhage)   . External hemorrhoids without mention of complication   . Hyperlipidemia   . IBS (irritable bowel syndrome)   . Insomnia   . MSSA (methicillin susceptible Staphylococcus aureus) 2011   buttock abscess  . Osteopenia    mild  . Phlebitis   . PONV (postoperative nausea and vomiting)   . Squamous cell carcinoma, leg 2012   right, left  . Thyroid disease    hypothyroidism  . Vitamin D insufficiency    Dr. Joan Flores    Past Surgical History:  Procedure Laterality Date  . Bladder cancer surgery  2012   Dr Zannie Cove  . CORONARY ANGIOPLASTY WITH STENT PLACEMENT  10/2008  . HEMORRHOID SURGERY    . KNEE SURGERY     Age 49-30  . Left elbow surgery x4    . RADIOACTIVE SEED GUIDED MASTECTOMY WITH AXILLARY SENTINEL LYMPH NODE BIOPSY Right 01/04/2016   Procedure:  RADIOACTIVE SEED GUIDED PARTIAL MASTECTOMY WITH AXILLARY SENTINEL LYMPH NODE BIOPSY;  Surgeon: Autumn Messing III, MD;  Location: [place];  Service: General;  Laterality: Right;  . TONSILLECTOMY    . TOTAL KNEE ARTHROPLASTY  06/2000   Left    There were no vitals filed for this visit.      Subjective Assessment - 07/16/16 0933    Subjective My arm is a little heavy feeling today. This weekend I polished the furniture and it felt like it was more tight and heavy.    Pertinent History Patient was diagnosed on 11/14/15 with right DCIS breast cancer with a small area of invasive breast cancer.  It measures 3.2 cm in the lower outer quadrant, is ER/PR positive, HER2 negative and has a Ki67 of 20%.  Pt underwent right lumpectomy and lymph node dissection (9)   Patient Stated Goals to be able to know what to do to manage edema, reduce the swelling   Currently in Pain? No/denies   Pain Score 0-No pain                         [facility] Adult PT Treatment/Exercise - 07/16/16 0001      Manual Therapy   Manual Therapy Edema management;Manual Lymphatic Drainage (MLD)   Edema  Management issued handout and educated pt on lymphedema risk reduction strategies   Manual Lymphatic Drainage (MLD) instructed and had pt demonstrate correct technique while following along with handout throughout:  short neck, 5 diaphragmatic breaths, left axillary nodes, right inguinal nodes, establishment of inter axillary pathways and axillo-inguinal pathway, R UE from shoulder to hand working proximal to distal and moving fluid towards pathways, re establishment of pathways finishing with left axillary and right inguinal nodes                PT Education - 07/16/16 1022    Education provided Yes   Education Details lymphedema risk reduction practices, how to perform self drainage for RUE   Person(s) Educated Patient   Methods Explanation;Handout   Comprehension Verbalized understanding           [facility] Goals - 07/12/16 1726      CC Long Term Goal  #1   Title Patient will be independent in self drainage technique for long term management of edema   Time 4   Period Weeks   Status New     CC Long Term Goal  #2   Title Patient willl be independent in verbalizing lymphedema risk reduction practices   Time 4   Period Weeks   Status New     CC Long Term Goal  #3   Title Patient will obtain an appropriate compression garment for long term management of edema   Time 4   Period Weeks   Status New            Plan - 07/16/16 1023    Clinical Impression Statement Educated pt about lymphedema risk reduction strategies. Instructed patient in self drainage technqiue for RUE and had pt demonstrate correct technique throughout. Educated pt to perform daily. Patient will be measured for a compression sleeve at DME supplier today.    Rehab Potential Excellent   Clinical Impairments Affecting Rehab Potential none   PT Frequency 1x / week   PT Duration 4 weeks   PT Treatment/Interventions Therapeutic exercise;Taping;Manual lymph drainage;Compression bandaging;Manual techniques;ADLs/Self Care Home Management;Orthotic Fit/Training   PT Next Visit Plan continue MLD to RUE and assess for indep from pt, possibly instruct husband, ask if pt was able to obtain a compression sleeve   Consulted and Agree with Plan of Care Patient      Patient will benefit from skilled therapeutic intervention in order to improve the following deficits and impairments:  Decreased strength, Decreased knowledge of precautions, Pain, Impaired UE functional use, Decreased range of motion  Visit Diagnosis: Lymphedema, not elsewhere classified     Problem List Patient Active Problem List   Diagnosis Date Noted  . Bilateral lower abdominal pain 03/15/2016  . Breast cancer, female, right 03/15/2016  . Cancer of central portion of female breast, right 11/22/2015  . Peripheral vertigo 04/03/2015  . Dizziness  04/03/2015  . Elevated blood pressure 10/21/2013  . Coronary atherosclerosis of native coronary artery 10/13/2013  . Hyperlipidemia 08/26/2013  . IBS 07/27/2008  . PERSONAL HX COLONIC POLYPS 07/27/2008    Alexia Freestone 07/16/2016, 10:25 AM  [place], [place], [address] Phone: [phone]   Fax:  [phone]  Name: [name] MRN: [id number] Date of Birth: [birth year]  Allyson Sabal, PT 07/16/16 10:25 AM

## 2016-07-16 NOTE — Patient Instructions (Signed)

## 2016-07-23 ENCOUNTER — Ambulatory Visit: Payer: Medicare HMO | Admitting: Physical Therapy

## 2016-07-23 ENCOUNTER — Encounter: Payer: Self-pay | Admitting: Physical Therapy

## 2016-07-23 DIAGNOSIS — I890 Lymphedema, not elsewhere classified: Secondary | ICD-10-CM

## 2016-07-23 NOTE — Patient Instructions (Signed)

## 2016-07-23 NOTE — Therapy (Signed)
[place], [place], [address] Phone: [phone]   Fax:  [phone]  Physical Therapy Treatment  Patient Details  Name: [name] MRN: [id number] Date of Birth: Oct 02, 1934 Referring Provider: Lindi Adie  Encounter Date: 07/23/2016      PT End of Session - 07/23/16 1306    Visit Number 3   Number of Visits 5   Date for PT Re-Evaluation 08/09/16   PT Start Time 1015   PT Stop Time 1103   PT Time Calculation (min) 48 min   Activity Tolerance Patient tolerated treatment well   Behavior During Therapy [facility] for tasks assessed/performed      Past Medical History:  Diagnosis Date  . Anxiety   . Arthritis   . Bladder cancer ([place]) 2012  . Breast cancer ([place])   . CAD (coronary artery disease) 2/10   S/P PCI (Stent) of LAD. Normal LVF-No angina  . Cancer of central portion of female breast, right 11/22/2015  . Cataract    left eye  . Colonic stricture 2013   and colon adenoma  . Constipation    predominant IBS-miralax and citrucel daily-has used linzess in the past. Dr Macky Lower)  . Diverticulosis of colon (without mention of hemorrhage)   . External hemorrhoids without mention of complication   . Hyperlipidemia   . IBS (irritable bowel syndrome)   . Insomnia   . MSSA (methicillin susceptible Staphylococcus aureus) 2011   buttock abscess  . Osteopenia    mild  . Phlebitis   . PONV (postoperative nausea and vomiting)   . Squamous cell carcinoma, leg 2012   right, left  . Thyroid disease    hypothyroidism  . Vitamin D insufficiency    Dr. Joan Flores    Past Surgical History:  Procedure Laterality Date  . Bladder cancer surgery  2012   Dr Zannie Cove  . CORONARY ANGIOPLASTY WITH STENT PLACEMENT  10/2008  . HEMORRHOID SURGERY    . KNEE SURGERY     Age 79-30  . Left elbow surgery x4    . RADIOACTIVE SEED GUIDED MASTECTOMY WITH AXILLARY SENTINEL LYMPH NODE BIOPSY Right 01/04/2016   Procedure:  RADIOACTIVE SEED GUIDED PARTIAL MASTECTOMY WITH AXILLARY SENTINEL LYMPH NODE BIOPSY;  Surgeon: Autumn Messing III, MD;  Location: [facility];  Service: General;  Laterality: Right;  . TONSILLECTOMY    . TOTAL KNEE ARTHROPLASTY  06/2000   Left    There were no vitals filed for this visit.      Subjective Assessment - 07/23/16 1017    Subjective I got the sleeve last week while I was here. I think I have an increase in swelling in my wrist where it hits me. The massage is going fairly well. I have a question about the pathway going down the trunk.    Pertinent History Patient was diagnosed on 11/14/15 with right DCIS breast cancer with a small area of invasive breast cancer.  It measures 3.2 cm in the lower outer quadrant, is ER/PR positive, HER2 negative and has a Ki67 of 20%.  Pt underwent right lumpectomy and lymph node dissection (9)   Patient Stated Goals to be able to know what to do to manage edema, reduce the swelling   Currently in Pain? No/denies   Pain Score 0-No pain               LYMPHEDEMA/ONCOLOGY QUESTIONNAIRE - 07/23/16 1023      Right Upper Extremity Lymphedema   15 cm  Proximal to Olecranon Process 30.7 cm   10 cm Proximal to Olecranon Process 29 cm   Olecranon Process 26 cm   15 cm Proximal to Ulnar Styloid Process 25 cm   10 cm Proximal to Ulnar Styloid Process 22.6 cm   Just Proximal to Ulnar Styloid Process 16 cm   Across Hand at [employer] 18.5 cm   At [place] of 2nd Digit 6.1 cm                  [facility] Adult PT Treatment/Exercise - 07/23/16 0001      Manual Therapy   Manual Therapy Edema management;Manual Lymphatic Drainage (MLD)   Edema Management assisted pt with donning/doffing compression sleeve and educated pt about appropriate times to wear compression sleeve   Manual Lymphatic Drainage (MLD) instructed and had pt's husband demonstrate correct technique while following along with handout throughout:  short neck, 5 diaphragmatic breaths, left  axillary nodes, right inguinal nodes, establishment of inter axillary pathways and axillo-inguinal pathway, R UE from shoulder to hand working proximal to distal and moving fluid towards pathways, re establishment of pathways finishing with left axillary and right inguinal nodes                 Long Term Clinic Goals - 07/23/16 1059      CC Long Term Goal  #1   Title Patient will be independent in self drainage technique for long term management of edema   Baseline 07/23/16- pt is independent, instructed her husband today and will assess his indep next session   Time 4   Period Weeks   Status On-going     CC Long Term Goal  #2   Title Patient willl be independent in verbalizing lymphedema risk reduction practices   Baseline 07/23/16- pt required min cueing   Period Weeks   Status On-going     CC Long Term Goal  #3   Title Patient will obtain an appropriate compression garment for long term management of edema   Baseline 07/23/16- pt received sleeve   Time 4   Period Weeks   Status Achieved            Plan - 07/23/16 1307    Clinical Impression Statement Instructed pt's husband on correct technique for manual lymphatic drainage and had pt's husband demonstrate technique throughout. Patient states she feels comfortable with self drainage technique. She obtained a compression sleeve from DME supplier and states her wrist seems to swell more when she uses it. Assessed fit of sleeve and it is appropriate. I am not sure why she is having increase swelling in this area. There is no increase today. Her upper arm swelling has decreased and her circumferential measurement 10 cm prox to olecranon process is down 2 cm. Will reassess pt in 2 wks to ensure she and her husband are able to manage her lymphedema independently and to reassess any problems with her sleeve.    Rehab Potential Excellent   Clinical Impairments Affecting Rehab Potential none   PT Frequency 1x / week   PT  Duration 4 weeks   PT Treatment/Interventions Therapeutic exercise;Taping;Manual lymph drainage;Compression bandaging;Manual techniques;ADLs/Self Care Home Management;Orthotic Fit/Training   PT Next Visit Plan continue MLD to RUE and assess for indep from pt, possibly instruct husband, ask if pt was able to obtain a compression sleeve   Consulted and Agree with Plan of Care Patient;Family member/caregiver      Patient will benefit from skilled therapeutic intervention  in order to improve the following deficits and impairments:  Decreased strength, Decreased knowledge of precautions, Pain, Impaired UE functional use, Decreased range of motion  Visit Diagnosis: Lymphedema, not elsewhere classified     Problem List Patient Active Problem List   Diagnosis Date Noted  . Bilateral lower abdominal pain 03/15/2016  . Breast cancer, female, right 03/15/2016  . Cancer of central portion of female breast, right 11/22/2015  . Peripheral vertigo 04/03/2015  . Dizziness 04/03/2015  . Elevated blood pressure 10/21/2013  . Coronary atherosclerosis of native coronary artery 10/13/2013  . Hyperlipidemia 08/26/2013  . IBS 07/27/2008  . PERSONAL HX COLONIC POLYPS 07/27/2008    Alexia Freestone 07/23/2016, 1:10 PM  [place], [place], [address] Phone: [phone]   Fax:  [phone]  Name: JAZLYN TIPPENS MRN: [id number] Date of Birth: October 20, 1934  Allyson Sabal, PT 07/23/16 1:10 PM

## 2016-07-30 ENCOUNTER — Ambulatory Visit: Payer: Medicare HMO | Admitting: Physical Therapy

## 2016-07-30 DIAGNOSIS — H6123 Impacted cerumen, bilateral: Secondary | ICD-10-CM | POA: Diagnosis not present

## 2016-08-01 DIAGNOSIS — H6123 Impacted cerumen, bilateral: Secondary | ICD-10-CM | POA: Diagnosis not present

## 2016-08-06 ENCOUNTER — Ambulatory Visit: Payer: Medicare HMO | Admitting: Physical Therapy

## 2016-08-06 DIAGNOSIS — I890 Lymphedema, not elsewhere classified: Secondary | ICD-10-CM | POA: Diagnosis not present

## 2016-08-06 NOTE — Therapy (Signed)
[place], [place], [address] Phone: [phone]   Fax:  [phone]  Physical Therapy Treatment  Patient Details  Name: [name] MRN: [id number] Date of Birth: 1934/09/17 Referring Provider: Lindi Adie  Encounter Date: 08/06/2016      PT End of Session - 08/06/16 1540    Visit Number 4   Number of Visits 5   Date for PT Re-Evaluation 08/09/16   PT Start Time 1301   PT Stop Time 1350   PT Time Calculation (min) 49 min   Activity Tolerance Patient tolerated treatment well   Behavior During Therapy [facility] for tasks assessed/performed      Past Medical History:  Diagnosis Date  . Anxiety   . Arthritis   . Bladder cancer ([place]) 2012  . Breast cancer ([place])   . CAD (coronary artery disease) 2/10   S/P PCI (Stent) of LAD. Normal LVF-No angina  . Cancer of central portion of female breast, right 11/22/2015  . Cataract    left eye  . Colonic stricture 2013   and colon adenoma  . Constipation    predominant IBS-miralax and citrucel daily-has used linzess in the past. Dr Macky Lower)  . Diverticulosis of colon (without mention of hemorrhage)   . External hemorrhoids without mention of complication   . Hyperlipidemia   . IBS (irritable bowel syndrome)   . Insomnia   . MSSA (methicillin susceptible Staphylococcus aureus) 2011   buttock abscess  . Osteopenia    mild  . Phlebitis   . PONV (postoperative nausea and vomiting)   . Squamous cell carcinoma, leg 2012   right, left  . Thyroid disease    hypothyroidism  . Vitamin D insufficiency    Dr. Joan Flores    Past Surgical History:  Procedure Laterality Date  . Bladder cancer surgery  2012   Dr Zannie Cove  . CORONARY ANGIOPLASTY WITH STENT PLACEMENT  10/2008  . HEMORRHOID SURGERY    . KNEE SURGERY     Age 79-30  . Left elbow surgery x4    . RADIOACTIVE SEED GUIDED MASTECTOMY WITH AXILLARY SENTINEL LYMPH NODE BIOPSY Right 01/04/2016   Procedure:  RADIOACTIVE SEED GUIDED PARTIAL MASTECTOMY WITH AXILLARY SENTINEL LYMPH NODE BIOPSY;  Surgeon: Autumn Messing III, MD;  Location: [facility];  Service: General;  Laterality: Right;  . TONSILLECTOMY    . TOTAL KNEE ARTHROPLASTY  06/2000   Left    There were no vitals filed for this visit.      Subjective Assessment - 08/06/16 1302    Subjective When I wear my compression sleeve it still seems to get puffy at my wrist. I think my swelling is about the same. I do the massage and I do it myself. I do it nearly every day.    Pertinent History Patient was diagnosed on 11/14/15 with right DCIS breast cancer with a small area of invasive breast cancer.  It measures 3.2 cm in the lower outer quadrant, is ER/PR positive, HER2 negative and has a Ki67 of 20%.  Pt underwent right lumpectomy and lymph node dissection (9)   Patient Stated Goals to be able to know what to do to manage edema, reduce the swelling   Currently in Pain? No/denies   Pain Score 0-No pain               LYMPHEDEMA/ONCOLOGY QUESTIONNAIRE - 08/06/16 1304      Right Upper Extremity Lymphedema   15 cm Proximal to Olecranon Process  31.5 cm   10 cm Proximal to Olecranon Process 30 cm   Olecranon Process 26.7 cm   15 cm Proximal to Ulnar Styloid Process 26 cm   10 cm Proximal to Ulnar Styloid Process 23 cm   Just Proximal to Ulnar Styloid Process 17.5 cm   Across Hand at [employer] 18.7 cm   At [place] of 2nd Digit 6.2 cm                  [facility] Adult PT Treatment/Exercise - 08-09-16 0001      Manual Therapy   Manual Therapy Edema management;Manual Lymphatic Drainage (MLD)   Manual Lymphatic Drainage (MLD) instructed pt throughout:  short neck, 5 diaphragmatic breaths, left axillary nodes, right inguinal nodes, establishment of inter axillary pathways and axillo-inguinal pathway, R UE from shoulder to hand working proximal to distal and moving fluid towards pathways, re establishment of pathways finishing with left  axillary and right inguinal nodes                      Long Term Clinic Goals - Aug 09, 2016 1539      CC Long Term Goal  #1   Title Patient will be independent in self drainage technique for long term management of edema   Baseline 07/23/16- pt is independent, instructed her husband today and will assess his indep next session, 08/09/2016- pt reports independence with this   Time 4   Period Weeks   Status Achieved     CC Long Term Goal  #2   Title Patient willl be independent in verbalizing lymphedema risk reduction practices   Baseline 07/23/16- pt required min cueing, 08/09/2016- pt is indep with risk reduction practices   Time 4   Period Weeks   Status Achieved     CC Long Term Goal  #3   Title Patient will obtain an appropriate compression garment for long term management of edema   Baseline 07/23/16- pt received sleeve   Time 4   Period Weeks   Status Achieved            Plan - 2016/08/09 1540    Clinical Impression Statement Patient's UE measurements increased slightly this visit. She reports she has been using her arm more over the Thanksgiving holiday and this morning she exercised without her sleeve. Pt was eduated to wear her sleeve more frequently especially when she is doing anything using her right arm. Pt verbalized understand. She is independent in the massage technique and has the tools to independently manage her edema moving forward and will be discharged from skilled PT services at this time.    Rehab Potential Excellent   Clinical Impairments Affecting Rehab Potential none   PT Frequency 1x / week   PT Duration 4 weeks   PT Treatment/Interventions Therapeutic exercise;Taping;Manual lymph drainage;Compression bandaging;Manual techniques;ADLs/Self Care Home Management;Orthotic Fit/Training   PT Next Visit Plan dc this visit   Consulted and Agree with Plan of Care Patient      Patient will benefit from skilled therapeutic intervention in order to  improve the following deficits and impairments:  Decreased strength, Decreased knowledge of precautions, Pain, Impaired UE functional use, Decreased range of motion  Visit Diagnosis: Lymphedema, not elsewhere classified       G-Codes - Aug 09, 2016 1543    Functional Assessment Tool Used clinical judgement   Functional Limitation Other PT primary   Other PT Primary Goal Status ([id number]) 0 percent impaired, limited or restricted  Other PT Primary Discharge Status [phone]) 0 percent impaired, limited or restricted      Problem List Patient Active Problem List   Diagnosis Date Noted  . Bilateral lower abdominal pain 03/15/2016  . Breast cancer, female, right 03/15/2016  . Cancer of central portion of female breast, right 11/22/2015  . Peripheral vertigo 04/03/2015  . Dizziness 04/03/2015  . Elevated blood pressure 10/21/2013  . Coronary atherosclerosis of native coronary artery 10/13/2013  . Hyperlipidemia 08/26/2013  . IBS 07/27/2008  . PERSONAL HX COLONIC POLYPS 07/27/2008    Alexia Freestone 08/06/2016, 3:44 PM  [place], [place], [address] Phone: [phone]   Fax:  [phone]  Name: [name] MRN: [id number] Date of Birth: 12-01-1934  PHYSICAL THERAPY DISCHARGE SUMMARY  Visits from Start of Care: 4 Current functional level related to goals / functional outcomes: Independent in managing lymphedema   Remaining deficits: none   Education / Equipment: Self MLD Plan: Patient agrees to discharge.  Patient goals were met. Patient is being discharged due to meeting the stated rehab goals.  ?????     Allyson Sabal, PT 08/06/16 3:45 PM

## 2016-08-19 NOTE — Assessment & Plan Note (Signed)
Right lumpectomy 01/04/2016: Microscopic focus of invasive ductal carcinoma less than 0.1 cm with high-grade DCIS, comedo necrosis, less than 0.1 cm the superior margin, 0/9 LN negative, grade 2, ER 100%, PR 5%, HER-2 negative ratio 1.72, Ki-67 20%, T1 N0 stage IA  Patient did not get adjuvant radiation therapy because of minimal microscopic invasive cancer as well as very sensitive skin  Current treatment: Anastrozole 1 mg daily started June 2017, changed to letrozole 2.5 mg daily 05/24/2016 due to dizziness and hot flashes  Letrozole toxicities:   Return to clinic in 6 months to assess tolerability to letrozole.

## 2016-08-20 ENCOUNTER — Encounter: Payer: Self-pay | Admitting: Hematology and Oncology

## 2016-08-20 ENCOUNTER — Ambulatory Visit (HOSPITAL_BASED_OUTPATIENT_CLINIC_OR_DEPARTMENT_OTHER): Payer: Medicare HMO | Admitting: Hematology and Oncology

## 2016-08-20 DIAGNOSIS — C50111 Malignant neoplasm of central portion of right female breast: Secondary | ICD-10-CM

## 2016-08-20 DIAGNOSIS — Z170 Estrogen receptor positive status [ER+]: Secondary | ICD-10-CM | POA: Diagnosis not present

## 2016-08-20 NOTE — Progress Notes (Signed)
Patient Care Team: Seward Carol, MD as PCP - General (Internal Medicine) Sylvan Cheese, NP as Nurse Practitioner (Hematology and Oncology)  DIAGNOSIS:  Encounter Diagnosis  Name Primary?  . Malignant neoplasm of central portion of right breast in female, estrogen receptor positive ([place])     SUMMARY OF ONCOLOGIC HISTORY:   Cancer of central portion of right female breast ([place])   11/08/2015 Mammogram    Right breast 3.2 cm group of pleomorphic calcifications, breast density B, T2 N0 stage II a clinical stage (unclear how much of the calcification is invasive ductal carcinoma versus DCIS)      11/14/2015 Initial Diagnosis    Right breast core biopsy: Small focus of Invasive ductal carcinoma with high-grade DCIS with comedonecrosis, ER 100%, PR 5%, Ki-67 20%, HER-2 negative      01/04/2016 Surgery    Right lumpectomy: Microscopic focus of invasive ductal carcinoma less than 0.1 cm with high-grade DCIS, comedo necrosis, less than 0.1 cm the superior margin, 0/9 LN negative, grade 2, ER 100%, PR 5%, HER-2 negative ratio 1.72, Ki-67 20%, T1 N0 stage IA       02/22/2016 -  Anti-estrogen oral therapy    Anastrozole 1 mg daily switched to letrozole 2.5 mg daily 05/24/2016 due to dizziness and severe hot flashes       CHIEF COMPLIANT: Tolerating letrozole extremely well  INTERVAL HISTORY: [name] is a 81 year old with above-mentioned history of right breast cancer treated with lumpectomy and is currently on adjuvant letrozole. She is tolerating letrozole much better than anastrozole. She does not have any more hot flashes. She still is concerned about the weight gain issues. She currently resides at a retirement home and exercises regularly.  REVIEW OF SYSTEMS:   Constitutional: Denies fevers, chills or abnormal weight loss Eyes: Denies blurriness of vision Ears, nose, mouth, throat, and face: Denies mucositis or sore throat Respiratory: Denies cough, dyspnea or  wheezes Cardiovascular: Denies palpitation, chest discomfort Gastrointestinal:  Denies nausea, heartburn or change in bowel habits Skin: Denies abnormal skin rashes Lymphatics: Denies new lymphadenopathy or easy bruising Neurological:Denies numbness, tingling or new weaknesses Behavioral/Psych: Mood is stable, no new changes  Extremities: No lower extremity edema Breast:  denies any pain or lumps or nodules in either breasts All other systems were reviewed with the patient and are negative.  I have reviewed the past medical history, past surgical history, social history and family history with the patient and they are unchanged from previous note.  ALLERGIES:  is allergic to benadryl [diphenhydramine hcl]; crestor [rosuvastatin]; lipitor [atorvastatin]; morphine and related; tramadol; adhesive [tape]; cortisone; and sulfonamide derivatives.  MEDICATIONS:  Current Outpatient Prescriptions  Medication Sig Dispense Refill  . acetaminophen (TYLENOL) 500 MG tablet Take 500 mg by mouth daily as needed for mild pain or headache.    . ALPRAZolam (XANAX) 0.5 MG tablet Take 0.5 mg by mouth daily as needed for anxiety. Reported on 02/22/2016    . aspirin EC 81 MG tablet Take 81 mg by mouth daily.    . Coenzyme Q10 (COQ-10) 200 MG CAPS Take 1 capsule by mouth daily.    Marland Kitchen ezetimibe (ZETIA) 10 MG tablet Take 1 tablet (10 mg total) by mouth daily. 30 tablet 11  . hyoscyamine (LEVSIN/SL) 0.125 MG SL tablet Place 1 tablet (0.125 mg total) under the tongue every 4 (four) hours as needed. 30 tablet 2  . ibuprofen (ADVIL,MOTRIN) 200 MG tablet Take 200 mg by mouth daily as needed for moderate pain. Reported  on 02/22/2016    . letrozole (FEMARA) 2.5 MG tablet Take 1 tablet (2.5 mg total) by mouth daily. 90 tablet 3  . levothyroxine (SYNTHROID, LEVOTHROID) 50 MCG tablet Take 50 mcg by mouth daily.    . Magnesium Hydroxide (MAGNESIA PO) Take 1 tablet by mouth daily.    . Multiple Vitamins-Minerals (CENTRUM CARDIO  PO) Take 1 tablet by mouth daily.    . pravastatin (PRAVACHOL) 40 MG tablet Take 1 tablet (40 mg total) by mouth daily. 30 tablet 11  . psyllium (HYDROCIL/METAMUCIL) 95 % PACK Take 1 packet by mouth daily.    Marland Kitchen zolpidem (AMBIEN) 10 MG tablet Take 5 mg by mouth At bedtime as needed for sleep. Reported on 01/19/2016     No current facility-administered medications for this visit.     PHYSICAL EXAMINATION: ECOG PERFORMANCE STATUS: 0 - Asymptomatic  Vitals:   08/20/16 1040  BP: (!) 152/72  Pulse: 92  Resp: 16  Temp: 97.8 F (36.6 C)   Filed Weights   08/20/16 1040  Weight: 164 lb 11.2 oz (74.7 kg)    GENERAL:alert, no distress and comfortable SKIN: skin color, texture, turgor are normal, no rashes or significant lesions EYES: normal, Conjunctiva are pink and non-injected, sclera clear OROPHARYNX:no exudate, no erythema and lips, buccal mucosa, and tongue normal  NECK: supple, thyroid normal size, non-tender, without nodularity LYMPH:  no palpable lymphadenopathy in the cervical, axillary or inguinal LUNGS: clear to auscultation and percussion with normal breathing effort HEART: regular rate & rhythm and no murmurs and no lower extremity edema ABDOMEN:abdomen soft, non-tender and normal bowel sounds MUSCULOSKELETAL:no cyanosis of digits and no clubbing  NEURO: alert & oriented x 3 with fluent speech, no focal motor/sensory deficits EXTREMITIES: No lower extremity edema  LABORATORY DATA:  I have reviewed the data as listed   Chemistry      Component Value Date/Time   NA 139 03/15/2016 1027   NA 139 11/23/2015 0810   K 4.4 03/15/2016 1027   K 4.2 11/23/2015 0810   CL 109 03/15/2016 1027   CO2 21 03/15/2016 1027   CO2 25 11/23/2015 0810   BUN 26 (H) 03/15/2016 1027   BUN 21.8 11/23/2015 0810   CREATININE 0.85 03/15/2016 1027   CREATININE 0.9 11/23/2015 0810      Component Value Date/Time   CALCIUM 9.3 03/15/2016 1027   CALCIUM 9.3 11/23/2015 0810   ALKPHOS 72  01/25/2016 0826   ALKPHOS 84 11/23/2015 0810   AST 18 01/25/2016 0826   AST 21 11/23/2015 0810   ALT 16 01/25/2016 0826   ALT 25 11/23/2015 0810   BILITOT 0.6 01/25/2016 0826   BILITOT 0.48 11/23/2015 0810       Lab Results  Component Value Date   WBC 9.7 03/15/2016   HGB 13.4 03/15/2016   HCT 40.0 03/15/2016   MCV 93.6 03/15/2016   PLT 269.0 03/15/2016   NEUTROABS 7.2 03/15/2016    ASSESSMENT & PLAN:  Cancer of central portion of right female breast ([place]) Right lumpectomy 01/04/2016: Microscopic focus of invasive ductal carcinoma less than 0.1 cm with high-grade DCIS, comedo necrosis, less than 0.1 cm the superior margin, 0/9 LN negative, grade 2, ER 100%, PR 5%, HER-2 negative ratio 1.72, Ki-67 20%, T1 N0 stage IA  Patient did not get adjuvant radiation therapy because of minimal microscopic invasive cancer as well as very sensitive skin  Current treatment: Anastrozole 1 mg daily started June 2017, changed to letrozole 2.5 mg daily 05/24/2016 due to  dizziness and hot flashes  Letrozole toxicities: Denies any hot flashes or myalgias.  Return to clinic in 6 months for follow-up.  No orders of the defined types were placed in this encounter.  The patient has a good understanding of the overall plan. she agrees with it. she will call with any problems that may develop before the next visit here.   Rulon Eisenmenger, MD 08/20/16

## 2016-08-23 ENCOUNTER — Ambulatory Visit: Payer: Medicare HMO | Admitting: Hematology and Oncology

## 2016-09-13 ENCOUNTER — Telehealth: Payer: Self-pay | Admitting: Cardiology

## 2016-09-13 DIAGNOSIS — E785 Hyperlipidemia, unspecified: Secondary | ICD-10-CM

## 2016-09-13 NOTE — Telephone Encounter (Signed)
New Message  Pt voiced she is needing labs prior to appt with MD-Turner in March.   Pt voiced her available days for labs in march will be 3/11-3/15 and the last week in march due to pt will be out of town.  Pt voiced she would like for nurse to call her once lab orders have been put in so she can schedule around her available day.  Please f/u with pt

## 2016-09-14 NOTE — Telephone Encounter (Signed)
F/u Phone Call   [name] is calling about orders needed for lab

## 2016-09-14 NOTE — Telephone Encounter (Signed)
Scheduled patient 3/29. She understands to come fasting in case Dr. Radford Pax wants cholesterol checked.

## 2016-09-18 NOTE — Telephone Encounter (Signed)
FLp and ALT 

## 2016-09-18 NOTE — Telephone Encounter (Signed)
Orders placed and linked to appointment.

## 2016-11-15 DIAGNOSIS — Z853 Personal history of malignant neoplasm of breast: Secondary | ICD-10-CM | POA: Diagnosis not present

## 2016-11-27 ENCOUNTER — Encounter: Payer: Self-pay | Admitting: Cardiology

## 2016-12-03 ENCOUNTER — Other Ambulatory Visit: Payer: Medicare HMO | Admitting: *Deleted

## 2016-12-03 DIAGNOSIS — E785 Hyperlipidemia, unspecified: Secondary | ICD-10-CM | POA: Diagnosis not present

## 2016-12-04 LAB — LIPID PANEL
CHOL/HDL RATIO: 1.9 ratio (ref 0.0–4.4)
Cholesterol, Total: 173 mg/dL (ref 100–199)
HDL: 89 mg/dL (ref >39)
LDL Calculated: 64 mg/dL (ref 0–99)
Triglycerides: 98 mg/dL (ref 0–149)
VLDL CHOLESTEROL CAL: 20 mg/dL (ref 5–40)

## 2016-12-04 LAB — ALT: ALT: 19 IU/L (ref 0–32)

## 2016-12-06 ENCOUNTER — Encounter: Payer: Self-pay | Admitting: Cardiology

## 2016-12-06 ENCOUNTER — Ambulatory Visit (INDEPENDENT_AMBULATORY_CARE_PROVIDER_SITE_OTHER): Payer: Medicare HMO | Admitting: Cardiology

## 2016-12-06 ENCOUNTER — Encounter: Payer: Self-pay | Admitting: Gastroenterology

## 2016-12-06 VITALS — BP 158/82 | HR 82 | Ht 62.5 in | Wt 162.8 lb

## 2016-12-06 DIAGNOSIS — I10 Essential (primary) hypertension: Secondary | ICD-10-CM | POA: Diagnosis not present

## 2016-12-06 DIAGNOSIS — I2510 Atherosclerotic heart disease of native coronary artery without angina pectoris: Secondary | ICD-10-CM

## 2016-12-06 DIAGNOSIS — E7800 Pure hypercholesterolemia, unspecified: Secondary | ICD-10-CM | POA: Diagnosis not present

## 2016-12-06 MED ORDER — EZETIMIBE 10 MG PO TABS: 10.0000 mg | ORAL_TABLET | Freq: Every day | ORAL | 3 refills | Status: DC

## 2016-12-06 MED ORDER — PRAVASTATIN SODIUM 40 MG PO TABS: 40.0000 mg | ORAL_TABLET | Freq: Every day | ORAL | 3 refills | Status: DC

## 2016-12-06 NOTE — Progress Notes (Signed)
Cardiology Office Note    Date:  12/06/2016   ID:  [name], DOB 12-01-1934, MRN [id number]  PCP:  Kandice Hams, MD  Cardiologist:  Fransico Him, MD   Chief Complaint  Patient presents with  . Coronary Artery Disease  . Hypertension  . Hyperlipidemia    History of Present Illness:  [name] is a 82 y.o. female with a history of ASCAD s/p PCI of the LAD in 2010 and dyslipidemia who presents today for followup.  She is doing well from a cardiac standpoint.   She denies any chest pain, SOB, DOE,PND, orthopnea, dizziness, palpitations or syncope. She has chronic mild LE edema which is well controlled on diuretics. She exercises with weights at home for 20 minutes and walks some outside for exercise and rides an indoor bike.    Past Medical History:  Diagnosis Date  . Anxiety   . Arthritis   . Bladder cancer ([place]) 2012  . Breast cancer ([place])   . CAD (coronary artery disease) 2/10   S/P PCI (Stent) of LAD. Normal LVF-No angina  . Cancer of central portion of female breast, right 11/22/2015  . Cataract    left eye  . Colonic stricture 2013   and colon adenoma  . Constipation    predominant IBS-miralax and citrucel daily-has used linzess in the past. Dr Macky Lower)  . Diverticulosis of colon (without mention of hemorrhage)   . External hemorrhoids without mention of complication   . Hyperlipidemia   . IBS (irritable bowel syndrome)   . Insomnia   . MSSA (methicillin susceptible Staphylococcus aureus) 2011   buttock abscess  . Osteopenia    mild  . Phlebitis   . PONV (postoperative nausea and vomiting)   . Squamous cell carcinoma, leg 2012   right, left  . Thyroid disease    hypothyroidism  . Vitamin D insufficiency    Dr. Joan Flores    Past Surgical History:  Procedure Laterality Date  . Bladder cancer surgery  2012   Dr Zannie Cove  . CORONARY ANGIOPLASTY WITH STENT PLACEMENT  10/2008  . HEMORRHOID SURGERY    . KNEE SURGERY     Age 2-30  . Left elbow  surgery x4    . RADIOACTIVE SEED GUIDED MASTECTOMY WITH AXILLARY SENTINEL LYMPH NODE BIOPSY Right 01/04/2016   Procedure: RADIOACTIVE SEED GUIDED PARTIAL MASTECTOMY WITH AXILLARY SENTINEL LYMPH NODE BIOPSY;  Surgeon: Autumn Messing III, MD;  Location: [facility];  Service: General;  Laterality: Right;  . TONSILLECTOMY    . TOTAL KNEE ARTHROPLASTY  06/2000   Left    Current Medications: Current Meds  Medication Sig  . acetaminophen (TYLENOL) 500 MG tablet Take 500 mg by mouth daily as needed for mild pain or headache.  . ALPRAZolam (XANAX) 0.5 MG tablet Take 0.5 mg by mouth daily as needed for anxiety. Reported on 02/22/2016  . aspirin EC 81 MG tablet Take 81 mg by mouth daily.  . Coenzyme Q10 (COQ-10) 200 MG CAPS Take 1 capsule by mouth daily.  Marland Kitchen ezetimibe (ZETIA) 10 MG tablet Take 1 tablet (10 mg total) by mouth daily.  . hyoscyamine (LEVSIN/SL) 0.125 MG SL tablet Place 1 tablet (0.125 mg total) under the tongue every 4 (four) hours as needed.  Marland Kitchen ibuprofen (ADVIL,MOTRIN) 200 MG tablet Take 200 mg by mouth daily as needed for moderate pain. Reported on 02/22/2016  . letrozole (FEMARA) 2.5 MG tablet Take 1 tablet (2.5 mg total) by mouth daily.  Marland Kitchen levothyroxine (SYNTHROID,  LEVOTHROID) 50 MCG tablet Take 50 mcg by mouth daily.  . Magnesium Hydroxide (MAGNESIA PO) Take 1 tablet by mouth daily.  . Multiple Vitamins-Minerals (CENTRUM CARDIO PO) Take 1 tablet by mouth daily.  . pravastatin (PRAVACHOL) 40 MG tablet Take 1 tablet (40 mg total) by mouth daily.  . psyllium (HYDROCIL/METAMUCIL) 95 % PACK Take 1 packet by mouth daily.  Marland Kitchen zolpidem (AMBIEN) 10 MG tablet Take 5 mg by mouth At bedtime as needed for sleep. Reported on 01/19/2016    Allergies:   Benadryl [diphenhydramine hcl]; Crestor [rosuvastatin]; Lipitor [atorvastatin]; Morphine and related; Tramadol; Adhesive [tape]; Cortisone; and Sulfonamide derivatives   Social History   Social History  . Marital status: Married    Spouse name: N/A  .  Number of children: 2  . Years of education: N/A   Occupational History  . Retired    Social History Main Topics  . Smoking status: Former Smoker    Types: Cigarettes    Quit date: 09/10/1984  . Smokeless tobacco: Never Used     Comment: Quit tobacco 30 years ago.  . Alcohol use 4.2 oz/week    7 Glasses of wine per week     Comment: wine  . Drug use: No  . Sexual activity: No     Comment: husband vasectomy   Other Topics Concern  . None   Social History Narrative   Lives at home with husband.       Family History:  The patient's family history includes Alzheimer's disease in her mother; Breast cancer (age of onset: 75) in her sister; Coronary artery disease (age of onset: 66) in her son; Coronary artery disease (age of onset: 67) in her paternal uncle; Diabetes in her maternal grandmother; Heart attack in her maternal grandfather; Heart attack (age of onset: 62) in her father; Heart disease (age of onset: 49) in her father; Hiatal hernia in her sister; Hypertension in her brother; Lung cancer in her brother; Stroke in her son; Ulcers in her mother.   ROS:   Please see the history of present illness.    ROS All other systems reviewed and are negative.  No flowsheet data found.     PHYSICAL EXAM:   VS:  BP (!) 158/82   Pulse 82   Ht 5' 2.5" (1.588 m)   Wt 162 lb 12.8 oz (73.8 kg)   SpO2 95%   BMI 29.30 kg/m    GEN: Well nourished, well developed, in no acute distress  HEENT: normal  Neck: no JVD, carotid bruits, or masses Cardiac: RRR; no murmurs, rubs, or gallops,no edema.  Intact distal pulses bilaterally.  Respiratory:  clear to auscultation bilaterally, normal work of breathing GI: soft, nontender, nondistended, + BS MS: no deformity or atrophy  Skin: warm and dry, no rash Neuro:  Alert and Oriented x 3, Strength and sensation are intact Psych: euthymic mood, full affect  Wt Readings from Last 3 Encounters:  12/06/16 162 lb 12.8 oz (73.8 kg)  08/20/16 164 lb  11.2 oz (74.7 kg)  05/24/16 163 lb 11.2 oz (74.3 kg)      Studies/Labs Reviewed:   EKG:  EKG is ordered today.  The ekg ordered today demonstrates NSR at 80bpm with no ST changes and normal intervals  Recent Labs: 03/15/2016: BUN 26; Creatinine, Ser 0.85; Hemoglobin 13.4; Platelets 269.0; Potassium 4.4; Sodium 139 12/03/2016: ALT 19   Lipid Panel    Component Value Date/Time   CHOL 173 12/03/2016 0809   CHOL 164  06/21/2014 0829   TRIG 98 12/03/2016 0809   TRIG 115 06/21/2014 0829   HDL 89 12/03/2016 0809   HDL 78 06/21/2014 0829   CHOLHDL 1.9 12/03/2016 0809   CHOLHDL 1.8 01/25/2016 0826   VLDL 17 01/25/2016 0826   LDLCALC 64 12/03/2016 0809   LDLCALC 63 06/21/2014 0829    Additional studies/ records that were reviewed today include:  none    ASSESSMENT:    1. Atherosclerosis of native coronary artery of native heart without angina pectoris   2. Benign essential HTN   3. Pure hypercholesterolemia      PLAN:  In order of problems listed above:  1. ASCAD s/p PCI of the LAD in 2010. She has not had any anginal symptoms since I saw her last. She will continue on ASA and statin.  2. HTN - BP is elevated today in the office.  Her BP yesterday at home was 144/56mmHg.  I have asked her to check her BP daily for a week and call with the results.   3. Hyperlipidemia with LDL goal < 70.  She will continue on statin and Zetia.   Her LDL is at goal at 64 on last check 11/2016.     Medication Adjustments/Labs and Tests Ordered: Current medicines are reviewed at length with the patient today.  Concerns regarding medicines are outlined above.  Medication changes, Labs and Tests ordered today are listed in the Patient Instructions below.  There are no Patient Instructions on file for this visit.   Signed, Fransico Him, MD  12/06/2016 9:45 AM    [facility] [address], [place], [place]  [address] Phone: [phone]; Fax: [phone]

## 2016-12-06 NOTE — Patient Instructions (Signed)
Medication Instructions:  Your physician recommends that you continue on your current medications as directed. Please refer to the Current Medication list given to you today.   Labwork: None  Testing/Procedures: None  Follow-Up: Your physician wants you to follow-up in: 1 year with Dr. Radford Pax. You will receive a reminder letter in the mail two months in advance. If you don't receive a letter, please call our office to schedule the follow-up appointment.   Any Other Special Instructions Will Be Listed Below (If Applicable). Please check your BLOOD PRESSURE daily for a week and call with results.    If you need a refill on your cardiac medications before your next appointment, please call your pharmacy.

## 2016-12-17 ENCOUNTER — Telehealth: Payer: Self-pay | Admitting: Cardiology

## 2016-12-17 NOTE — Telephone Encounter (Signed)
Pt called to give  BP readings to  Dr. Radford Pax. Pt said that MD asked  pt to take her BP  and keep  a record for a week, and call results to the office.

## 2016-12-17 NOTE — Telephone Encounter (Signed)
BP readings are good - no change in meds at this time 

## 2016-12-17 NOTE — Telephone Encounter (Signed)
New message    Pt c/o BP issue: STAT if pt c/o blurred vision, one-sided weakness or slurred speech  1. What are your last 5 BP readings? 119/91 131/64 135/65 156/59 145/72 153/63 138/74  2. Are you having any other symptoms (ex. Dizziness, headache, blurred vision, passed out)? no  3. What is your BP issue? Dr Radford Pax asked her to take bp for a week and turn in results.

## 2016-12-17 NOTE — Telephone Encounter (Signed)
Forwarded to Dr. Radford Pax for review.

## 2016-12-18 NOTE — Telephone Encounter (Signed)
Informed patient of Dr. Theodosia Blender note and verbal understanding expressed.   She was grateful for call.

## 2017-01-16 DIAGNOSIS — N3281 Overactive bladder: Secondary | ICD-10-CM | POA: Diagnosis not present

## 2017-01-16 DIAGNOSIS — N281 Cyst of kidney, acquired: Secondary | ICD-10-CM | POA: Diagnosis not present

## 2017-01-16 DIAGNOSIS — C678 Malignant neoplasm of overlapping sites of bladder: Secondary | ICD-10-CM | POA: Diagnosis not present

## 2017-02-18 ENCOUNTER — Ambulatory Visit (HOSPITAL_BASED_OUTPATIENT_CLINIC_OR_DEPARTMENT_OTHER): Payer: Medicare HMO | Admitting: Hematology and Oncology

## 2017-02-18 ENCOUNTER — Encounter: Payer: Self-pay | Admitting: Hematology and Oncology

## 2017-02-18 DIAGNOSIS — Z170 Estrogen receptor positive status [ER+]: Secondary | ICD-10-CM

## 2017-02-18 DIAGNOSIS — C50111 Malignant neoplasm of central portion of right female breast: Secondary | ICD-10-CM

## 2017-02-18 MED ORDER — LETROZOLE 2.5 MG PO TABS: 2.5000 mg | ORAL_TABLET | Freq: Every day | ORAL | 3 refills | Status: DC

## 2017-02-18 NOTE — Progress Notes (Signed)
Patient Care Team: Seward Carol, MD as PCP - General (Internal Medicine) Sylvan Cheese, NP as Nurse Practitioner (Hematology and Oncology)  DIAGNOSIS:  Encounter Diagnosis  Name Primary?  . Malignant neoplasm of central portion of right breast in female, estrogen receptor positive ([place])     SUMMARY OF ONCOLOGIC HISTORY:   Cancer of central portion of right female breast ([place])   11/08/2015 Mammogram    Right breast 3.2 cm group of pleomorphic calcifications, breast density B, T2 N0 stage II a clinical stage (unclear how much of the calcification is invasive ductal carcinoma versus DCIS)      11/14/2015 Initial Diagnosis    Right breast core biopsy: Small focus of Invasive ductal carcinoma with high-grade DCIS with comedonecrosis, ER 100%, PR 5%, Ki-67 20%, HER-2 negative      01/04/2016 Surgery    Right lumpectomy: Microscopic focus of invasive ductal carcinoma less than 0.1 cm with high-grade DCIS, comedo necrosis, less than 0.1 cm the superior margin, 0/9 LN negative, grade 2, ER 100%, PR 5%, HER-2 negative ratio 1.72, Ki-67 20%, T1 N0 stage IA       02/22/2016 -  Anti-estrogen oral therapy    Anastrozole 1 mg daily switched to letrozole 2.5 mg daily 05/24/2016 due to dizziness and severe hot flashes       CHIEF COMPLIANT: Follow-up on letrozole therapy  INTERVAL HISTORY: [name] is a 82 year old with above-mentioned history of right breast cancer treated with lumpectomy and is currently on letrozole therapy. She could not tolerate anastrozole but she is tolerating letrozole extremely well. She does not report any hot flashes or myalgias. She had a lot of dizziness with anastrozole which has not been properly with letrozole. Her husband had a stroke recently and is going through rehabilitation and is getting stronger. He is able to walk with the help of a walker today.  REVIEW OF SYSTEMS:   Constitutional: Denies fevers, chills or abnormal weight loss Eyes:  Denies blurriness of vision Ears, nose, mouth, throat, and face: Denies mucositis or sore throat Respiratory: Denies cough, dyspnea or wheezes Cardiovascular: Denies palpitation, chest discomfort Gastrointestinal:  Denies nausea, heartburn or change in bowel habits Skin: Denies abnormal skin rashes Lymphatics: Denies new lymphadenopathy or easy bruising Neurological:Denies numbness, tingling or new weaknesses Behavioral/Psych: Mood is stable, no new changes  Extremities: No lower extremity edema Breast:  denies any pain or lumps or nodules in either breasts All other systems were reviewed with the patient and are negative.  I have reviewed the past medical history, past surgical history, social history and family history with the patient and they are unchanged from previous note.  ALLERGIES:  is allergic to benadryl [diphenhydramine hcl]; crestor [rosuvastatin]; lipitor [atorvastatin]; morphine and related; tramadol; adhesive [tape]; cortisone; and sulfonamide derivatives.  MEDICATIONS:  Current Outpatient Prescriptions  Medication Sig Dispense Refill  . acetaminophen (TYLENOL) 500 MG tablet Take 500 mg by mouth daily as needed for mild pain or headache.    . ALPRAZolam (XANAX) 0.5 MG tablet Take 0.5 mg by mouth daily as needed for anxiety. Reported on 02/22/2016    . aspirin EC 81 MG tablet Take 81 mg by mouth daily.    . Coenzyme Q10 (COQ-10) 200 MG CAPS Take 1 capsule by mouth daily.    Marland Kitchen ezetimibe (ZETIA) 10 MG tablet Take 1 tablet (10 mg total) by mouth daily. 90 tablet 3  . hyoscyamine (LEVSIN/SL) 0.125 MG SL tablet Place 1 tablet (0.125 mg total) under the tongue  every 4 (four) hours as needed. 30 tablet 2  . ibuprofen (ADVIL,MOTRIN) 200 MG tablet Take 200 mg by mouth daily as needed for moderate pain. Reported on 02/22/2016    . letrozole (FEMARA) 2.5 MG tablet Take 1 tablet (2.5 mg total) by mouth daily. 90 tablet 3  . levothyroxine (SYNTHROID, LEVOTHROID) 50 MCG tablet Take 50 mcg  by mouth daily.    . Magnesium Hydroxide (MAGNESIA PO) Take 1 tablet by mouth daily.    . Multiple Vitamins-Minerals (CENTRUM CARDIO PO) Take 1 tablet by mouth daily.    . pravastatin (PRAVACHOL) 40 MG tablet Take 1 tablet (40 mg total) by mouth daily. 90 tablet 3  . psyllium (HYDROCIL/METAMUCIL) 95 % PACK Take 1 packet by mouth daily.    Marland Kitchen zolpidem (AMBIEN) 10 MG tablet Take 5 mg by mouth At bedtime as needed for sleep. Reported on 01/19/2016     No current facility-administered medications for this visit.     PHYSICAL EXAMINATION: ECOG PERFORMANCE STATUS: 0 - Asymptomatic  Vitals:   02/18/17 1017  BP: (!) 164/71  Pulse: 83  Resp: 18  Temp: 97.8 F (36.6 C)   Filed Weights   02/18/17 1017  Weight: 163 lb 9.6 oz (74.2 kg)    GENERAL:alert, no distress and comfortable SKIN: skin color, texture, turgor are normal, no rashes or significant lesions EYES: normal, Conjunctiva are pink and non-injected, sclera clear OROPHARYNX:no exudate, no erythema and lips, buccal mucosa, and tongue normal  NECK: supple, thyroid normal size, non-tender, without nodularity LYMPH:  no palpable lymphadenopathy in the cervical, axillary or inguinal LUNGS: clear to auscultation and percussion with normal breathing effort HEART: regular rate & rhythm and no murmurs and no lower extremity edema ABDOMEN:abdomen soft, non-tender and normal bowel sounds MUSCULOSKELETAL:no cyanosis of digits and no clubbing  NEURO: alert & oriented x 3 with fluent speech, no focal motor/sensory deficits EXTREMITIES: No lower extremity edema BREAST: No palpable masses or nodules in either right or left breasts. No palpable axillary supraclavicular or infraclavicular adenopathy no breast tenderness or nipple discharge. (exam performed in the presence of a chaperone)  LABORATORY DATA:  I have reviewed the data as listed   Chemistry      Component Value Date/Time   NA 139 03/15/2016 1027   NA 139 11/23/2015 0810   K 4.4  03/15/2016 1027   K 4.2 11/23/2015 0810   CL 109 03/15/2016 1027   CO2 21 03/15/2016 1027   CO2 25 11/23/2015 0810   BUN 26 (H) 03/15/2016 1027   BUN 21.8 11/23/2015 0810   CREATININE 0.85 03/15/2016 1027   CREATININE 0.9 11/23/2015 0810      Component Value Date/Time   CALCIUM 9.3 03/15/2016 1027   CALCIUM 9.3 11/23/2015 0810   ALKPHOS 72 01/25/2016 0826   ALKPHOS 84 11/23/2015 0810   AST 18 01/25/2016 0826   AST 21 11/23/2015 0810   ALT 19 12/03/2016 0809   ALT 25 11/23/2015 0810   BILITOT 0.6 01/25/2016 0826   BILITOT 0.48 11/23/2015 0810       Lab Results  Component Value Date   WBC 9.7 03/15/2016   HGB 13.4 03/15/2016   HCT 40.0 03/15/2016   MCV 93.6 03/15/2016   PLT 269.0 03/15/2016   NEUTROABS 7.2 03/15/2016    ASSESSMENT & PLAN:  Cancer of central portion of right female breast ([place]) Right lumpectomy 01/04/2016: Microscopic focus of invasive ductal carcinoma less than 0.1 cm with high-grade DCIS, comedo necrosis, less than 0.1 cm  the superior margin, 0/9 LN negative, grade 2, ER 100%, PR 5%, HER-2 negative ratio 1.72, Ki-67 20%, T1 N0 stage IA  Patient did not get adjuvant radiation therapy because of minimal microscopic invasive cancer as well as very sensitive skin  Current treatment: Anastrozole 1 mg daily started June 2017, changed to letrozole 2.5 mg daily 05/24/2016 due to dizziness and hot flashes  Letrozole toxicities: Denies any hot flashes or myalgias.  Surveillance: 1. Mammograms done at [facility] 12/2016 2. breast exam 02/18/2017: Benign Return to clinic in 1 year for follow-up.   I spent 25 minutes talking to the patient of which more than half was spent in counseling and coordination of care.  No orders of the defined types were placed in this encounter.  The patient has a good understanding of the overall plan. she agrees with it. she will call with any problems that may develop before the next visit here.   Rulon Eisenmenger,  MD 02/18/17

## 2017-02-18 NOTE — Assessment & Plan Note (Signed)
Right lumpectomy 01/04/2016: Microscopic focus of invasive ductal carcinoma less than 0.1 cm with high-grade DCIS, comedo necrosis, less than 0.1 cm the superior margin, 0/9 LN negative, grade 2, ER 100%, PR 5%, HER-2 negative ratio 1.72, Ki-67 20%, T1 N0 stage IA  Patient did not get adjuvant radiation therapy because of minimal microscopic invasive cancer as well as very sensitive skin  Current treatment: Anastrozole 1 mg daily started June 2017, changed to letrozole 2.5 mg daily 05/24/2016 due to dizziness and hot flashes  Letrozole toxicities: Denies any hot flashes or myalgias.  Surveillance: 1. Mammograms will need to be ordered 2. breast exam 02/18/2017: Benign Return to clinic in 1 year for follow-up.

## 2017-02-26 ENCOUNTER — Ambulatory Visit: Payer: Medicare HMO | Admitting: Certified Nurse Midwife

## 2017-03-07 ENCOUNTER — Ambulatory Visit (INDEPENDENT_AMBULATORY_CARE_PROVIDER_SITE_OTHER): Payer: Medicare HMO | Admitting: Certified Nurse Midwife

## 2017-03-07 ENCOUNTER — Encounter: Payer: Self-pay | Admitting: Certified Nurse Midwife

## 2017-03-07 VITALS — BP 112/74 | HR 68 | Resp 16 | Ht 62.25 in | Wt 164.0 lb

## 2017-03-07 DIAGNOSIS — N952 Postmenopausal atrophic vaginitis: Secondary | ICD-10-CM | POA: Diagnosis not present

## 2017-03-07 DIAGNOSIS — Z853 Personal history of malignant neoplasm of breast: Secondary | ICD-10-CM | POA: Diagnosis not present

## 2017-03-07 DIAGNOSIS — Z01419 Encounter for gynecological examination (general) (routine) without abnormal findings: Secondary | ICD-10-CM | POA: Diagnosis not present

## 2017-03-07 NOTE — Progress Notes (Signed)
82 y.o. G52P2002 Married  Caucasian Fe here for annual exam. Menopausal no HRT. Denies vaginal bleeding or vaginal dryness. Spouse had stroke in 1/18 with weakness in one side, in therapy and back home now at [facility].  Has had TIA's x 2 in past 2 months.. Is able to drive [employer] now! She feels  emotionally well with support. Children do not live close, but are involved. Good support with neighbors. Sees Dr. Delfina Redwood yearly for aex,labs,anxiety management.[facility] for labs, cholesterol management. [facility] for cancer follow up and oncology for breast cancer follow up and Femara management. Plans gyn visit yearly. No other health concerns today. Recently celebrated 82th birthday with family and friends!  No LMP recorded. Patient is postmenopausal.          Sexually active: No.  The current method of family planning is vasectomy.    Exercising: Yes.    balance class & chair yoga Smoker:  no  Health Maintenance: Pap:  10-24-10 neg, 02-22-16 neg History of Abnormal Pap: no MMG:  2017 Rt breast cancer grade 2 invasive ductal carcinoma, 2018 requesting copy Self Breast exams: occ Colonoscopy:  2008, report shows also 2013 but pt states she didn't have one BMD:   2011 TDaP:  2013 Shingles: not done Pneumonia: had done Hep C and HIV: not done Labs: pcp   reports that she quit smoking about 32 years ago. Her smoking use included Cigarettes. She has never used smokeless tobacco. She reports that she drinks about 4.2 oz of alcohol per week . She reports that she does not use drugs.  Past Medical History:  Diagnosis Date  . Anxiety   . Arthritis   . Bladder cancer ([place]) 2012  . Breast cancer ([place])   . CAD (coronary artery disease) 2/10   S/P PCI (Stent) of LAD. Normal LVF-No angina  . Cancer of central portion of female breast, right 11/22/2015  . Cataract    left eye  . Colonic stricture ([place]) 2013   and colon adenoma  . Constipation    predominant IBS-miralax and citrucel  daily-has used linzess in the past. Dr Macky Lower)  . Diverticulosis of colon (without mention of hemorrhage)   . External hemorrhoids without mention of complication   . Hyperlipidemia   . IBS (irritable bowel syndrome)   . Insomnia   . MSSA (methicillin susceptible Staphylococcus aureus) 2011   buttock abscess  . Osteopenia    mild  . Phlebitis   . PONV (postoperative nausea and vomiting)   . Squamous cell carcinoma, leg 2012   right, left  . Thyroid disease    hypothyroidism  . Vitamin D insufficiency    Dr. Joan Flores    Past Surgical History:  Procedure Laterality Date  . Bladder cancer surgery  2012   Dr Zannie Cove  . CORONARY ANGIOPLASTY WITH STENT PLACEMENT  10/2008  . HEMORRHOID SURGERY    . KNEE SURGERY     Age [age]  . Left elbow surgery x4    . RADIOACTIVE SEED GUIDED MASTECTOMY WITH AXILLARY SENTINEL LYMPH NODE BIOPSY Right 01/04/2016   Procedure: RADIOACTIVE SEED GUIDED PARTIAL MASTECTOMY WITH AXILLARY SENTINEL LYMPH NODE BIOPSY;  Surgeon: Autumn Messing III, MD;  Location: [facility];  Service: General;  Laterality: Right;  . TONSILLECTOMY    . TOTAL KNEE ARTHROPLASTY  06/2000   Left    Current Outpatient Prescriptions  Medication Sig Dispense Refill  . acetaminophen (TYLENOL) 500 MG tablet Take 500 mg by mouth daily  as needed for mild pain or headache.    . ALPRAZolam (XANAX) 0.5 MG tablet Take 0.5 mg by mouth daily as needed for anxiety. Reported on 02/22/2016    . aspirin EC 81 MG tablet Take 81 mg by mouth daily.    . Coenzyme Q10 (COQ-10) 200 MG CAPS Take 1 capsule by mouth daily.    Marland Kitchen ezetimibe (ZETIA) 10 MG tablet Take 1 tablet (10 mg total) by mouth daily. 90 tablet 3  . ibuprofen (ADVIL,MOTRIN) 200 MG tablet Take 200 mg by mouth daily as needed for moderate pain. Reported on 02/22/2016    . letrozole (FEMARA) 2.5 MG tablet Take 1 tablet (2.5 mg total) by mouth daily. 90 tablet 3  . levothyroxine (SYNTHROID, LEVOTHROID) 50 MCG tablet Take 50 mcg by mouth daily.    .  Magnesium Hydroxide (MAGNESIA PO) Take 1 tablet by mouth daily.    . pravastatin (PRAVACHOL) 40 MG tablet Take 1 tablet (40 mg total) by mouth daily. 90 tablet 3  . psyllium (HYDROCIL/METAMUCIL) 95 % PACK Take 1 packet by mouth daily.    Marland Kitchen zolpidem (AMBIEN) 10 MG tablet Take 5 mg by mouth At bedtime as needed for sleep. Reported on 01/19/2016     No current facility-administered medications for this visit.     Family History  Problem Relation Age of Onset  . Heart disease Father 36       Died of MI  . Heart attack Father 54  . Hiatal hernia Sister   . Breast cancer Sister 7  . Ulcers Mother        peptic  . Alzheimer's disease Mother   . Hypertension Brother   . Lung cancer Brother   . Breast cancer Unknown        aunt  . Coronary artery disease Son 62       MI  . Stroke Son   . Coronary artery disease Paternal Uncle 68       Died with CAD  . Diabetes Maternal Grandmother   . Heart attack Maternal Grandfather        70's  . Colon cancer Neg Hx   . Esophageal cancer Neg Hx   . Rectal cancer Neg Hx   . Stomach cancer Neg Hx     ROS:  Pertinent items are noted in HPI.  Otherwise, a comprehensive ROS was negative.  Exam:   BP 112/74   Pulse 68   Resp 16   Ht 5' 2.25" (1.581 m)   Wt 164 lb (74.4 kg)   BMI 29.76 kg/m  Height: 5' 2.25" (158.1 cm) Ht Readings from Last 3 Encounters:  03/07/17 5' 2.25" (1.581 m)  02/18/17 5' 2.5" (1.588 m)  12/06/16 5' 2.5" (1.588 m)    General appearance: alert, cooperative and appears stated age Head: Normocephalic, without obvious abnormality, atraumatic Neck: no adenopathy, supple, symmetrical, trachea midline and thyroid normal to inspection and palpation Lungs: clear to auscultation bilaterally Breasts: normal appearance, no masses or tenderness, No nipple retraction or dimpling, No nipple discharge or bleeding, No axillary or supraclavicular adenopathy Heart: regular rate and rhythm Abdomen: soft, non-tender; no masses,  no  organomegaly Extremities: extremities normal, atraumatic, no cyanosis or edema Skin: Skin color, texture, turgor normal. No rashes or lesions Lymph nodes: Cervical, supraclavicular, and axillary nodes normal. No abnormal inguinal nodes palpated Neurologic: Grossly normal   Pelvic: External genitalia:  no lesions, atrophic appearance  Urethra:  normal appearing urethra with no masses, tenderness or lesions              Bartholin's and Skene's: normal                 Vagina: atrophic appearing vagina with normal color and discharge, no lesions              Cervix: multiparous appearance, no cervical motion tenderness and no lesions              Pap taken: No. Bimanual Exam:  Uterus:  normal size, contour, position, consistency, mobility, non-tender              Adnexa: normal adnexa and no mass, fullness, tenderness               Rectovaginal: Confirms               Anus:  normal sphincter tone, no lesions  Chaperone present: yes  A:  Well Woman with normal exam  Menopausal no HRT  Cholesterol, Hypothyroid, anxiety with MD management  Oncology management for breast cancer and Femara  Social stress with spouses stroke, but good support  P:   Reviewed health and wellness pertinent to exam  Aware of need to advise if vaginal bleeding  Continue MD follow up as indicated for all her medical issues.  Continue to take time for self with caring for spouse  Pap smear: no   counseled on breast self exam, mammography screening, feminine hygiene, adequate intake of calcium and vitamin D, diet and exercise  return annually or prn  An After Visit Summary was printed and given to the patient.

## 2017-03-07 NOTE — Patient Instructions (Signed)

## 2017-04-02 DIAGNOSIS — H43811 Vitreous degeneration, right eye: Secondary | ICD-10-CM | POA: Diagnosis not present

## 2017-04-02 DIAGNOSIS — H26492 Other secondary cataract, left eye: Secondary | ICD-10-CM | POA: Diagnosis not present

## 2017-04-02 DIAGNOSIS — H02834 Dermatochalasis of left upper eyelid: Secondary | ICD-10-CM | POA: Diagnosis not present

## 2017-04-02 DIAGNOSIS — H02831 Dermatochalasis of right upper eyelid: Secondary | ICD-10-CM | POA: Diagnosis not present

## 2017-04-02 DIAGNOSIS — Z961 Presence of intraocular lens: Secondary | ICD-10-CM | POA: Diagnosis not present

## 2017-04-02 DIAGNOSIS — H04123 Dry eye syndrome of bilateral lacrimal glands: Secondary | ICD-10-CM | POA: Diagnosis not present

## 2017-04-02 DIAGNOSIS — H10413 Chronic giant papillary conjunctivitis, bilateral: Secondary | ICD-10-CM | POA: Diagnosis not present

## 2017-04-04 DIAGNOSIS — R69 Illness, unspecified: Secondary | ICD-10-CM | POA: Diagnosis not present

## 2017-04-17 DIAGNOSIS — Z85828 Personal history of other malignant neoplasm of skin: Secondary | ICD-10-CM | POA: Diagnosis not present

## 2017-04-17 DIAGNOSIS — L570 Actinic keratosis: Secondary | ICD-10-CM | POA: Diagnosis not present

## 2017-04-17 DIAGNOSIS — D225 Melanocytic nevi of trunk: Secondary | ICD-10-CM | POA: Diagnosis not present

## 2017-04-17 DIAGNOSIS — D692 Other nonthrombocytopenic purpura: Secondary | ICD-10-CM | POA: Diagnosis not present

## 2017-04-17 DIAGNOSIS — D485 Neoplasm of uncertain behavior of skin: Secondary | ICD-10-CM | POA: Diagnosis not present

## 2017-04-17 DIAGNOSIS — L821 Other seborrheic keratosis: Secondary | ICD-10-CM | POA: Diagnosis not present

## 2017-04-17 DIAGNOSIS — L814 Other melanin hyperpigmentation: Secondary | ICD-10-CM | POA: Diagnosis not present

## 2017-04-30 DIAGNOSIS — I25119 Atherosclerotic heart disease of native coronary artery with unspecified angina pectoris: Secondary | ICD-10-CM | POA: Diagnosis not present

## 2017-04-30 DIAGNOSIS — C679 Malignant neoplasm of bladder, unspecified: Secondary | ICD-10-CM | POA: Diagnosis not present

## 2017-04-30 DIAGNOSIS — Z0000 Encounter for general adult medical examination without abnormal findings: Secondary | ICD-10-CM | POA: Diagnosis not present

## 2017-04-30 DIAGNOSIS — R69 Illness, unspecified: Secondary | ICD-10-CM | POA: Diagnosis not present

## 2017-04-30 DIAGNOSIS — Z1389 Encounter for screening for other disorder: Secondary | ICD-10-CM | POA: Diagnosis not present

## 2017-04-30 DIAGNOSIS — E7800 Pure hypercholesterolemia, unspecified: Secondary | ICD-10-CM | POA: Diagnosis not present

## 2017-04-30 DIAGNOSIS — G4700 Insomnia, unspecified: Secondary | ICD-10-CM | POA: Diagnosis not present

## 2017-04-30 DIAGNOSIS — C50111 Malignant neoplasm of central portion of right female breast: Secondary | ICD-10-CM | POA: Diagnosis not present

## 2017-04-30 DIAGNOSIS — Z170 Estrogen receptor positive status [ER+]: Secondary | ICD-10-CM | POA: Diagnosis not present

## 2017-04-30 DIAGNOSIS — E039 Hypothyroidism, unspecified: Secondary | ICD-10-CM | POA: Diagnosis not present

## 2017-07-02 DIAGNOSIS — A09 Infectious gastroenteritis and colitis, unspecified: Secondary | ICD-10-CM | POA: Diagnosis not present

## 2017-07-02 DIAGNOSIS — R5383 Other fatigue: Secondary | ICD-10-CM | POA: Diagnosis not present

## 2017-07-02 DIAGNOSIS — R142 Eructation: Secondary | ICD-10-CM | POA: Diagnosis not present

## 2017-07-03 DIAGNOSIS — Z5189 Encounter for other specified aftercare: Secondary | ICD-10-CM | POA: Diagnosis not present

## 2017-07-03 DIAGNOSIS — R5383 Other fatigue: Secondary | ICD-10-CM | POA: Diagnosis not present

## 2017-07-03 DIAGNOSIS — R197 Diarrhea, unspecified: Secondary | ICD-10-CM | POA: Diagnosis not present

## 2017-07-24 DIAGNOSIS — N281 Cyst of kidney, acquired: Secondary | ICD-10-CM | POA: Diagnosis not present

## 2017-07-24 DIAGNOSIS — N3281 Overactive bladder: Secondary | ICD-10-CM | POA: Diagnosis not present

## 2017-07-24 DIAGNOSIS — C678 Malignant neoplasm of overlapping sites of bladder: Secondary | ICD-10-CM | POA: Diagnosis not present

## 2017-09-16 DIAGNOSIS — J069 Acute upper respiratory infection, unspecified: Secondary | ICD-10-CM | POA: Diagnosis not present

## 2017-09-25 IMAGING — CT CT ABD-PELV W/ CM
2 of 5 series · 15 of 46 positions shown, 17 images · IV contrast (ISOVUE 300)
Comparison: CT of the abdomen and pelvis 10/25/2010.

CLINICAL DATA: 81-year-old female with diffuse lower abdominal pain
and cramping for the past 2 weeks. Recently diagnosed with breast
cancer status post surgical resection in December 2015.

EXAM:
CT ABDOMEN AND PELVIS WITH CONTRAST
TECHNIQUE: Multidetector CT imaging of the abdomen and pelvis was performed
using the standard protocol following bolus administration of
intravenous contrast.
CONTRAST:  100mL [id number] IOPAMIDOL ([id number]) INJECTION 61%

[Series 2: abd/ pelvis · axial · 0.80mm/px · z∈[-520,-160]mm · 12 of 82 slices shown, 14 images]
[im 5/82  soft-tissue]
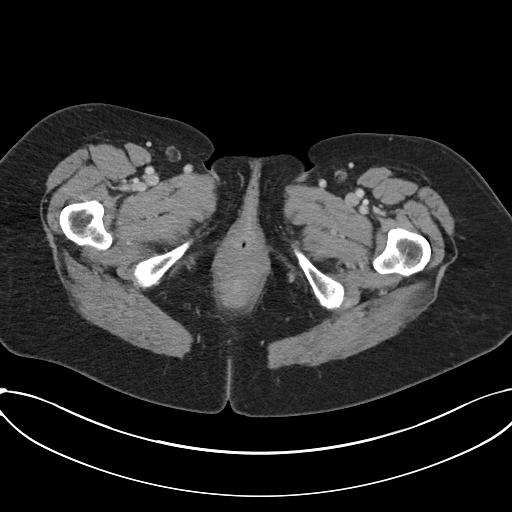
[im 5/82  bone]
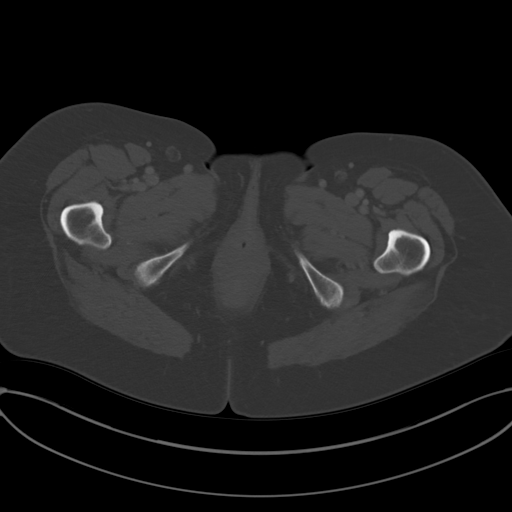
[im 13/82  soft-tissue]
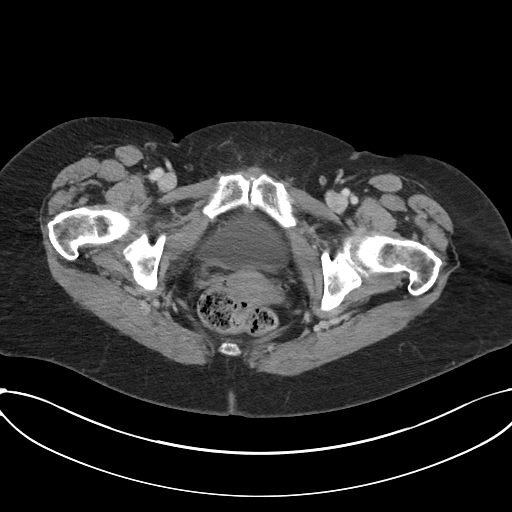
[im 17/82  soft-tissue]
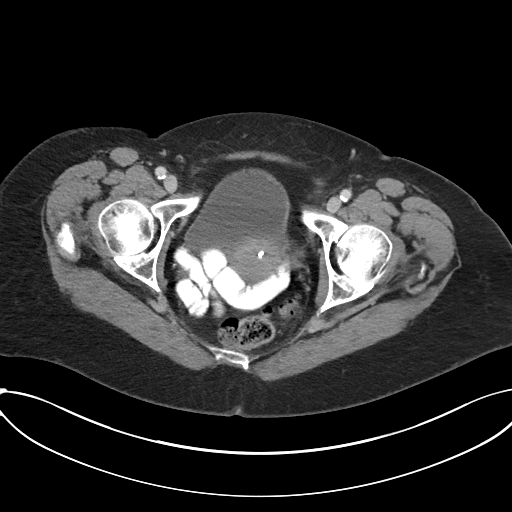
[im 25/82  soft-tissue]
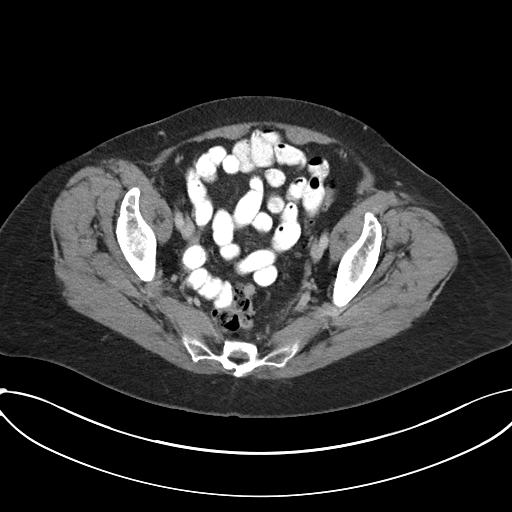
[im 33/82  soft-tissue]
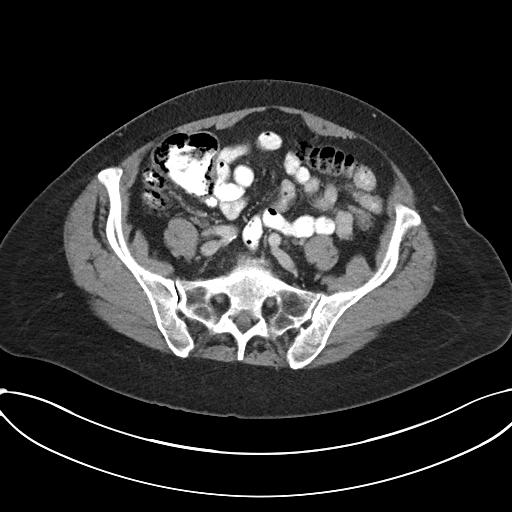
[im 37/82  soft-tissue]
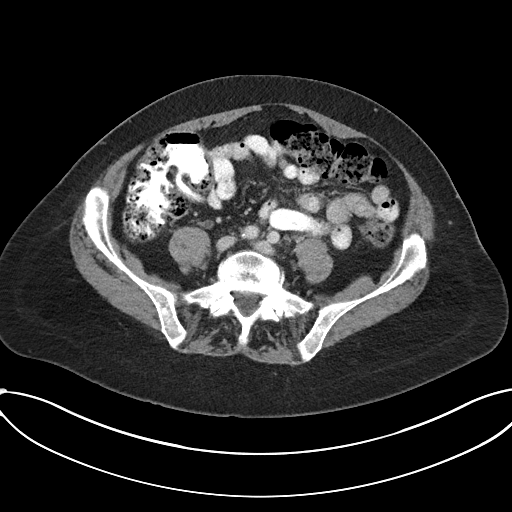
[im 45/82  soft-tissue]
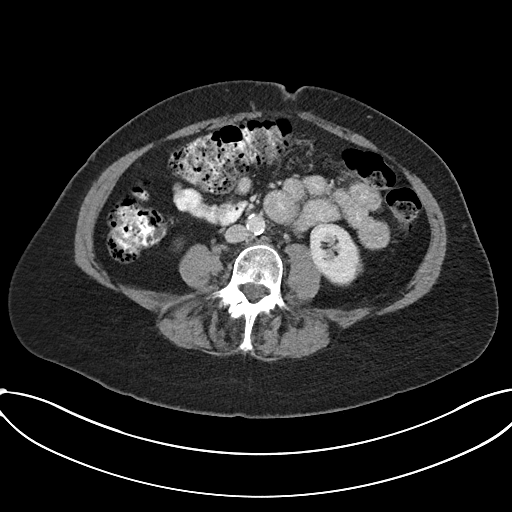
[im 49/82  soft-tissue]
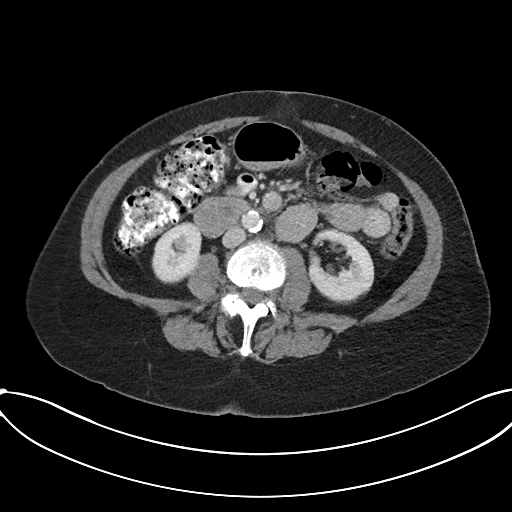
[im 57/82  soft-tissue]
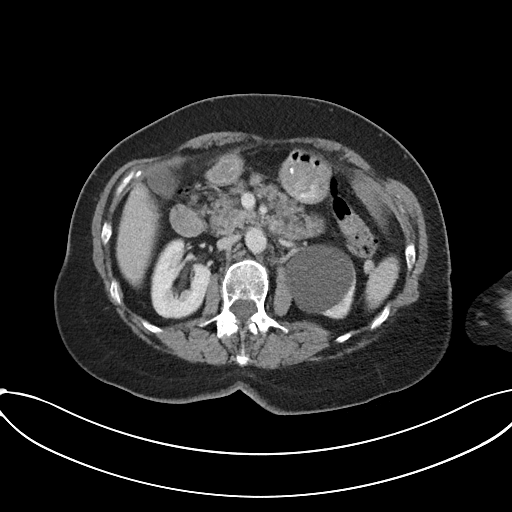
[im 57/82  bone]
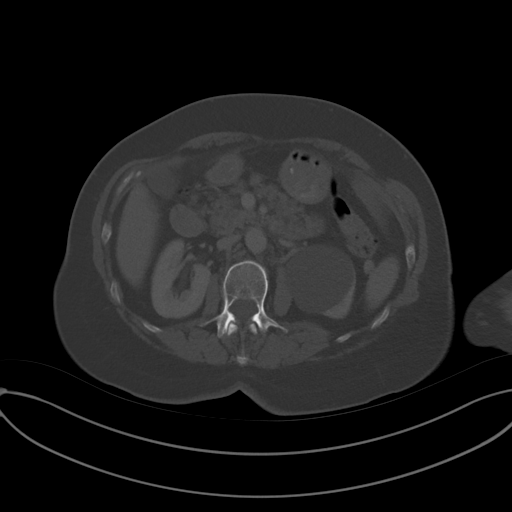
[im 65/82  soft-tissue]
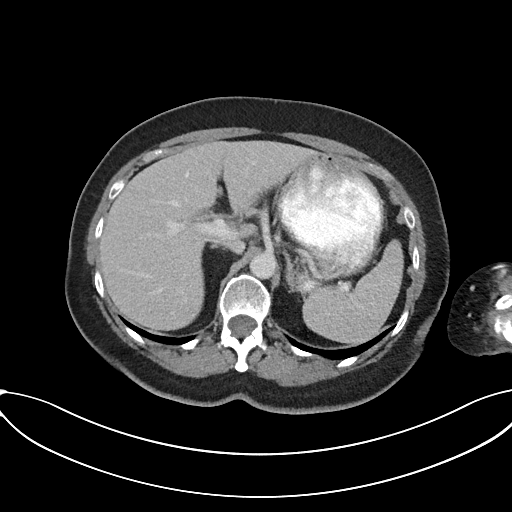
[im 69/82  soft-tissue]
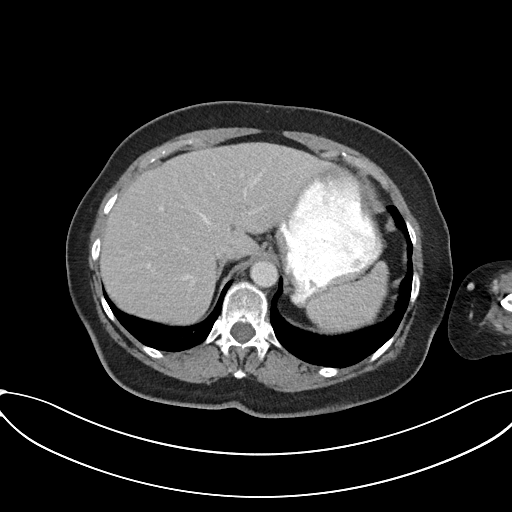
[im 77/82  soft-tissue]
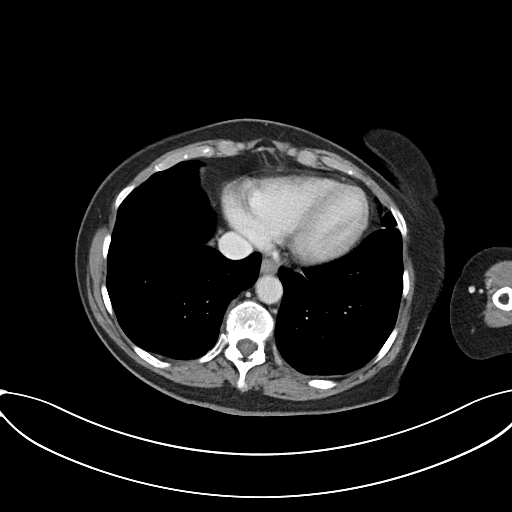

[Series 5: coronal soft tissue · coronal · 0.61mm/px · 3 of 101 slices shown]
[im 34/101  soft-tissue]
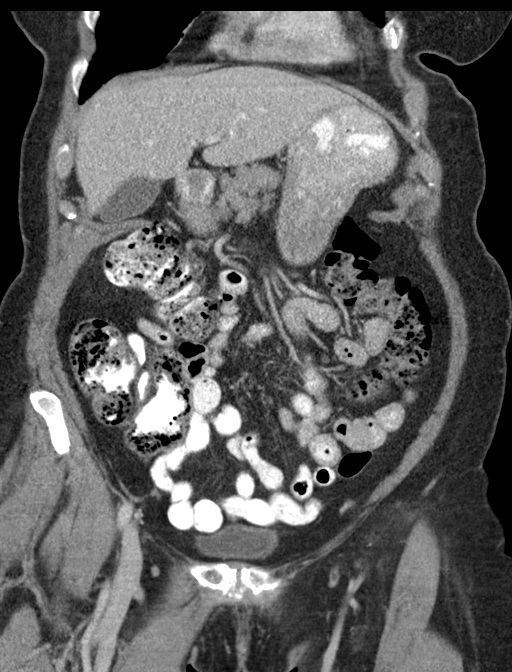
[im 45/101  soft-tissue]
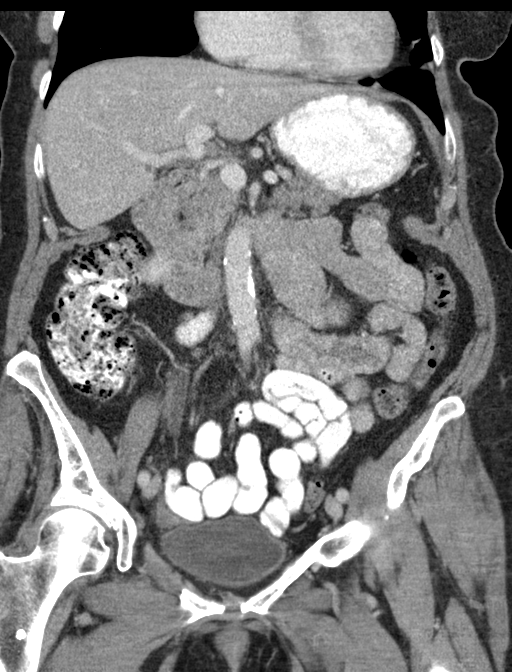
[im 56/101  soft-tissue]
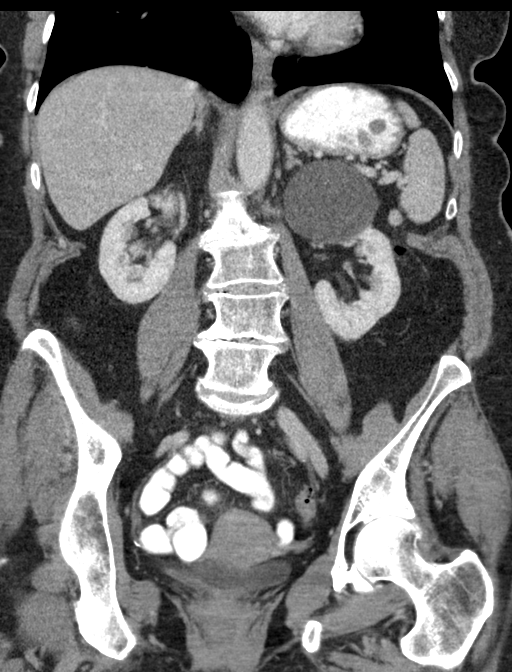

[15 of 46 positions shown; findings below may reference images not displayed]

FINDINGS: Lower chest: Scattered areas of mild scarring are noted throughout
the lung bases bilaterally, most evident in the inferior segment of
the lingula. Small hiatal hernia.

Hepatobiliary: 9 mm low-attenuation lesion in segment 3 of the liver
is too small to definitively characterize, but is similar to the
prior study, and statistically likely a small cyst. No other
suspicious lesions are noted. No intra or extrahepatic biliary
ductal dilatation. Gallbladder is normal in appearance.

Pancreas: No pancreatic mass. No pancreatic ductal dilatation. No
pancreatic or peripancreatic fluid or inflammatory changes.

Spleen: Unremarkable.

Adrenals/Urinary Tract: Low-attenuation lesions in the kidneys
bilaterally, compatible with simple cysts, largest of which is
exophytic in the upper pole the left kidney measuring 6.3 cm in
diameter. No suspicious renal lesions are noted. Bilateral adrenal
glands are normal in appearance. No hydroureteronephrosis. Urinary
bladder is normal in appearance.

Stomach/Bowel: The appearance of the stomach is normal. There is no
pathologic dilatation of small bowel or colon. Several colonic
diverticulae are noted, without surrounding inflammatory changes to
suggest an acute diverticulitis at this time. The appendix is not
confidently identified and may be surgically absent. Regardless,
there are no inflammatory changes noted adjacent to the cecum to
suggest the presence of an acute appendicitis at this time.

Vascular/Lymphatic: Aortic atherosclerosis, in addition to
atherosclerotic plaque throughout additional abdominal and pelvic
vasculature, without evidence of aneurysm or dissection. No
lymphadenopathy noted in the abdomen or pelvis.

Reproductive: Small sub cm calcified fibroid in the anterior aspect
of the uterine body. Ovaries are atrophic.

Other: Tiny umbilical hernia containing only omental fat. No
significant volume of ascites. No pneumoperitoneum.

Musculoskeletal: There are no aggressive appearing lytic or blastic
lesions noted in the visualized portions of the skeleton.
IMPRESSION: 1. No acute findings in the abdomen or pelvis to account for the
patient's symptoms.
2. Aortic atherosclerosis.
3. Small hiatal hernia.
4. Simple cysts in the kidneys bilaterally, largest of which
measures 6.3 cm in the upper pole the left kidney.
5. Additional incidental findings, as above.

## 2017-10-10 DIAGNOSIS — R69 Illness, unspecified: Secondary | ICD-10-CM | POA: Diagnosis not present

## 2017-10-11 ENCOUNTER — Telehealth: Payer: Self-pay | Admitting: Cardiology

## 2017-10-11 DIAGNOSIS — E785 Hyperlipidemia, unspecified: Secondary | ICD-10-CM

## 2017-10-11 NOTE — Telephone Encounter (Signed)
New message    Patient calling to request annual lab order. Please advise

## 2017-10-11 NOTE — Telephone Encounter (Signed)
Patient stated she would like to come in for her annual fasting lipid and ALT labs prior to her follow up appt with Dr. Radford Pax on 12/06/17.   Patient scheduled for fasting lipids and ALT on 12/04/17. Patient in agreement with plan and thanked me for the call.

## 2017-10-23 ENCOUNTER — Encounter: Payer: Self-pay | Admitting: Gastroenterology

## 2017-10-23 ENCOUNTER — Other Ambulatory Visit (INDEPENDENT_AMBULATORY_CARE_PROVIDER_SITE_OTHER): Payer: Medicare HMO

## 2017-10-23 ENCOUNTER — Ambulatory Visit: Payer: Medicare HMO | Admitting: Gastroenterology

## 2017-10-23 VITALS — BP 132/82 | HR 72 | Ht 62.5 in | Wt 164.0 lb

## 2017-10-23 DIAGNOSIS — R197 Diarrhea, unspecified: Secondary | ICD-10-CM

## 2017-10-23 DIAGNOSIS — K921 Melena: Secondary | ICD-10-CM | POA: Diagnosis not present

## 2017-10-23 LAB — CBC WITH DIFFERENTIAL/PLATELET
Basophils Absolute: 0.1 10*3/uL (ref 0.0–0.1)
Basophils Relative: 0.9 % (ref 0.0–3.0)
EOS PCT: 2 % (ref 0.0–5.0)
Eosinophils Absolute: 0.1 10*3/uL (ref 0.0–0.7)
HEMATOCRIT: 39.4 % (ref 36.0–46.0)
HEMOGLOBIN: 13.4 g/dL (ref 12.0–15.0)
LYMPHS PCT: 25.8 % (ref 12.0–46.0)
Lymphs Abs: 1.6 10*3/uL (ref 0.7–4.0)
MCHC: 34 g/dL (ref 30.0–36.0)
MCV: 94.6 fl (ref 78.0–100.0)
MONOS PCT: 8.3 % (ref 3.0–12.0)
Monocytes Absolute: 0.5 10*3/uL (ref 0.1–1.0)
NEUTROS PCT: 63 % (ref 43.0–77.0)
Neutro Abs: 3.9 10*3/uL (ref 1.4–7.7)
Platelets: 268 10*3/uL (ref 150.0–400.0)
RBC: 4.17 Mil/uL (ref 3.87–5.11)
RDW: 12.9 % (ref 11.5–15.5)
WBC: 6.1 10*3/uL (ref 4.0–10.5)

## 2017-10-23 NOTE — Progress Notes (Signed)
I agree with the above note, plan 

## 2017-10-23 NOTE — Progress Notes (Signed)
10/23/2017 [name] [id number] 11-20-1934   HISTORY OF PRESENT ILLNESS:  This is a pleasant 83 year old female who is known to Dr. Ardis Hughs for colonoscopy in April 2013 at which time she had one polyp removed as well as moderate diverticulosis in the left colon with a benign-appearing focal stenosis in the area of diverticulosis. Polyp was a tubular adenoma so she was put in for a 5 year colonoscopy recall, but at this point she defers repeat colonoscopy unless absolutely necessary.  She comes in today with reports of sudden onset lower abdominal pain/cramping with diarrhea.  Says that she developed these symptoms on February 1st and symptoms lasted about 7-8 days before resolving.  Symptoms gone now for almost one week.  No nausea, vomiting, fevers, rectal bleeding.  Was having dark stools but had been taking Pepto-Bismol.  Still having some lower abdominal cramping, but is much improved.  Stools back to normal.  Appetite is good.   Past Medical History:  Diagnosis Date  . Anxiety   . Arthritis   . Bladder cancer ([place]) 2012  . Breast cancer ([place])   . CAD (coronary artery disease) 2/10   S/P PCI (Stent) of LAD. Normal LVF-No angina  . Cancer of central portion of female breast, right 11/22/2015  . Cataract    left eye  . Colonic stricture ([place]) 2013   and colon adenoma  . Constipation    predominant IBS-miralax and citrucel daily-has used linzess in the past. Dr Macky Lower)  . Diverticulosis of colon (without mention of hemorrhage)   . External hemorrhoids without mention of complication   . Hyperlipidemia   . IBS (irritable bowel syndrome)   . Insomnia   . MSSA (methicillin susceptible Staphylococcus aureus) 2011   buttock abscess  . Osteopenia    mild  . Phlebitis   . PONV (postoperative nausea and vomiting)   . Squamous cell carcinoma, leg 2012   right, left  . Thyroid disease    hypothyroidism  . Vitamin D insufficiency    Dr. Joan Flores   Past Surgical History:    Procedure Laterality Date  . Bladder cancer surgery  2012   Dr Zannie Cove  . CORONARY ANGIOPLASTY WITH STENT PLACEMENT  10/2008  . HEMORRHOID SURGERY    . KNEE SURGERY     Age 60-30  . Left elbow surgery x4    . RADIOACTIVE SEED GUIDED PARTIAL MASTECTOMY WITH AXILLARY SENTINEL LYMPH NODE BIOPSY Right 01/04/2016   Procedure: RADIOACTIVE SEED GUIDED PARTIAL MASTECTOMY WITH AXILLARY SENTINEL LYMPH NODE BIOPSY;  Surgeon: Autumn Messing III, MD;  Location: [facility];  Service: General;  Laterality: Right;  . TONSILLECTOMY    . TOTAL KNEE ARTHROPLASTY  06/2000   Left    reports that she quit smoking about 33 years ago. Her smoking use included cigarettes. she has never used smokeless tobacco. She reports that she drinks about 4.2 oz of alcohol per week. She reports that she does not use drugs. family history includes Alzheimer's disease in her mother; Breast cancer in her unknown relative; Breast cancer (age of onset: 64) in her sister; Coronary artery disease (age of onset: 56) in her son; Coronary artery disease (age of onset: 63) in her paternal uncle; Diabetes in her maternal grandmother; Heart attack in her maternal grandfather; Heart attack (age of onset: 30) in her father; Heart disease (age of onset: 20) in her father; Hiatal hernia in her sister; Hypertension in her brother; Lung cancer in her brother; Stroke  in her son; Ulcers in her mother. Allergies  Allergen Reactions  . Benadryl [Diphenhydramine Hcl] Other (See Comments)    hyperactivity  . Crestor [Rosuvastatin] Other (See Comments)    Muscle Aches  . Lipitor [Atorvastatin] Other (See Comments)    Muscle aches  . Morphine And Related Nausea And Vomiting  . Tramadol Nausea And Vomiting  . Adhesive [Tape] Other (See Comments)    Thin and sensitive skin, please use "paper" tape  . Cortisone Rash  . Sulfonamide Derivatives Hives      Outpatient Encounter Medications as of 10/23/2017  Medication Sig  . acetaminophen (TYLENOL) 500 MG  tablet Take 500 mg by mouth daily as needed for mild pain or headache.  . ALPRAZolam (XANAX) 0.5 MG tablet Take 0.5 mg by mouth daily as needed for anxiety. Reported on 02/22/2016  . aspirin EC 81 MG tablet Take 81 mg by mouth daily.  . Coenzyme Q10 (COQ-10) 200 MG CAPS Take 1 capsule by mouth daily.  Marland Kitchen ezetimibe (ZETIA) 10 MG tablet Take 1 tablet (10 mg total) by mouth daily.  Marland Kitchen ibuprofen (ADVIL,MOTRIN) 200 MG tablet Take 200 mg by mouth daily as needed for moderate pain. Reported on 02/22/2016  . letrozole (FEMARA) 2.5 MG tablet Take 1 tablet (2.5 mg total) by mouth daily.  Marland Kitchen levothyroxine (SYNTHROID, LEVOTHROID) 50 MCG tablet Take 50 mcg by mouth daily.  . Magnesium Hydroxide (MAGNESIA PO) Take 1 tablet by mouth daily.  . pravastatin (PRAVACHOL) 40 MG tablet Take 1 tablet (40 mg total) by mouth daily.  . psyllium (HYDROCIL/METAMUCIL) 95 % PACK Take 1 packet by mouth daily.  Marland Kitchen zolpidem (AMBIEN) 10 MG tablet Take 5 mg by mouth At bedtime as needed for sleep. Reported on 01/19/2016   No facility-administered encounter medications on file as of 10/23/2017.      REVIEW OF SYSTEMS  : All other systems reviewed and negative except where noted in the History of Present Illness.   PHYSICAL EXAM: BP 132/82   Pulse 72   Ht 5' 2.5" (1.588 m)   Wt 164 lb (74.4 kg)   BMI 29.52 kg/m  General: Well developed white female in no acute distress Head: Normocephalic and atraumatic Eyes:  Sclerae anicteric, conjunctiva pink. Ears: Normal auditory acuity Lungs: Clear throughout to auscultation; no increased WOB. Heart: Regular rate and rhythm; no M/R/G. Abdomen: Soft, non-distended.  BS present.  Non-tender. Rectal:  Patient decline rectal exam due to history of anal stenosis. Musculoskeletal: Symmetrical with no gross deformities  Skin: No lesions on visible extremities Extremities: No edema  Neurological: Alert oriented x 4, grossly non-focal Psychological:  Alert and cooperative. Normal mood and  affect  ASSESSMENT AND PLAN: *83 year old female who had sudden onset diarrhea, abdominal pain.  Had dark stools as well but was taking Pepto-Bismol.  Symptoms resolved after a week or so and have been resolved for almost a week now as well.  Still has some mild lower abdominal cramping, but also has some underlying IBS.  Will check a CBC.  Otherwise continue to monitor and call back for recurrent symptoms.   CC:  Seward Carol, MD

## 2017-10-23 NOTE — Patient Instructions (Signed)
Your physician has requested that you go to the basement for the following lab work before leaving today: CBC  Please call the office if you have a return of symptoms in the next one week and ask for Katina Degree, Therapist, sports.

## 2017-11-20 ENCOUNTER — Other Ambulatory Visit: Payer: Self-pay | Admitting: Cardiology

## 2017-11-21 DIAGNOSIS — R928 Other abnormal and inconclusive findings on diagnostic imaging of breast: Secondary | ICD-10-CM | POA: Diagnosis not present

## 2017-11-21 DIAGNOSIS — Z853 Personal history of malignant neoplasm of breast: Secondary | ICD-10-CM | POA: Diagnosis not present

## 2017-12-04 ENCOUNTER — Other Ambulatory Visit: Payer: Self-pay | Admitting: Cardiology

## 2017-12-04 ENCOUNTER — Other Ambulatory Visit: Payer: Medicare HMO | Admitting: *Deleted

## 2017-12-04 DIAGNOSIS — E785 Hyperlipidemia, unspecified: Secondary | ICD-10-CM

## 2017-12-04 LAB — LIPID PANEL
CHOL/HDL RATIO: 2.2 ratio (ref 0.0–4.4)
Cholesterol, Total: 179 mg/dL (ref 100–199)
HDL: 83 mg/dL (ref >39)
LDL Calculated: 77 mg/dL (ref 0–99)
TRIGLYCERIDES: 96 mg/dL (ref 0–149)
VLDL Cholesterol Cal: 19 mg/dL (ref 5–40)

## 2017-12-04 LAB — ALT: ALT: 19 IU/L (ref 0–32)

## 2017-12-05 ENCOUNTER — Telehealth: Payer: Self-pay

## 2017-12-05 DIAGNOSIS — E785 Hyperlipidemia, unspecified: Secondary | ICD-10-CM

## 2017-12-05 MED ORDER — PRAVASTATIN SODIUM 80 MG PO TABS: 80.0000 mg | ORAL_TABLET | Freq: Every evening | ORAL | 3 refills | Status: DC

## 2017-12-05 NOTE — Telephone Encounter (Signed)
Notes recorded by Teressa Senter, RN on 12/05/2017 at 4:37 PM EDT Patient made aware of results and instructed to INCREASE pravastatin to 80 mg one time a day. Patient in agreement with treatment plan and scheduled for repeat labs on 01/23/18. Patient verbalized understanding and thankful for the call.  Notes recorded by Sueanne Margarita, MD on 12/04/2017 at 3:50 PM EDT LDL is not at goal - increase pravastatin to 80mg  daily and repeat FLP and ALT in 6 weeks

## 2017-12-05 NOTE — Progress Notes (Signed)
Cardiology Office Note:    Date:  12/06/2017   ID:  [name], DOB [birth year], MRN [id number]  PCP:  Sally Carol, MD  Cardiologist:  No primary care provider on file.    Referring MD: Sally Carol, MD   Chief Complaint  Patient presents with  . Coronary Artery Disease  . Hypertension  . Hyperlipidemia    History of Present Illness:    [name] is a 83 y.o. female with a hx of ASCAD  short s/p PCI of the LAD in 2010 and dyslipidemia.  She is here today for followup and is doing well.  She denies any chest pain or pressure, SOB, DOE, PND, orthopnea, palpitations or syncope. She has chronic dizziness from vertigo.  She occasionally has some mild LE edema by the end of the day which resolves by morning.  She is compliant with her meds and is tolerating meds with no SE.     Past Medical History:  Diagnosis Date  . Anxiety   . Arthritis   . Bladder cancer ([place]) 2012  . Breast cancer ([place])   . CAD (coronary artery disease) 2/10   S/P PCI (Stent) of LAD. Normal LVF-No angina  . Cancer of central portion of female breast, right 11/22/2015  . Cataract    left eye  . Colonic stricture ([place]) 2013   and colon adenoma  . Constipation    predominant IBS-miralax and citrucel daily-has used linzess in the past. Dr [name])  . Diverticulosis of colon (without mention of hemorrhage)   . External hemorrhoids without mention of complication   . Hyperlipidemia   . IBS (irritable bowel syndrome)   . Insomnia   . MSSA (methicillin susceptible Staphylococcus aureus) 2011   buttock abscess  . Osteopenia    mild  . Phlebitis   . PONV (postoperative nausea and vomiting)   . Squamous cell carcinoma, leg 2012   right, left  . Thyroid disease    hypothyroidism  . Vitamin D insufficiency    Dr. Joan Garcia    Past Surgical History:  Procedure Laterality Date  . Bladder cancer surgery  2012   Dr Zannie Garcia  . CORONARY ANGIOPLASTY WITH STENT PLACEMENT  10/2008  . HEMORRHOID  SURGERY    . KNEE SURGERY     Age 4-30  . Left elbow surgery x4    . RADIOACTIVE SEED GUIDED PARTIAL MASTECTOMY WITH AXILLARY SENTINEL LYMPH NODE BIOPSY Right 01/04/2016   Procedure: RADIOACTIVE SEED GUIDED PARTIAL MASTECTOMY WITH AXILLARY SENTINEL LYMPH NODE BIOPSY;  Surgeon: Sally Messing III, MD;  Location: [facility];  Service: General;  Laterality: Right;  . TONSILLECTOMY    . TOTAL KNEE ARTHROPLASTY  06/2000   Left    Current Medications: Current Meds  Medication Sig  . acetaminophen (TYLENOL) 500 MG tablet Take 500 mg by mouth daily as needed for mild pain or headache.  . ALPRAZolam (XANAX) 0.5 MG tablet Take 0.5 mg by mouth daily as needed for anxiety. Reported on 02/22/2016  . aspirin EC 81 MG tablet Take 81 mg by mouth daily.  . Coenzyme Q10 (COQ-10) 200 MG CAPS Take 1 capsule by mouth daily.  Marland Kitchen ezetimibe (ZETIA) 10 MG tablet Take 1 tablet (10 mg total) by mouth daily.  Marland Kitchen ibuprofen (ADVIL,MOTRIN) 200 MG tablet Take 200 mg by mouth daily as needed for moderate pain. Reported on 02/22/2016  . letrozole (FEMARA) 2.5 MG tablet Take 1 tablet (2.5 mg total) by mouth daily.  Marland Kitchen levothyroxine (SYNTHROID, LEVOTHROID) 50  MCG tablet Take 50 mcg by mouth daily.  . Magnesium Hydroxide (MAGNESIA PO) Take 1 tablet by mouth daily.  . pravastatin (PRAVACHOL) 80 MG tablet Take 1 tablet (80 mg total) by mouth every evening.  . psyllium (HYDROCIL/METAMUCIL) 95 % PACK Take 1 packet by mouth daily.  Marland Kitchen zolpidem (AMBIEN) 10 MG tablet Take 5 mg by mouth At bedtime as needed for sleep. Reported on 01/19/2016     Allergies:   Benadryl [diphenhydramine hcl]; Crestor [rosuvastatin]; Lipitor [atorvastatin]; Morphine and related; Tramadol; Adhesive [tape]; Cortisone; and Sulfonamide derivatives   Social History   Socioeconomic History  . Marital status: Married    Spouse name: Not on file  . Number of children: 2  . Years of education: Not on file  . Highest education level: Not on file  Occupational History  .  Occupation: Retired  Scientific laboratory technician  . Financial resource strain: Not on file  . Food insecurity:    Worry: Not on file    Inability: Not on file  . Transportation needs:    Medical: Not on file    Non-medical: Not on file  Tobacco Use  . Smoking status: Former Smoker    Types: Cigarettes    Last attempt to quit: 09/10/1984    Years since quitting: 33.2  . Smokeless tobacco: Never Used  . Tobacco comment: Quit tobacco 30 years ago.  Substance and Sexual Activity  . Alcohol use: Yes    Alcohol/week: 4.2 oz    Types: 7 Glasses of wine per week    Comment: wine  . Drug use: No  . Sexual activity: Never    Partners: Male    Birth control/protection: Post-menopausal    Comment: husband vasectomy  Lifestyle  . Physical activity:    Days per week: Not on file    Minutes per session: Not on file  . Stress: Not on file  Relationships  . Social connections:    Talks on phone: Not on file    Gets together: Not on file    Attends religious service: Not on file    Active member of club or organization: Not on file    Attends meetings of clubs or organizations: Not on file    Relationship status: Not on file  Other Topics Concern  . Not on file  Social History Narrative   Lives at home with husband.       Family History: The patient's family history includes Alzheimer's disease in her mother; Breast cancer in her unknown relative; Breast cancer (age of onset: 79) in her sister; Coronary artery disease (age of onset: 64) in her son; Coronary artery disease (age of onset: 50) in her paternal uncle; Diabetes in her maternal grandmother; Heart attack in her maternal grandfather; Heart attack (age of onset: 21) in her father; Heart disease (age of onset: 59) in her father; Hiatal hernia in her sister; Hypertension in her brother; Lung cancer in her brother; Stroke in her son; Ulcers in her mother. There is no history of Colon cancer, Esophageal cancer, Rectal cancer, or Stomach cancer.  ROS:    Please see the history of present illness.    ROS  All other systems reviewed and negative.   EKGs/Labs/Other Studies Reviewed:    The following studies were reviewed today: none  EKG:  EKG is  ordered today.  The ekg ordered today demonstrates NSR at 78bpm with no ST changes  Recent Labs: 10/23/2017: Hemoglobin 13.4; Platelets 268.0 12/04/2017: ALT 19  Recent Lipid Panel    Component Value Date/Time   CHOL 179 12/04/2017 0829   CHOL 164 06/21/2014 0829   TRIG 96 12/04/2017 0829   TRIG 115 06/21/2014 0829   HDL 83 12/04/2017 0829   HDL 78 06/21/2014 0829   CHOLHDL 2.2 12/04/2017 0829   CHOLHDL 1.8 01/25/2016 0826   VLDL 17 01/25/2016 0826   LDLCALC 77 12/04/2017 0829   LDLCALC 63 06/21/2014 0829    Physical Exam:    VS:  BP 136/80   Pulse 78   Ht 5' 2.5" (1.588 m)   Wt 165 lb 6.4 oz (75 kg)   BMI 29.77 kg/m     Wt Readings from Last 3 Encounters:  12/06/17 165 lb 6.4 oz (75 kg)  10/23/17 164 lb (74.4 kg)  03/07/17 164 lb (74.4 kg)     GEN:  Well nourished, well developed in no acute distress HEENT: Normal NECK: No JVD; No carotid bruits LYMPHATICS: No lymphadenopathy CARDIAC: RRR, no murmurs, rubs, gallops RESPIRATORY:  Clear to auscultation without rales, wheezing or rhonchi  ABDOMEN: Soft, non-tender, non-distended MUSCULOSKELETAL:  No edema; No deformity  SKIN: Warm and dry NEUROLOGIC:  Alert and oriented x 3 PSYCHIATRIC:  Normal affect   ASSESSMENT:    1. Atherosclerosis of native coronary artery of native heart with angina pectoris ([place])   2. Benign essential HTN   3. Pure hypercholesterolemia    PLAN:    In order of problems listed above:  1.  ASCAD - s/p PCI of the LAD in 2010.  She denies any anginal symptoms.  She will continue on aspirin 81 mg daily and statin therapy.  2.  HTN - blood pressures well controlled on exam today.  She is not on antihypertensive therapy at this time.  3.  Hyperlipidemia -LDL goal is less than 70.  Her  last LDL was 78 on 04/30/2017.  She will continue on  Zetia 10 mg daily.  Her Pravastatin was just increased to 80mg  daily and will get a repeat FLP and ALT in 6 weeks.    Medication Adjustments/Labs and Tests Ordered: Current medicines are reviewed at length with the patient today.  Concerns regarding medicines are outlined above.  No orders of the defined types were placed in this encounter.  No orders of the defined types were placed in this encounter.   Signed, Fransico Him, MD  12/06/2017 9:15 AM    [name]

## 2017-12-06 ENCOUNTER — Encounter: Payer: Self-pay | Admitting: Cardiology

## 2017-12-06 ENCOUNTER — Ambulatory Visit: Payer: Medicare HMO | Admitting: Cardiology

## 2017-12-06 VITALS — BP 136/80 | HR 78 | Ht 62.5 in | Wt 165.4 lb

## 2017-12-06 DIAGNOSIS — I10 Essential (primary) hypertension: Secondary | ICD-10-CM | POA: Diagnosis not present

## 2017-12-06 DIAGNOSIS — E7800 Pure hypercholesterolemia, unspecified: Secondary | ICD-10-CM

## 2017-12-06 DIAGNOSIS — I25119 Atherosclerotic heart disease of native coronary artery with unspecified angina pectoris: Secondary | ICD-10-CM

## 2017-12-06 NOTE — Patient Instructions (Signed)
Medication Instructions:  Your physician recommends that you continue on your current medications as directed. Please refer to the Current Medication list given to you today.  If you need a refill on your cardiac medications, please contact your pharmacy first.  Labwork: None ordered   Testing/Procedures: None ordered   Follow-Up: Your physician wants you to follow-up in: 1 year with Dr. Turner. You will receive a reminder letter in the mail two months in advance. If you don't receive a letter, please call our office to schedule the follow-up appointment.  Any Other Special Instructions Will Be Listed Below (If Applicable).   Thank you for choosing [facility]    Rena Sophiana Milanese, RN  [phone]  If you need a refill on your cardiac medications before your next appointment, please call your pharmacy.   

## 2017-12-24 DIAGNOSIS — J069 Acute upper respiratory infection, unspecified: Secondary | ICD-10-CM | POA: Insufficient documentation

## 2017-12-24 DIAGNOSIS — H6122 Impacted cerumen, left ear: Secondary | ICD-10-CM | POA: Insufficient documentation

## 2017-12-30 DIAGNOSIS — J069 Acute upper respiratory infection, unspecified: Secondary | ICD-10-CM | POA: Diagnosis not present

## 2017-12-30 DIAGNOSIS — Z20828 Contact with and (suspected) exposure to other viral communicable diseases: Secondary | ICD-10-CM | POA: Diagnosis not present

## 2018-01-06 ENCOUNTER — Other Ambulatory Visit: Payer: Self-pay | Admitting: Cardiology

## 2018-01-22 ENCOUNTER — Other Ambulatory Visit: Payer: Medicare HMO | Admitting: *Deleted

## 2018-01-22 DIAGNOSIS — E785 Hyperlipidemia, unspecified: Secondary | ICD-10-CM | POA: Diagnosis not present

## 2018-01-22 LAB — LIPID PANEL
CHOLESTEROL TOTAL: 178 mg/dL (ref 100–199)
Chol/HDL Ratio: 2 ratio (ref 0.0–4.4)
HDL: 89 mg/dL (ref >39)
LDL Calculated: 69 mg/dL (ref 0–99)
Triglycerides: 102 mg/dL (ref 0–149)
VLDL CHOLESTEROL CAL: 20 mg/dL (ref 5–40)

## 2018-01-22 LAB — ALT: ALT: 17 IU/L (ref 0–32)

## 2018-01-23 ENCOUNTER — Other Ambulatory Visit: Payer: Medicare HMO

## 2018-02-17 ENCOUNTER — Telehealth: Payer: Self-pay | Admitting: Hematology and Oncology

## 2018-02-17 NOTE — Progress Notes (Signed)
Patient Care Team: Seward Carol, MD as PCP - General (Internal Medicine) Sylvan Cheese, NP as Nurse Practitioner (Hematology and Oncology)  DIAGNOSIS:  Encounter Diagnosis  Name Primary?  . Malignant neoplasm of central portion of right breast in female, estrogen receptor positive ([place])     SUMMARY OF ONCOLOGIC HISTORY:   Malignant neoplasm of central portion of right female breast ([place])   11/08/2015 Mammogram    Right breast 3.2 cm group of pleomorphic calcifications, breast density B, T2 N0 stage II a clinical stage (unclear how much of the calcification is invasive ductal carcinoma versus DCIS)      11/14/2015 Initial Diagnosis    Right breast core biopsy: Small focus of Invasive ductal carcinoma with high-grade DCIS with comedonecrosis, ER 100%, PR 5%, Ki-67 20%, HER-2 negative      01/04/2016 Surgery    Right lumpectomy: Microscopic focus of invasive ductal carcinoma less than 0.1 cm with high-grade DCIS, comedo necrosis, less than 0.1 cm the superior margin, 0/9 LN negative, grade 2, ER 100%, PR 5%, HER-2 negative ratio 1.72, Ki-67 20%, T1 N0 stage IA       02/22/2016 -  Anti-estrogen oral therapy    Anastrozole 1 mg daily switched to letrozole 2.5 mg daily 05/24/2016 due to dizziness and severe hot flashes       CHIEF COMPLIANT: Follow-up on letrozole therapy  INTERVAL HISTORY: [name] is a 83 year old with above-mentioned history of right breast cancer treated with lumpectomy and is currently on oral antiestrogen therapy with letrozole.  She appears to be tolerating it extremely well.  She does not have any major symptoms.  We switched her from anastrozole to letrozole because of dizziness and hot flashes.  On letrozole she appears to be doing extremely well.  She has no hot flashes or myalgias.  She complains of mild lymphedema under the arm.  Denies any lumps or nodules.  Mammograms were normal.  REVIEW OF SYSTEMS:   Constitutional: Denies fevers,  chills or abnormal weight loss Eyes: Denies blurriness of vision Ears, nose, mouth, throat, and face: Denies mucositis or sore throat Respiratory: Denies cough, dyspnea or wheezes Cardiovascular: Denies palpitation, chest discomfort Gastrointestinal:  Denies nausea, heartburn or change in bowel habits Skin: Denies abnormal skin rashes Lymphatics: Denies new lymphadenopathy or easy bruising Neurological:Denies numbness, tingling or new weaknesses Behavioral/Psych: Mood is stable, no new changes  Extremities: No lower extremity edema Breast:  denies any pain or lumps or nodules in either breasts All other systems were reviewed with the patient and are negative.  I have reviewed the past medical history, past surgical history, social history and family history with the patient and they are unchanged from previous note.  ALLERGIES:  is allergic to benadryl [diphenhydramine hcl]; crestor [rosuvastatin]; lipitor [atorvastatin]; morphine and related; tramadol; adhesive [tape]; cortisone; and sulfonamide derivatives.  MEDICATIONS:  Current Outpatient Medications  Medication Sig Dispense Refill  . acetaminophen (TYLENOL) 500 MG tablet Take 500 mg by mouth daily as needed for mild pain or headache.    . ALPRAZolam (XANAX) 0.5 MG tablet Take 0.5 mg by mouth daily as needed for anxiety. Reported on 02/22/2016    . aspirin EC 81 MG tablet Take 81 mg by mouth daily.    . Coenzyme Q10 (COQ-10) 200 MG CAPS Take 1 capsule by mouth daily.    Marland Kitchen ezetimibe (ZETIA) 10 MG tablet TAKE ONE TABLET BY MOUTH ONE TIME DAILY  90 tablet 3  . ibuprofen (ADVIL,MOTRIN) 200 MG tablet Take  200 mg by mouth daily as needed for moderate pain. Reported on 02/22/2016    . letrozole (FEMARA) 2.5 MG tablet Take 1 tablet (2.5 mg total) by mouth daily. 90 tablet 3  . levothyroxine (SYNTHROID, LEVOTHROID) 50 MCG tablet Take 50 mcg by mouth daily.    . Magnesium Hydroxide (MAGNESIA PO) Take 1 tablet by mouth daily.    . pravastatin  (PRAVACHOL) 80 MG tablet Take 1 tablet (80 mg total) by mouth every evening. 90 tablet 3  . psyllium (HYDROCIL/METAMUCIL) 95 % PACK Take 1 packet by mouth daily.    Marland Kitchen zolpidem (AMBIEN) 10 MG tablet Take 5 mg by mouth At bedtime as needed for sleep. Reported on 01/19/2016     No current facility-administered medications for this visit.     PHYSICAL EXAMINATION: ECOG PERFORMANCE STATUS: 1 - Symptomatic but completely ambulatory  Vitals:   02/18/18 1021  BP: (!) 168/69  Pulse: 89  Resp: 17  Temp: 97.7 F (36.5 C)  SpO2: 100%   Filed Weights   02/18/18 1021  Weight: 165 lb 9.6 oz (75.1 kg)    GENERAL:alert, no distress and comfortable SKIN: skin color, texture, turgor are normal, no rashes or significant lesions EYES: normal, Conjunctiva are pink and non-injected, sclera clear OROPHARYNX:no exudate, no erythema and lips, buccal mucosa, and tongue normal  NECK: supple, thyroid normal size, non-tender, without nodularity LYMPH:  no palpable lymphadenopathy in the cervical, axillary or inguinal LUNGS: clear to auscultation and percussion with normal breathing effort HEART: regular rate & rhythm and no murmurs and no lower extremity edema ABDOMEN:abdomen soft, non-tender and normal bowel sounds MUSCULOSKELETAL:no cyanosis of digits and no clubbing  NEURO: alert & oriented x 3 with fluent speech, no focal motor/sensory deficits EXTREMITIES: No lower extremity edema BREAST: No palpable masses or nodules in either right or left breasts. No palpable axillary supraclavicular or infraclavicular adenopathy no breast tenderness or nipple discharge. (exam performed in the presence of a chaperone)  LABORATORY DATA:  I have reviewed the data as listed CMP Latest Ref Rng & Units 01/22/2018 12/04/2017 12/03/2016  Glucose 70 - 99 mg/dL - - -  BUN 6 - 23 mg/dL - - -  Creatinine 0.40 - 1.20 mg/dL - - -  Sodium 135 - 145 mEq/L - - -  Potassium 3.5 - 5.1 mEq/L - - -  Chloride 96 - 112 mEq/L - - -    CO2 19 - 32 mEq/L - - -  Calcium 8.4 - 10.5 mg/dL - - -  Total Protein 6.1 - 8.1 g/dL - - -  Total Bilirubin 0.2 - 1.2 mg/dL - - -  Alkaline Phos 33 - 130 U/L - - -  AST 10 - 35 U/L - - -  ALT 0 - 32 IU/L 17 19 19     Lab Results  Component Value Date   WBC 6.1 10/23/2017   HGB 13.4 10/23/2017   HCT 39.4 10/23/2017   MCV 94.6 10/23/2017   PLT 268.0 10/23/2017   NEUTROABS 3.9 10/23/2017    ASSESSMENT & PLAN:  Malignant neoplasm of central portion of right female breast ([facility]) Right lumpectomy 01/04/2016: Microscopic focus of invasive ductal carcinoma less than 0.1 cm with high-grade DCIS, comedo necrosis, less than 0.1 cm the superior margin, 0/9 LN negative, grade 2, ER 100%, PR 5%, HER-2 negative ratio 1.72, Ki-67 20%, T1 N0 stage IA  Patient did not get adjuvant radiation therapy because of minimal microscopic invasive cancer as well as very sensitive skin  Current treatment: Anastrozole 1 mg daily started June 2017, changedto letrozole 2.5 mg daily 05/24/2016 due to dizziness and hot flashes  Letrozole toxicities: Denies any hot flashes or myalgias. I instructed her to obtain a bone density test when she goes to see her primary care physician in August.  Surveillance: 1. Mammograms done at [facility] 12/2017 2. breast exam 02/18/2018: Benign Return to clinic in 1 year for follow-up.    No orders of the defined types were placed in this encounter.  The patient has a good understanding of the overall plan. she agrees with it. she will call with any problems that may develop before the next visit here.   Harriette Ohara, MD 02/18/18

## 2018-02-17 NOTE — Telephone Encounter (Signed)
tried to call regarding voicemail °

## 2018-02-17 NOTE — Assessment & Plan Note (Signed)
Right lumpectomy 01/04/2016: Microscopic focus of invasive ductal carcinoma less than 0.1 cm with high-grade DCIS, comedo necrosis, less than 0.1 cm the superior margin, 0/9 LN negative, grade 2, ER 100%, PR 5%, HER-2 negative ratio 1.72, Ki-67 20%, T1 N0 stage IA  Patient did not get adjuvant radiation therapy because of minimal microscopic invasive cancer as well as very sensitive skin  Current treatment: Anastrozole 1 mg daily started June 2017, changedto letrozole 2.5 mg daily 05/24/2016 due to dizziness and hot flashes  Letrozole toxicities: Denies any hot flashes or myalgias.  Surveillance: 1. Mammograms done at [facility] 12/2017 2. breast exam 02/18/2018: Benign Return to clinic in 1 year for follow-up.

## 2018-02-18 ENCOUNTER — Telehealth: Payer: Self-pay | Admitting: Hematology and Oncology

## 2018-02-18 ENCOUNTER — Inpatient Hospital Stay: Payer: Medicare HMO | Attending: Hematology and Oncology | Admitting: Hematology and Oncology

## 2018-02-18 DIAGNOSIS — Z7982 Long term (current) use of aspirin: Secondary | ICD-10-CM

## 2018-02-18 DIAGNOSIS — Z79811 Long term (current) use of aromatase inhibitors: Secondary | ICD-10-CM

## 2018-02-18 DIAGNOSIS — C50111 Malignant neoplasm of central portion of right female breast: Secondary | ICD-10-CM | POA: Insufficient documentation

## 2018-02-18 DIAGNOSIS — Z79899 Other long term (current) drug therapy: Secondary | ICD-10-CM | POA: Diagnosis not present

## 2018-02-18 DIAGNOSIS — Z170 Estrogen receptor positive status [ER+]: Secondary | ICD-10-CM | POA: Insufficient documentation

## 2018-02-18 DIAGNOSIS — I890 Lymphedema, not elsewhere classified: Secondary | ICD-10-CM | POA: Insufficient documentation

## 2018-02-18 MED ORDER — LETROZOLE 2.5 MG PO TABS: 2.5000 mg | ORAL_TABLET | Freq: Every day | ORAL | 3 refills | Status: DC

## 2018-02-18 NOTE — Telephone Encounter (Signed)
Gave avs and calendar ° °

## 2018-04-03 ENCOUNTER — Ambulatory Visit (INDEPENDENT_AMBULATORY_CARE_PROVIDER_SITE_OTHER): Payer: Medicare HMO | Admitting: Certified Nurse Midwife

## 2018-04-03 ENCOUNTER — Encounter: Payer: Self-pay | Admitting: Certified Nurse Midwife

## 2018-04-03 ENCOUNTER — Other Ambulatory Visit: Payer: Self-pay

## 2018-04-03 VITALS — BP 138/64 | HR 68 | Resp 16 | Ht 62.25 in | Wt 166.0 lb

## 2018-04-03 DIAGNOSIS — N951 Menopausal and female climacteric states: Secondary | ICD-10-CM | POA: Diagnosis not present

## 2018-04-03 DIAGNOSIS — Z780 Asymptomatic menopausal state: Secondary | ICD-10-CM

## 2018-04-03 DIAGNOSIS — Z659 Problem related to unspecified psychosocial circumstances: Secondary | ICD-10-CM

## 2018-04-03 DIAGNOSIS — Z01419 Encounter for gynecological examination (general) (routine) without abnormal findings: Secondary | ICD-10-CM | POA: Diagnosis not present

## 2018-04-03 DIAGNOSIS — Z853 Personal history of malignant neoplasm of breast: Secondary | ICD-10-CM

## 2018-04-03 NOTE — Progress Notes (Signed)
83 y.o. G59P2002 Married  Caucasian Fe here for annual exam. Menopausal no HRT. Denies vaginal bleeding. Some vaginal dryness. Having some balance issues and taking balance class, interested in Physical Therapy. Still living at [facility] and spouse recovery is doing well since stroke. Sees PCP Dr. Delfina Redwood yearly for labs, aex, cholesterol, synthroid, Ambien management, no changes per patient. Continues with oncology follow up with breast cancer, continues on Letrazole. Still driving without issues. No health concerns today.    No LMP recorded. Patient is postmenopausal.          Sexually active: No.  The current method of family planning is vasectomy.    Exercising: Yes.    balance class Smoker:  no  Health Maintenance: Pap:  02-22-16 neg History of Abnormal Pap: no MMG:  11-15-16 category b density birads 2:neg 2019, waiting on fax Self Breast exams: occ Colonoscopy:  2013 BMD:   2011, scheduled 9/19 TDaP:  2013, 2014 Shingles: not done Pneumonia: 2016 Hep C and HIV: not done Labs: no   reports that she quit smoking about 33 years ago. Her smoking use included cigarettes. She has never used smokeless tobacco. She reports that she drinks about 4.2 oz of alcohol per week. She reports that she does not use drugs.  Past Medical History:  Diagnosis Date  . Anxiety   . Arthritis   . Bladder cancer ([place]) 2012  . Breast cancer ([place])   . CAD (coronary artery disease) 2/10   S/P PCI (Stent) of LAD. Normal LVF-No angina  . Cancer of central portion of female breast, right 11/22/2015  . Cataract    left eye  . Colonic stricture ([place]) 2013   and colon adenoma  . Constipation    predominant IBS-miralax and citrucel daily-has used linzess in the past. Dr Macky Lower)  . Diverticulosis of colon (without mention of hemorrhage)   . External hemorrhoids without mention of complication   . Hyperlipidemia   . IBS (irritable bowel syndrome)   . Insomnia   . MSSA (methicillin susceptible  Staphylococcus aureus) 2011   buttock abscess  . Osteopenia    mild  . Phlebitis   . PONV (postoperative nausea and vomiting)   . Squamous cell carcinoma, leg 2012   right, left  . Thyroid disease    hypothyroidism  . Vitamin D insufficiency    Dr. Joan Flores    Past Surgical History:  Procedure Laterality Date  . Bladder cancer surgery  2012   Dr Zannie Cove  . CORONARY ANGIOPLASTY WITH STENT PLACEMENT  10/2008  . HEMORRHOID SURGERY    . KNEE SURGERY     Age 19-30  . Left elbow surgery x4    . RADIOACTIVE SEED GUIDED PARTIAL MASTECTOMY WITH AXILLARY SENTINEL LYMPH NODE BIOPSY Right 01/04/2016   Procedure: RADIOACTIVE SEED GUIDED PARTIAL MASTECTOMY WITH AXILLARY SENTINEL LYMPH NODE BIOPSY;  Surgeon: Autumn Messing III, MD;  Location: [facility];  Service: General;  Laterality: Right;  . TONSILLECTOMY    . TOTAL KNEE ARTHROPLASTY  06/2000   Left    Current Outpatient Medications  Medication Sig Dispense Refill  . acetaminophen (TYLENOL) 500 MG tablet Take 500 mg by mouth daily as needed for mild pain or headache.    . ALPRAZolam (XANAX) 0.5 MG tablet Take 0.5 mg by mouth daily as needed for anxiety. Reported on 02/22/2016    . aspirin EC 81 MG tablet Take 81 mg by mouth daily.    Marland Kitchen ezetimibe (ZETIA) 10 MG tablet TAKE ONE TABLET  BY MOUTH ONE TIME DAILY  90 tablet 3  . ibuprofen (ADVIL,MOTRIN) 200 MG tablet Take 200 mg by mouth daily as needed for moderate pain. Reported on 02/22/2016    . letrozole (FEMARA) 2.5 MG tablet Take 1 tablet (2.5 mg total) by mouth daily. 90 tablet 3  . levothyroxine (SYNTHROID, LEVOTHROID) 50 MCG tablet Take 50 mcg by mouth daily.    . Magnesium Hydroxide (MAGNESIA PO) Take 1 tablet by mouth daily.    . pravastatin (PRAVACHOL) 80 MG tablet Take 1 tablet (80 mg total) by mouth every evening. 90 tablet 3  . psyllium (HYDROCIL/METAMUCIL) 95 % PACK Take 1 packet by mouth daily.    Marland Kitchen zolpidem (AMBIEN) 10 MG tablet Take 5 mg by mouth At bedtime as needed for sleep. Reported  on 01/19/2016     No current facility-administered medications for this visit.     Family History  Problem Relation Age of Onset  . Heart disease Father 58       Died of MI  . Heart attack Father 8  . Hiatal hernia Sister   . Breast cancer Sister 71  . Ulcers Mother        peptic  . Alzheimer's disease Mother   . Hypertension Brother   . Lung cancer Brother   . Breast cancer Unknown        aunt  . Coronary artery disease Son 2       MI  . Stroke Son   . Coronary artery disease Paternal Uncle 21       Died with CAD  . Diabetes Maternal Grandmother   . Heart attack Maternal Grandfather        70's  . Colon cancer Neg Hx   . Esophageal cancer Neg Hx   . Rectal cancer Neg Hx   . Stomach cancer Neg Hx     ROS:  Pertinent items are noted in HPI.  Otherwise, a comprehensive ROS was negative.  Exam:   BP 138/64   Pulse 68   Resp 16   Ht 5' 2.25" (1.581 m)   Wt 166 lb (75.3 kg)   BMI 30.12 kg/m  Height: 5' 2.25" (158.1 cm) Ht Readings from Last 3 Encounters:  04/03/18 5' 2.25" (1.581 m)  02/18/18 5' 2.5" (1.588 m)  12/06/17 5' 2.5" (1.588 m)    General appearance: alert, cooperative and appears stated age Head: Normocephalic, without obvious abnormality, atraumatic Neck: no adenopathy, supple, symmetrical, trachea midline and thyroid normal to inspection and palpation Lungs: clear to auscultation bilaterally Breasts: normal appearance, no masses or tenderness, No nipple retraction or dimpling, No nipple discharge or bleeding, No axillary or supraclavicular adenopathy Heart: regular rate and rhythm Abdomen: soft, non-tender; no masses,  no organomegaly Extremities: extremities normal, atraumatic, no cyanosis or edema Skin: Skin color, texture, turgor normal. No rashes or lesions Lymph nodes: Cervical, supraclavicular, and axillary nodes normal. No abnormal inguinal nodes palpated Neurologic: Grossly normal   Pelvic: External genitalia:  no lesions               Urethra:  normal appearing urethra with no masses, tenderness or lesions              Bartholin's and Skene's: normal                 Vagina: normal appearing vagina with normal color and discharge, no lesions              Cervix: multiparous appearance, no  cervical motion tenderness and no lesions              Pap taken: No. 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  P:   Reviewed health and wellness pertinent to exam  Discussed vaginal dryness finding and coconut oil use at hs for comfort. Patient has this and will start using. Will call if no change  Stressed SBE, mammogram yearly and follow up with oncology as indicated.  Discuss PT help also with this concern.  Continue to seek family assistance as needed.  Continue follow up as indicated with PCP.  Pap smear: no   counseled on breast self exam, mammography screening, feminine hygiene, adequate intake of calcium and vitamin D, diet and exercise, Kegel's exercises  return annually or prn  An After Visit Summary was printed and given to the patient.

## 2018-04-03 NOTE — Patient Instructions (Signed)
EXERCISE AND DIET:  We recommended that you start or continue a regular exercise program for good health. Regular exercise means any activity that makes your heart beat faster and makes you sweat.  We recommend exercising at least 30 minutes per day at least 3 days a week, preferably 4 or 5.  We also recommend a diet low in fat and sugar.  Inactivity, poor dietary choices and obesity can cause diabetes, heart attack, stroke, and kidney damage, among others.    ALCOHOL AND SMOKING:  Women should limit their alcohol intake to no more than 7 drinks/beers/glasses of wine (combined, not each!) per week. Moderation of alcohol intake to this level decreases your risk of breast cancer and liver damage. And of course, no recreational drugs are part of a healthy lifestyle.  And absolutely no smoking or even second hand smoke. Most people know smoking can cause heart and lung diseases, but did you know it also contributes to weakening of your bones? Aging of your skin?  Yellowing of your teeth and nails?  CALCIUM AND VITAMIN D:  Adequate intake of calcium and Vitamin D are recommended.  The recommendations for exact amounts of these supplements seem to change often, but generally speaking 600 mg of calcium (either carbonate or citrate) and 800 units of Vitamin D per day seems prudent. Certain women may benefit from higher intake of Vitamin D.  If you are among these women, your doctor will have told you during your visit.    PAP SMEARS:  Pap smears, to check for cervical cancer or precancers,  have traditionally been done yearly, although recent scientific advances have shown that most women can have pap smears less often.  However, every woman still should have a physical exam from her gynecologist every year. It will include a breast check, inspection of the vulva and vagina to check for abnormal growths or skin changes, a visual exam of the cervix, and then an exam to evaluate the size and shape of the uterus and  ovaries.  And after 81 years of age, a rectal exam is indicated to check for rectal cancers. We will also provide age appropriate advice regarding health maintenance, like when you should have certain vaccines, screening for sexually transmitted diseases, bone density testing, colonoscopy, mammograms, etc.   MAMMOGRAMS:  All women over 40 years old should have a yearly mammogram. Many facilities now offer a "3D" mammogram, which may cost around $50 extra out of pocket. If possible,  we recommend you accept the option to have the 3D mammogram performed.  It both reduces the number of women who will be called back for extra views which then turn out to be normal, and it is better than the routine mammogram at detecting truly abnormal areas.    COLONOSCOPY:  Colonoscopy to screen for colon cancer is recommended for all women at age 50.  We know, you hate the idea of the prep.  We agree, BUT, having colon cancer and not knowing it is worse!!  Colon cancer so often starts as a polyp that can be seen and removed at colonscopy, which can quite literally save your life!  And if your first colonoscopy is normal and you have no family history of colon cancer, most women don't have to have it again for 10 years.  Once every ten years, you can do something that may end up saving your life, right?  We will be happy to help you get it scheduled when you are ready.    Be sure to check your insurance coverage so you understand how much it will cost.  It may be covered as a preventative service at no cost, but you should check your particular policy.      Atrophic Vaginitis Atrophic vaginitis is when the tissues that line the vagina become dry and thin. This is caused by a drop in estrogen. Estrogen helps:  To keep the vagina moist.  To make a clear fluid that helps: ? To lubricate the vagina for sex. ? To protect the vagina from infection.  If the lining of the vagina is dry and thin, it may:  Make sex painful. It  may also cause bleeding.  Cause a feeling of: ? Burning. ? Irritation. ? Itchiness.  Make an exam of your vagina painful. It may also cause bleeding.  Make you lose interest in sex.  Cause a burning feeling when you pee.  Make your vaginal fluid (discharge) brown or yellow.  For some women, there are no symptoms. This condition is most common in women who do not get their regular menstrual periods anymore (menopause). This often starts when a woman is 45-55 years old. Follow these instructions at home:  Take medicines only as told by your doctor. Do not use any herbal or alternative medicines unless your doctor says it is okay.  Use over-the-counter products for dryness only as told by your doctor. These include: ? Creams. ? Lubricants. ? Moisturizers.  Do not douche.  Do not use products that can make your vagina dry. These include: ? Scented feminine sprays. ? Scented tampons. ? Scented soaps.  If it hurts to have sex, tell your sexual partner. Contact a doctor if:  Your discharge looks different than normal.  Your vagina has an unusual smell.  You have new symptoms.  Your symptoms do not get better with treatment.  Your symptoms get worse. This information is not intended to replace advice given to you by your health care provider. Make sure you discuss any questions you have with your health care provider. Document Released: 02/13/2008 Document Revised: 02/02/2016 Document Reviewed: 08/18/2014 Elsevier Interactive Patient Education  2018 [employer]  

## 2018-04-10 DIAGNOSIS — R69 Illness, unspecified: Secondary | ICD-10-CM | POA: Diagnosis not present

## 2018-04-24 ENCOUNTER — Encounter: Payer: Self-pay | Admitting: Certified Nurse Midwife

## 2018-05-07 DIAGNOSIS — H10413 Chronic giant papillary conjunctivitis, bilateral: Secondary | ICD-10-CM | POA: Diagnosis not present

## 2018-05-07 DIAGNOSIS — H26492 Other secondary cataract, left eye: Secondary | ICD-10-CM | POA: Diagnosis not present

## 2018-05-07 DIAGNOSIS — Z961 Presence of intraocular lens: Secondary | ICD-10-CM | POA: Diagnosis not present

## 2018-05-07 DIAGNOSIS — H02834 Dermatochalasis of left upper eyelid: Secondary | ICD-10-CM | POA: Diagnosis not present

## 2018-05-07 DIAGNOSIS — H04123 Dry eye syndrome of bilateral lacrimal glands: Secondary | ICD-10-CM | POA: Diagnosis not present

## 2018-05-07 DIAGNOSIS — H02831 Dermatochalasis of right upper eyelid: Secondary | ICD-10-CM | POA: Diagnosis not present

## 2018-05-07 DIAGNOSIS — H43811 Vitreous degeneration, right eye: Secondary | ICD-10-CM | POA: Diagnosis not present

## 2018-05-22 DIAGNOSIS — G4700 Insomnia, unspecified: Secondary | ICD-10-CM | POA: Diagnosis not present

## 2018-05-22 DIAGNOSIS — E039 Hypothyroidism, unspecified: Secondary | ICD-10-CM | POA: Diagnosis not present

## 2018-05-22 DIAGNOSIS — R269 Unspecified abnormalities of gait and mobility: Secondary | ICD-10-CM | POA: Diagnosis not present

## 2018-05-22 DIAGNOSIS — R69 Illness, unspecified: Secondary | ICD-10-CM | POA: Diagnosis not present

## 2018-05-22 DIAGNOSIS — E7800 Pure hypercholesterolemia, unspecified: Secondary | ICD-10-CM | POA: Diagnosis not present

## 2018-05-22 DIAGNOSIS — Z1389 Encounter for screening for other disorder: Secondary | ICD-10-CM | POA: Diagnosis not present

## 2018-05-22 DIAGNOSIS — K589 Irritable bowel syndrome without diarrhea: Secondary | ICD-10-CM | POA: Diagnosis not present

## 2018-05-22 DIAGNOSIS — C679 Malignant neoplasm of bladder, unspecified: Secondary | ICD-10-CM | POA: Diagnosis not present

## 2018-05-22 DIAGNOSIS — I25119 Atherosclerotic heart disease of native coronary artery with unspecified angina pectoris: Secondary | ICD-10-CM | POA: Diagnosis not present

## 2018-05-22 DIAGNOSIS — Z0000 Encounter for general adult medical examination without abnormal findings: Secondary | ICD-10-CM | POA: Diagnosis not present

## 2018-05-29 DIAGNOSIS — D692 Other nonthrombocytopenic purpura: Secondary | ICD-10-CM | POA: Diagnosis not present

## 2018-05-29 DIAGNOSIS — L821 Other seborrheic keratosis: Secondary | ICD-10-CM | POA: Diagnosis not present

## 2018-05-29 DIAGNOSIS — D2261 Melanocytic nevi of right upper limb, including shoulder: Secondary | ICD-10-CM | POA: Diagnosis not present

## 2018-05-29 DIAGNOSIS — D485 Neoplasm of uncertain behavior of skin: Secondary | ICD-10-CM | POA: Diagnosis not present

## 2018-05-29 DIAGNOSIS — D225 Melanocytic nevi of trunk: Secondary | ICD-10-CM | POA: Diagnosis not present

## 2018-05-29 DIAGNOSIS — Z85828 Personal history of other malignant neoplasm of skin: Secondary | ICD-10-CM | POA: Diagnosis not present

## 2018-05-29 DIAGNOSIS — D1801 Hemangioma of skin and subcutaneous tissue: Secondary | ICD-10-CM | POA: Diagnosis not present

## 2018-05-29 DIAGNOSIS — L570 Actinic keratosis: Secondary | ICD-10-CM | POA: Diagnosis not present

## 2018-06-04 DIAGNOSIS — R2681 Unsteadiness on feet: Secondary | ICD-10-CM | POA: Diagnosis not present

## 2018-06-04 DIAGNOSIS — M25561 Pain in right knee: Secondary | ICD-10-CM | POA: Diagnosis not present

## 2018-06-04 DIAGNOSIS — M1711 Unilateral primary osteoarthritis, right knee: Secondary | ICD-10-CM | POA: Diagnosis not present

## 2018-06-05 DIAGNOSIS — M1711 Unilateral primary osteoarthritis, right knee: Secondary | ICD-10-CM | POA: Diagnosis not present

## 2018-06-05 DIAGNOSIS — M25561 Pain in right knee: Secondary | ICD-10-CM | POA: Diagnosis not present

## 2018-06-05 DIAGNOSIS — R2681 Unsteadiness on feet: Secondary | ICD-10-CM | POA: Diagnosis not present

## 2018-06-06 DIAGNOSIS — G609 Hereditary and idiopathic neuropathy, unspecified: Secondary | ICD-10-CM | POA: Diagnosis not present

## 2018-06-06 DIAGNOSIS — M25561 Pain in right knee: Secondary | ICD-10-CM | POA: Diagnosis not present

## 2018-06-06 DIAGNOSIS — M1711 Unilateral primary osteoarthritis, right knee: Secondary | ICD-10-CM | POA: Diagnosis not present

## 2018-06-10 DIAGNOSIS — R2681 Unsteadiness on feet: Secondary | ICD-10-CM | POA: Diagnosis not present

## 2018-06-10 DIAGNOSIS — M1711 Unilateral primary osteoarthritis, right knee: Secondary | ICD-10-CM | POA: Diagnosis not present

## 2018-06-10 DIAGNOSIS — M25561 Pain in right knee: Secondary | ICD-10-CM | POA: Diagnosis not present

## 2018-06-12 DIAGNOSIS — M1711 Unilateral primary osteoarthritis, right knee: Secondary | ICD-10-CM | POA: Diagnosis not present

## 2018-06-12 DIAGNOSIS — R2681 Unsteadiness on feet: Secondary | ICD-10-CM | POA: Diagnosis not present

## 2018-06-12 DIAGNOSIS — M25561 Pain in right knee: Secondary | ICD-10-CM | POA: Diagnosis not present

## 2018-06-13 DIAGNOSIS — M1711 Unilateral primary osteoarthritis, right knee: Secondary | ICD-10-CM | POA: Diagnosis not present

## 2018-06-13 DIAGNOSIS — M25561 Pain in right knee: Secondary | ICD-10-CM | POA: Diagnosis not present

## 2018-06-13 DIAGNOSIS — R2681 Unsteadiness on feet: Secondary | ICD-10-CM | POA: Diagnosis not present

## 2018-06-16 DIAGNOSIS — R2681 Unsteadiness on feet: Secondary | ICD-10-CM | POA: Diagnosis not present

## 2018-06-16 DIAGNOSIS — M25561 Pain in right knee: Secondary | ICD-10-CM | POA: Diagnosis not present

## 2018-06-16 DIAGNOSIS — M1711 Unilateral primary osteoarthritis, right knee: Secondary | ICD-10-CM | POA: Diagnosis not present

## 2018-06-17 DIAGNOSIS — M8588 Other specified disorders of bone density and structure, other site: Secondary | ICD-10-CM | POA: Diagnosis not present

## 2018-06-18 DIAGNOSIS — M1711 Unilateral primary osteoarthritis, right knee: Secondary | ICD-10-CM | POA: Diagnosis not present

## 2018-06-18 DIAGNOSIS — M25561 Pain in right knee: Secondary | ICD-10-CM | POA: Diagnosis not present

## 2018-06-18 DIAGNOSIS — R2681 Unsteadiness on feet: Secondary | ICD-10-CM | POA: Diagnosis not present

## 2018-06-20 DIAGNOSIS — M1711 Unilateral primary osteoarthritis, right knee: Secondary | ICD-10-CM | POA: Diagnosis not present

## 2018-06-20 DIAGNOSIS — M25561 Pain in right knee: Secondary | ICD-10-CM | POA: Diagnosis not present

## 2018-06-20 DIAGNOSIS — R2681 Unsteadiness on feet: Secondary | ICD-10-CM | POA: Diagnosis not present

## 2018-06-30 DIAGNOSIS — M1711 Unilateral primary osteoarthritis, right knee: Secondary | ICD-10-CM | POA: Diagnosis not present

## 2018-06-30 DIAGNOSIS — R2681 Unsteadiness on feet: Secondary | ICD-10-CM | POA: Diagnosis not present

## 2018-06-30 DIAGNOSIS — M25561 Pain in right knee: Secondary | ICD-10-CM | POA: Diagnosis not present

## 2018-07-02 DIAGNOSIS — R2681 Unsteadiness on feet: Secondary | ICD-10-CM | POA: Diagnosis not present

## 2018-07-02 DIAGNOSIS — M25561 Pain in right knee: Secondary | ICD-10-CM | POA: Diagnosis not present

## 2018-07-02 DIAGNOSIS — M1711 Unilateral primary osteoarthritis, right knee: Secondary | ICD-10-CM | POA: Diagnosis not present

## 2018-07-04 DIAGNOSIS — M1711 Unilateral primary osteoarthritis, right knee: Secondary | ICD-10-CM | POA: Diagnosis not present

## 2018-07-04 DIAGNOSIS — R2681 Unsteadiness on feet: Secondary | ICD-10-CM | POA: Diagnosis not present

## 2018-07-04 DIAGNOSIS — M25561 Pain in right knee: Secondary | ICD-10-CM | POA: Diagnosis not present

## 2018-07-07 DIAGNOSIS — M1711 Unilateral primary osteoarthritis, right knee: Secondary | ICD-10-CM | POA: Diagnosis not present

## 2018-07-07 DIAGNOSIS — R2681 Unsteadiness on feet: Secondary | ICD-10-CM | POA: Diagnosis not present

## 2018-07-07 DIAGNOSIS — M25561 Pain in right knee: Secondary | ICD-10-CM | POA: Diagnosis not present

## 2018-07-10 DIAGNOSIS — R2681 Unsteadiness on feet: Secondary | ICD-10-CM | POA: Diagnosis not present

## 2018-07-10 DIAGNOSIS — M1711 Unilateral primary osteoarthritis, right knee: Secondary | ICD-10-CM | POA: Diagnosis not present

## 2018-07-10 DIAGNOSIS — M25561 Pain in right knee: Secondary | ICD-10-CM | POA: Diagnosis not present

## 2018-07-11 DIAGNOSIS — M25561 Pain in right knee: Secondary | ICD-10-CM | POA: Diagnosis not present

## 2018-07-11 DIAGNOSIS — R2681 Unsteadiness on feet: Secondary | ICD-10-CM | POA: Diagnosis not present

## 2018-07-11 DIAGNOSIS — M1711 Unilateral primary osteoarthritis, right knee: Secondary | ICD-10-CM | POA: Diagnosis not present

## 2018-07-14 DIAGNOSIS — R2681 Unsteadiness on feet: Secondary | ICD-10-CM | POA: Diagnosis not present

## 2018-07-14 DIAGNOSIS — M25561 Pain in right knee: Secondary | ICD-10-CM | POA: Diagnosis not present

## 2018-07-14 DIAGNOSIS — M1711 Unilateral primary osteoarthritis, right knee: Secondary | ICD-10-CM | POA: Diagnosis not present

## 2018-07-28 DIAGNOSIS — N3281 Overactive bladder: Secondary | ICD-10-CM | POA: Diagnosis not present

## 2018-07-28 DIAGNOSIS — C678 Malignant neoplasm of overlapping sites of bladder: Secondary | ICD-10-CM | POA: Diagnosis not present

## 2018-07-28 DIAGNOSIS — N281 Cyst of kidney, acquired: Secondary | ICD-10-CM | POA: Diagnosis not present

## 2018-08-29 DIAGNOSIS — L570 Actinic keratosis: Secondary | ICD-10-CM | POA: Diagnosis not present

## 2018-08-29 DIAGNOSIS — Z85828 Personal history of other malignant neoplasm of skin: Secondary | ICD-10-CM | POA: Diagnosis not present

## 2018-08-29 DIAGNOSIS — L821 Other seborrheic keratosis: Secondary | ICD-10-CM | POA: Diagnosis not present

## 2018-09-15 DIAGNOSIS — R05 Cough: Secondary | ICD-10-CM | POA: Diagnosis not present

## 2018-09-22 ENCOUNTER — Telehealth: Payer: Self-pay | Admitting: Cardiology

## 2018-09-22 DIAGNOSIS — E7800 Pure hypercholesterolemia, unspecified: Secondary | ICD-10-CM

## 2018-09-22 DIAGNOSIS — E785 Hyperlipidemia, unspecified: Secondary | ICD-10-CM

## 2018-09-22 NOTE — Telephone Encounter (Signed)
  [name] has an appt with Dr Radford Pax on 12/24/18 @ 9:40. She usually gets her blood work done before her visit so that the results can be discussed. Need orders so that her lab work can be scheduled.

## 2018-09-22 NOTE — Telephone Encounter (Signed)
FLP and ALT 

## 2018-09-23 NOTE — Telephone Encounter (Signed)
Spoke with the patient, her labs were scheduled on 4/13.

## 2018-10-01 DIAGNOSIS — H6980 Other specified disorders of Eustachian tube, unspecified ear: Secondary | ICD-10-CM | POA: Diagnosis not present

## 2018-10-10 DIAGNOSIS — M1711 Unilateral primary osteoarthritis, right knee: Secondary | ICD-10-CM | POA: Diagnosis not present

## 2018-10-10 DIAGNOSIS — M25561 Pain in right knee: Secondary | ICD-10-CM | POA: Diagnosis not present

## 2018-11-20 DIAGNOSIS — Z23 Encounter for immunization: Secondary | ICD-10-CM | POA: Diagnosis not present

## 2018-12-22 ENCOUNTER — Other Ambulatory Visit: Payer: Medicare HMO

## 2018-12-22 DIAGNOSIS — M79604 Pain in right leg: Secondary | ICD-10-CM | POA: Diagnosis not present

## 2018-12-22 DIAGNOSIS — M25561 Pain in right knee: Secondary | ICD-10-CM | POA: Diagnosis not present

## 2018-12-24 ENCOUNTER — Ambulatory Visit: Payer: Medicare HMO | Admitting: Cardiology

## 2018-12-24 DIAGNOSIS — M25561 Pain in right knee: Secondary | ICD-10-CM | POA: Diagnosis not present

## 2018-12-24 DIAGNOSIS — M79605 Pain in left leg: Secondary | ICD-10-CM | POA: Diagnosis not present

## 2018-12-24 DIAGNOSIS — R2242 Localized swelling, mass and lump, left lower limb: Secondary | ICD-10-CM | POA: Diagnosis not present

## 2018-12-24 DIAGNOSIS — M79604 Pain in right leg: Secondary | ICD-10-CM | POA: Diagnosis not present

## 2018-12-24 DIAGNOSIS — M25562 Pain in left knee: Secondary | ICD-10-CM | POA: Diagnosis not present

## 2019-01-19 ENCOUNTER — Other Ambulatory Visit: Payer: Medicare HMO

## 2019-01-27 ENCOUNTER — Other Ambulatory Visit: Payer: Self-pay | Admitting: Cardiology

## 2019-01-27 MED ORDER — EZETIMIBE 10 MG PO TABS: 10.0000 mg | ORAL_TABLET | Freq: Every day | ORAL | 0 refills | Status: DC

## 2019-01-27 NOTE — Telephone Encounter (Signed)
Pt's medication was sent to pt's pharmacy as requested. Confirmation received.  °

## 2019-02-03 ENCOUNTER — Other Ambulatory Visit: Payer: Medicare HMO

## 2019-02-03 ENCOUNTER — Other Ambulatory Visit: Payer: Self-pay

## 2019-02-03 DIAGNOSIS — E785 Hyperlipidemia, unspecified: Secondary | ICD-10-CM

## 2019-02-03 DIAGNOSIS — E7800 Pure hypercholesterolemia, unspecified: Secondary | ICD-10-CM

## 2019-02-03 LAB — ALT: ALT: 17 IU/L (ref 0–32)

## 2019-02-03 LAB — LIPID PANEL
Chol/HDL Ratio: 2 ratio (ref 0.0–4.4)
Cholesterol, Total: 146 mg/dL (ref 100–199)
HDL: 72 mg/dL (ref >39)
LDL Calculated: 49 mg/dL (ref 0–99)
Triglycerides: 127 mg/dL (ref 0–149)
VLDL Cholesterol Cal: 25 mg/dL (ref 5–40)

## 2019-02-04 ENCOUNTER — Telehealth: Payer: Self-pay | Admitting: Hematology and Oncology

## 2019-02-04 NOTE — Telephone Encounter (Signed)
[place] moved from 6/17 to 6/22. Confirmed with patient.

## 2019-02-06 ENCOUNTER — Telehealth: Payer: Self-pay

## 2019-02-06 NOTE — Telephone Encounter (Signed)
Called to get consent for upcoming virtual visit with Dr Radford Pax but could not leave message because no voicemail came on.

## 2019-02-08 NOTE — Progress Notes (Signed)
Virtual Visit via Telephone Note   This visit type was conducted due to national recommendations for restrictions regarding the COVID-19 Pandemic (e.g. social distancing) in an effort to limit this patient's exposure and mitigate transmission in our community.  Due to her co-morbid illnesses, this patient is at least at moderate risk for complications without adequate follow up.  This format is felt to be most appropriate for this patient at this time.  The patient did not have access to video technology/had technical difficulties with video requiring transitioning to audio format only (telephone).  All issues noted in this document were discussed and addressed.  No physical exam could be performed with this format.  Please refer to the patient's chart for her  consent to telehealth for [facility].   Evaluation Performed:  Follow-up visit  This visit type was conducted due to national recommendations for restrictions regarding the COVID-19 Pandemic (e.g. social distancing).  This format is felt to be most appropriate for this patient at this time.  All issues noted in this document were discussed and addressed.  No physical exam was performed (except for noted visual exam findings with Video Visits).  Please refer to the patient's chart (MyChart message for video visits and phone note for telephone visits) for the patient's consent to telehealth for [facility].  Date:  02/09/2019   ID:  [name], DOB 02-22-1935, MRN [id number]  Patient Location:  Home  Provider location:   [place]  PCP:  Seward Carol, MD  Cardiologist:  Fransico Him, MD Electrophysiologist:  None   Chief Complaint:  CAD, HTN, lipids  History of Present Illness:    [name] is a 84 y.o. female who presents via audio/video conferencing for a telehealth visit today.    [name] is a 84 y.o. female with a hx of ASCAD s/p PCI of the LAD in 2010, HTNand dyslipidemia.   She is here today for  followup and is doing well.  She denies any chest pain or pressure, SOB, DOE, PND, orthopnea, dizziness, palpitations or syncope. Her main compliant is that she has been having swelling in her LE in the past few months.  She pulled a hamstring muscle and then starting having swelling and a LLE venous doppler was done which was negative. This is the same leg that she had a knee replacement in and her left knee is swollen as well.  She has swelling in her RLE as well but not as pronounced.   She is compliant with her meds and is tolerating meds with no SE.    The patient does not have symptoms concerning for COVID-19 infection (fever, chills, cough, or new shortness of breath).   Prior CV studies:   The following studies were reviewed today:  none  Past Medical History:  Diagnosis Date  . Anxiety   . Arthritis   . Bladder cancer ([place]) 2012  . Breast cancer ([place])   . CAD (coronary artery disease) 2/10   S/P PCI (Stent) of LAD. Normal LVF-No angina  . Cancer of central portion of female breast, right 11/22/2015  . Cataract    left eye  . Colonic stricture ([place]) 2013   and colon adenoma  . Constipation    predominant IBS-miralax and citrucel daily-has used linzess in the past. Dr Macky Lower)  . Diverticulosis of colon (without mention of hemorrhage)   . External hemorrhoids without mention of complication   . Hyperlipidemia   . IBS (irritable bowel syndrome)   .  Insomnia   . MSSA (methicillin susceptible Staphylococcus aureus) 2011   buttock abscess  . Osteopenia    mild  . Phlebitis   . PONV (postoperative nausea and vomiting)   . Squamous cell carcinoma, leg 2012   right, left  . Thyroid disease    hypothyroidism  . Vitamin D insufficiency    Dr. Joan Flores   Past Surgical History:  Procedure Laterality Date  . Bladder cancer surgery  2012   Dr Zannie Cove  . CORONARY ANGIOPLASTY WITH STENT PLACEMENT  10/2008  . HEMORRHOID SURGERY    . KNEE SURGERY     Age 7-30  . Left elbow  surgery x4    . RADIOACTIVE SEED GUIDED PARTIAL MASTECTOMY WITH AXILLARY SENTINEL LYMPH NODE BIOPSY Right 01/04/2016   Procedure: RADIOACTIVE SEED GUIDED PARTIAL MASTECTOMY WITH AXILLARY SENTINEL LYMPH NODE BIOPSY;  Surgeon: Autumn Messing III, MD;  Location: [place];  Service: General;  Laterality: Right;  . TONSILLECTOMY    . TOTAL KNEE ARTHROPLASTY  06/2000   Left     Current Meds  Medication Sig  . acetaminophen (TYLENOL) 500 MG tablet Take 500 mg by mouth daily as needed for mild pain or headache.  . ALPRAZolam (XANAX) 0.5 MG tablet Take 0.5 mg by mouth daily as needed for anxiety. Reported on 02/22/2016  . aspirin EC 81 MG tablet Take 81 mg by mouth daily.  Marland Kitchen ezetimibe (ZETIA) 10 MG tablet Take 1 tablet (10 mg total) by mouth daily. Please keep upcoming appt in June with Dr. Radford Pax for future refills. Thank you  . ibuprofen (ADVIL,MOTRIN) 200 MG tablet Take 200 mg by mouth daily as needed for moderate pain. Reported on 02/22/2016  . letrozole (FEMARA) 2.5 MG tablet Take 1 tablet (2.5 mg total) by mouth daily.  Marland Kitchen levothyroxine (SYNTHROID, LEVOTHROID) 50 MCG tablet Take 50 mcg by mouth daily.  . Magnesium Hydroxide (MAGNESIA PO) Take 1 tablet by mouth daily.  . pravastatin (PRAVACHOL) 80 MG tablet Take 1 tablet (80 mg total) by mouth every evening.  . psyllium (HYDROCIL/METAMUCIL) 95 % PACK Take 1 packet by mouth daily.  Marland Kitchen zolpidem (AMBIEN) 10 MG tablet Take 5 mg by mouth At bedtime as needed for sleep. Reported on 01/19/2016     Allergies:   Benadryl [diphenhydramine hcl]; Crestor [rosuvastatin]; Lipitor [atorvastatin]; Morphine and related; Tramadol; Adhesive [tape]; Cortisone; and Sulfonamide derivatives   Social History   Tobacco Use  . Smoking status: Former Smoker    Types: Cigarettes    Last attempt to quit: 09/10/1984    Years since quitting: 34.4  . Smokeless tobacco: Never Used  . Tobacco comment: Quit tobacco 30 years ago.  Substance Use Topics  . Alcohol use: Yes     Alcohol/week: 7.0 standard drinks    Types: 7 Glasses of wine per week    Comment: wine  . Drug use: No     Family Hx: The patient's family history includes Alzheimer's disease in her mother; Breast cancer in her unknown relative; Breast cancer (age of onset: 58) in her sister; Coronary artery disease (age of onset: 60) in her son; Coronary artery disease (age of onset: 80) in her paternal uncle; Diabetes in her maternal grandmother; Heart attack in her maternal grandfather; Heart attack (age of onset: 31) in her father; Heart disease (age of onset: 66) in her father; Hiatal hernia in her sister; Hypertension in her brother; Lung cancer in her brother; Stroke in her son; Ulcers in her mother. There is no history of  Colon cancer, Esophageal cancer, Rectal cancer, or Stomach cancer.  ROS:   Please see the history of present illness.     All other systems reviewed and are negative.   Labs/Other Tests and Data Reviewed:    Recent Labs: 02/03/2019: ALT 17   Recent Lipid Panel Lab Results  Component Value Date/Time   CHOL 146 02/03/2019 08:37 AM   CHOL 164 06/21/2014 08:29 AM   TRIG 127 02/03/2019 08:37 AM   TRIG 115 06/21/2014 08:29 AM   HDL 72 02/03/2019 08:37 AM   HDL 78 06/21/2014 08:29 AM   CHOLHDL 2.0 02/03/2019 08:37 AM   CHOLHDL 1.8 01/25/2016 08:26 AM   LDLCALC 49 02/03/2019 08:37 AM   LDLCALC 63 06/21/2014 08:29 AM    Wt Readings from Last 3 Encounters:  02/09/19 168 lb (76.2 kg)  04/03/18 166 lb (75.3 kg)  02/18/18 165 lb 9.6 oz (75.1 kg)     Objective:    Vital Signs:  Ht 5' 2.5" (1.588 m)   Wt 168 lb (76.2 kg)   BMI 30.24 kg/m     ASSESSMENT & PLAN:    1.  ASCAD -  s/p PCI of the LAD in 2010.  She has not had any anginal symptoms since I saw her last.  She will continue on ASA 81mg  daily and statin.   2.  Hypertension - her BP is controlled on exam today.  She has not required antihypertensive meds.   3.  Hyperlipidemia -  Her LDL goal is < 70. Her last  LDL was 49 last month.  She will continue on Zetia 10mg  daily.  She is having a lot of problems with joint pain and wants to come down on the statin.  I have instructed her to decrease pravastatin to 40mg  daily and repeat lipids in 8 weeks.   4.  LE edema - the left leg is more pronounced than the right and is the leg that she had the knee replacement in and has swelling in her knee as well.  Her LE venous doppler was low.  She does live in independent living and does eat some of the food at the facility and cook some on her own.  I suspect that she is getting too much sodium in the meals that are planned by the facility.  I encouraged her to wear her compression hose as well.  I will give her a Rx for Lasix 20mg  daily PRN for LE edema.  I will check a BMET at the time of her lipids.   5.  COVID-19 Education:The signs and symptoms of COVID-19 were discussed with the patient and how to seek care for testing (follow up with PCP or arrange E-visit).  The importance of social distancing was discussed today.  Patient Risk:   After full review of this patient's clinical status, I feel that they are at least moderate risk at this time.  Time:   Today, I have spent 14 minutes directly with the patient on telephone discussing medical problems including CAD, HTN and lipids.  We also reviewed the symptoms of COVID 19 and the ways to protect against contracting the virus with telehealth technology.  I spent an additional 5 minutes reviewing patient's chart including labs.  Medication Adjustments/Labs and Tests Ordered: Current medicines are reviewed at length with the patient today.  Concerns regarding medicines are outlined above.  Tests Ordered: No orders of the defined types were placed in this encounter.  Medication Changes: No orders of  the defined types were placed in this encounter.   Disposition:  Follow up in 1 year(s)  Signed, Fransico Him, MD  02/09/2019 9:24 AM    [facility]

## 2019-02-09 ENCOUNTER — Other Ambulatory Visit: Payer: Self-pay

## 2019-02-09 ENCOUNTER — Telehealth: Payer: Self-pay

## 2019-02-09 ENCOUNTER — Encounter: Payer: Self-pay | Admitting: Cardiology

## 2019-02-09 ENCOUNTER — Telehealth (INDEPENDENT_AMBULATORY_CARE_PROVIDER_SITE_OTHER): Payer: Medicare HMO | Admitting: Cardiology

## 2019-02-09 ENCOUNTER — Ambulatory Visit: Payer: Medicare HMO | Admitting: Cardiology

## 2019-02-09 VITALS — Ht 62.5 in | Wt 168.0 lb

## 2019-02-09 DIAGNOSIS — I10 Essential (primary) hypertension: Secondary | ICD-10-CM

## 2019-02-09 DIAGNOSIS — I2510 Atherosclerotic heart disease of native coronary artery without angina pectoris: Secondary | ICD-10-CM | POA: Diagnosis not present

## 2019-02-09 DIAGNOSIS — E7800 Pure hypercholesterolemia, unspecified: Secondary | ICD-10-CM | POA: Diagnosis not present

## 2019-02-09 MED ORDER — EZETIMIBE 10 MG PO TABS: 10.0000 mg | ORAL_TABLET | Freq: Every day | ORAL | 3 refills | Status: DC

## 2019-02-09 MED ORDER — PRAVASTATIN SODIUM 40 MG PO TABS: 40.0000 mg | ORAL_TABLET | Freq: Every evening | ORAL | 3 refills | Status: DC

## 2019-02-09 MED ORDER — FUROSEMIDE 20 MG PO TABS: 20.0000 mg | ORAL_TABLET | ORAL | 3 refills | Status: DC | PRN

## 2019-02-09 NOTE — Telephone Encounter (Signed)
Obtained verbal consent for telephone visit today. Pt will try to have bp ready, already got her weight.    YOUR CARDIOLOGY TEAM HAS ARRANGED FOR AN E-VISIT FOR YOUR APPOINTMENT - PLEASE REVIEW IMPORTANT INFORMATION BELOW SEVERAL DAYS PRIOR TO YOUR APPOINTMENT  Due to the recent COVID-19 pandemic, we are transitioning in-person office visits to tele-medicine visits in an effort to decrease unnecessary exposure to our patients, their families, and staff. These visits are billed to your insurance just like a normal visit is. We also encourage you to sign up for MyChart if you have not already done so. You will need a smartphone if possible. For patients that do not have this, we can still complete the visit using a regular telephone but do prefer a smartphone to enable video when possible. You may have a family member that lives with you that can help. If possible, we also ask that you have a blood pressure cuff and scale at home to measure your blood pressure, heart rate and weight prior to your scheduled appointment. Patients with clinical needs that need an in-person evaluation and testing will still be able to come to the office if absolutely necessary. If you have any questions, feel free to call our office.     YOUR PROVIDER WILL BE USING THE FOLLOWING PLATFORM TO COMPLETE YOUR VISIT: [email or web address] . IF USING MYCHART - How to Download the MyChart App to Your SmartPhone   - If Apple, go to [employer] and type in MyChart in the search bar and download the app. If Android, ask patient to go to [employer] and type in [place] in the search bar and download the app. The app is free but as with any other app downloads, your phone may require you to verify saved payment information or Apple/Android password.  - You will need to then log into the app with your MyChart username and password, and select [facility] as your healthcare provider to link the account.  - When it is time for your visit, go to the  MyChart app, find appointments, and click Begin Video Visit. Be sure to Select Allow for your device to access the Microphone and Camera for your visit. You will then be connected, and your provider will be with you shortly.  **If you have any issues connecting or need assistance, please contact MyChart service desk [phone])**  **If using a computer, in order to ensure the best quality for your visit, you will need to use either of the following Internet Browsers: Insurance underwriter or [employer]**  . IF USING DOXIMITY or [email or web address] - The staff will give you instructions on receiving your link to join the meeting the day of your visit.      2-3 DAYS BEFORE YOUR APPOINTMENT  You will receive a telephone call from one of our [place] team members - your caller ID may say "Unknown caller." If this is a video visit, we will walk you through how to get the video launched on your phone. We will remind you check your blood pressure, heart rate and weight prior to your scheduled appointment. If you have an Apple Watch or Kardia, please upload any pertinent ECG strips the day before or morning of your appointment to [place]. Our staff will also make sure you have reviewed the consent and agree to move forward with your scheduled tele-health visit.     THE DAY OF YOUR APPOINTMENT  Approximately 15 minutes prior to your scheduled appointment,  you will receive a telephone call from one of [facility] team - your caller ID may say "Unknown caller."  Our staff will confirm medications, vital signs for the day and any symptoms you may be experiencing. Please have this information available prior to the time of visit start. It may also be helpful for you to have a pad of paper and pen handy for any instructions given during your visit. They will also walk you through joining the smartphone meeting if this is a video visit.    CONSENT FOR TELE-HEALTH VISIT - PLEASE REVIEW  I hereby voluntarily  request, consent and authorize [facility] and its employed or contracted physicians, physician assistants, nurse practitioners or other licensed health care professionals (the Practitioner), to provide me with telemedicine health care services (the "Services") as deemed necessary by the treating Practitioner. I acknowledge and consent to receive the Services by the Practitioner via telemedicine. I understand that the telemedicine visit will involve communicating with the Practitioner through live audiovisual communication technology and the disclosure of certain medical information by electronic transmission. I acknowledge that I have been given the opportunity to request an in-person assessment or other available alternative prior to the telemedicine visit and am voluntarily participating in the telemedicine visit.  I understand that I have the right to withhold or withdraw my consent to the use of telemedicine in the course of my care at any time, without affecting my right to future care or treatment, and that the Practitioner or I may terminate the telemedicine visit at any time. I understand that I have the right to inspect all information obtained and/or recorded in the course of the telemedicine visit and may receive copies of available information for a reasonable fee.  I understand that some of the potential risks of receiving the Services via telemedicine include:  Marland Kitchen Delay or interruption in medical evaluation due to technological equipment failure or disruption; . Information transmitted may not be sufficient (e.g. poor resolution of images) to allow for appropriate medical decision making by the Practitioner; and/or  . In rare instances, security protocols could fail, causing a breach of personal health information.  Furthermore, I acknowledge that it is my responsibility to provide information about my medical history, conditions and care that is complete and accurate to the best of my  ability. I acknowledge that Practitioner's advice, recommendations, and/or decision may be based on factors not within their control, such as incomplete or inaccurate data provided by me or distortions of diagnostic images or specimens that may result from electronic transmissions. I understand that the practice of medicine is not an exact science and that Practitioner makes no warranties or guarantees regarding treatment outcomes. I acknowledge that I will receive a copy of this consent concurrently upon execution via email to the email address I last provided but may also request a printed copy by calling the office of [place].    I understand that my insurance will be billed for this visit.   I have read or had this consent read to me. . I understand the contents of this consent, which adequately explains the benefits and risks of the Services being provided via telemedicine.  . I have been provided ample opportunity to ask questions regarding this consent and the Services and have had my questions answered to my satisfaction. . I give my informed consent for the services to be provided through the use of telemedicine in my medical care  By participating in this telemedicine visit  I agree to the above.

## 2019-02-09 NOTE — Patient Instructions (Addendum)
Medication Instructions:  Decrease: Pravastatin to 40 mg, daily, by mouth Start: Lasix 20 mg, as needed daily, for lower extremity edema   If you need a refill on your cardiac medications before your next appointment, please call your pharmacy.   Lab work: Fasting Labs: Lipid and Liver on 03/24/19  If you have labs (blood work) drawn today and your tests are completely normal, you will receive your results only by: Marland Kitchen MyChart Message (if you have MyChart) OR . A paper copy in the mail If you have any lab test that is abnormal or we need to change your treatment, we will call you to review the results.  Testing/Procedures: None  Follow-Up: At [facility], you and your health needs are our priority.  As part of our continuing mission to provide you with exceptional heart care, we have created designated Provider Care Teams.  These Care Teams include your primary Cardiologist (physician) and Advanced Practice Providers (APPs -  Physician Assistants and Nurse Practitioners) who all work together to provide you with the care you need, when you need it. You will need a follow up appointment in 1 years.  Please call our office 2 months in advance to schedule this appointment.  You may see Dr. Radford Pax or one of the following Advanced Practice Providers on your designated Care Team:   [place], PA-C Melina Copa, PA-C . Ermalinda Barrios, PA-C

## 2019-02-12 DIAGNOSIS — Z853 Personal history of malignant neoplasm of breast: Secondary | ICD-10-CM | POA: Diagnosis not present

## 2019-02-13 DIAGNOSIS — M25561 Pain in right knee: Secondary | ICD-10-CM | POA: Diagnosis not present

## 2019-02-13 DIAGNOSIS — M1711 Unilateral primary osteoarthritis, right knee: Secondary | ICD-10-CM | POA: Diagnosis not present

## 2019-02-24 ENCOUNTER — Telehealth: Payer: Self-pay | Admitting: Hematology and Oncology

## 2019-02-24 NOTE — Telephone Encounter (Signed)
I talk with patient regarding phone visit  °

## 2019-02-24 NOTE — Assessment & Plan Note (Signed)
Right lumpectomy 01/04/2016: Microscopic focus of invasive ductal carcinoma less than 0.1 cm with high-grade DCIS, comedo necrosis, less than 0.1 cm the superior margin, 0/9 LN negative, grade 2, ER 100%, PR 5%, HER-2 negative ratio 1.72, Ki-67 20%, T1 N0 stage IA  Patient did not get adjuvant radiation therapy because of minimal microscopic invasive cancer as well as very sensitive skin  Current treatment: Anastrozole 1 mg daily started June 2017, changedto letrozole 2.5 mg daily 05/24/2016 due to dizziness and hot flashes  Letrozole toxicities: Denies any hot flashes or myalgias. I instructed her to obtain a bone density test when she goes to see her primary care physician in August.  Surveillance: 1.[facility] 2.breast exam 02/18/2018: Benign Return to clinic in1 yearfor follow-up.

## 2019-02-25 ENCOUNTER — Ambulatory Visit: Payer: Medicare HMO | Admitting: Hematology and Oncology

## 2019-02-27 ENCOUNTER — Telehealth: Payer: Self-pay | Admitting: Hematology and Oncology

## 2019-02-28 NOTE — Progress Notes (Signed)
HEMATOLOGY-ONCOLOGY TELEPHONE VISIT PROGRESS NOTE  I connected with [name] on 03/02/2019 at 11:00 AM EDT by telephone and verified that I am speaking with the correct person using two identifiers.  I discussed the limitations, risks, security and privacy concerns of performing an evaluation and management service by telephone and the availability of in person appointments.  I also discussed with the patient that there may be a patient responsible charge related to this service. The patient expressed understanding and agreed to proceed.   History of Present Illness: [name] is a 84 y.o. female with above-mentioned history of right breast cancer treated with lumpectomy and is currently on oral antiestrogen therapy with letrozole. I last saw her a year ago. She presents over the phone today for annual follow-up. [facility] lives [facility] community. Taking precautions for COVID   Oncology History  Malignant neoplasm of central portion of right female breast ([place])  11/08/2015 Mammogram   Right breast 3.2 cm group of pleomorphic calcifications, breast density B, T2 N0 stage II a clinical stage (unclear how much of the calcification is invasive ductal carcinoma versus DCIS)   11/14/2015 Initial Diagnosis   Right breast core biopsy: Small focus of Invasive ductal carcinoma with high-grade DCIS with comedonecrosis, ER 100%, PR 5%, Ki-67 20%, HER-2 negative   01/04/2016 Surgery   Right lumpectomy: Microscopic focus of invasive ductal carcinoma less than 0.1 cm with high-grade DCIS, comedo necrosis, less than 0.1 cm the superior margin, 0/9 LN negative, grade 2, ER 100%, PR 5%, HER-2 negative ratio 1.72, Ki-67 20%, T1 N0 stage IA    02/22/2016 -  Anti-estrogen oral therapy   Anastrozole 1 mg daily switched to letrozole 2.5 mg daily 05/24/2016 due to dizziness and severe hot flashes     Observations/Objective:  No clinical evidence of disease recurrance   Assessment Plan:  Malignant neoplasm  of central portion of right female breast ([place]) Right lumpectomy 01/04/2016: Microscopic focus of invasive ductal carcinoma less than 0.1 cm with high-grade DCIS, comedo necrosis, less than 0.1 cm the superior margin, 0/9 LN negative, grade 2, ER 100%, PR 5%, HER-2 negative ratio 1.72, Ki-67 20%, T1 N0 stage IA  Patient did not get adjuvant radiation therapy because of minimal microscopic invasive cancer as well as very sensitive skin  Current treatment: Anastrozole 1 mg daily started June 2017, changedto letrozole 2.5 mg daily 05/24/2016 due to dizziness and hot flashes  Letrozole toxicities: Denies any hot flashes or myalgias. I instructed her to obtain a bone density test when she goes to see her primary care physician in August.  Surveillance: 1.[facility]amm[facility]done [facility] 02/2018: benign. 2.breast exam 02/18/2018: Benign Return to clinic in1 yearfor follow-up.    I discussed the assessment and treatment plan with the patient. The patient was provided an opportunity to ask questions and all were answered. The patient agreed with the plan and demonstrated an understanding of the instructions. The patient was advised to call back or seek an in-person evaluation if the symptoms worsen or if the condition fails to improve as anticipated.   I provided 15 minutes of non-face-to-face time during this encounter.   Rulon Eisenmenger, MD 03/02/2019    I, Molly Dorshimer, am acting as scribe for Nicholas Lose, MD.  I have reviewed the above documentation for accuracy and completeness, and I agree with the above.

## 2019-03-02 ENCOUNTER — Inpatient Hospital Stay: Payer: Medicare HMO | Attending: Hematology and Oncology | Admitting: Hematology and Oncology

## 2019-03-02 DIAGNOSIS — Z170 Estrogen receptor positive status [ER+]: Secondary | ICD-10-CM | POA: Diagnosis not present

## 2019-03-02 DIAGNOSIS — C50111 Malignant neoplasm of central portion of right female breast: Secondary | ICD-10-CM

## 2019-03-02 DIAGNOSIS — Z79811 Long term (current) use of aromatase inhibitors: Secondary | ICD-10-CM | POA: Diagnosis not present

## 2019-03-02 MED ORDER — LETROZOLE 2.5 MG PO TABS: 2.5000 mg | ORAL_TABLET | Freq: Every day | ORAL | 3 refills | Status: DC

## 2019-03-23 ENCOUNTER — Other Ambulatory Visit: Payer: Medicare HMO | Admitting: *Deleted

## 2019-03-23 ENCOUNTER — Other Ambulatory Visit: Payer: Self-pay

## 2019-03-23 DIAGNOSIS — E7800 Pure hypercholesterolemia, unspecified: Secondary | ICD-10-CM

## 2019-03-23 LAB — HEPATIC FUNCTION PANEL
ALT: 16 IU/L (ref 0–32)
AST: 19 IU/L (ref 0–40)
Albumin: 4.4 g/dL (ref 3.6–4.6)
Alkaline Phosphatase: 81 IU/L (ref 39–117)
Bilirubin Total: 0.3 mg/dL (ref 0.0–1.2)
Bilirubin, Direct: 0.1 mg/dL (ref 0.00–0.40)
Total Protein: 6.4 g/dL (ref 6.0–8.5)

## 2019-03-23 LAB — LIPID PANEL
Chol/HDL Ratio: 2 ratio (ref 0.0–4.4)
Cholesterol, Total: 186 mg/dL (ref 100–199)
HDL: 93 mg/dL (ref >39)
LDL Calculated: 69 mg/dL (ref 0–99)
Triglycerides: 120 mg/dL (ref 0–149)
VLDL Cholesterol Cal: 24 mg/dL (ref 5–40)

## 2019-03-24 ENCOUNTER — Other Ambulatory Visit: Payer: Medicare HMO

## 2019-06-22 DIAGNOSIS — M25561 Pain in right knee: Secondary | ICD-10-CM | POA: Diagnosis not present

## 2019-06-22 DIAGNOSIS — M1711 Unilateral primary osteoarthritis, right knee: Secondary | ICD-10-CM | POA: Diagnosis not present

## 2019-06-22 DIAGNOSIS — M25551 Pain in right hip: Secondary | ICD-10-CM | POA: Diagnosis not present

## 2019-06-25 DIAGNOSIS — Z1389 Encounter for screening for other disorder: Secondary | ICD-10-CM | POA: Diagnosis not present

## 2019-06-25 DIAGNOSIS — H9190 Unspecified hearing loss, unspecified ear: Secondary | ICD-10-CM | POA: Diagnosis not present

## 2019-06-25 DIAGNOSIS — I25119 Atherosclerotic heart disease of native coronary artery with unspecified angina pectoris: Secondary | ICD-10-CM | POA: Diagnosis not present

## 2019-06-25 DIAGNOSIS — Z0000 Encounter for general adult medical examination without abnormal findings: Secondary | ICD-10-CM | POA: Diagnosis not present

## 2019-06-25 DIAGNOSIS — C679 Malignant neoplasm of bladder, unspecified: Secondary | ICD-10-CM | POA: Diagnosis not present

## 2019-06-25 DIAGNOSIS — E7800 Pure hypercholesterolemia, unspecified: Secondary | ICD-10-CM | POA: Diagnosis not present

## 2019-06-25 DIAGNOSIS — E039 Hypothyroidism, unspecified: Secondary | ICD-10-CM | POA: Diagnosis not present

## 2019-06-25 DIAGNOSIS — R69 Illness, unspecified: Secondary | ICD-10-CM | POA: Diagnosis not present

## 2019-06-25 DIAGNOSIS — G4700 Insomnia, unspecified: Secondary | ICD-10-CM | POA: Diagnosis not present

## 2019-06-25 DIAGNOSIS — C50111 Malignant neoplasm of central portion of right female breast: Secondary | ICD-10-CM | POA: Diagnosis not present

## 2019-07-21 DIAGNOSIS — H903 Sensorineural hearing loss, bilateral: Secondary | ICD-10-CM | POA: Diagnosis not present

## 2019-07-27 DIAGNOSIS — G500 Trigeminal neuralgia: Secondary | ICD-10-CM | POA: Diagnosis not present

## 2019-08-10 DIAGNOSIS — N3281 Overactive bladder: Secondary | ICD-10-CM | POA: Diagnosis not present

## 2019-08-10 DIAGNOSIS — C678 Malignant neoplasm of overlapping sites of bladder: Secondary | ICD-10-CM | POA: Diagnosis not present

## 2019-08-10 DIAGNOSIS — N281 Cyst of kidney, acquired: Secondary | ICD-10-CM | POA: Diagnosis not present

## 2019-08-25 DIAGNOSIS — H02831 Dermatochalasis of right upper eyelid: Secondary | ICD-10-CM | POA: Diagnosis not present

## 2019-08-25 DIAGNOSIS — H02834 Dermatochalasis of left upper eyelid: Secondary | ICD-10-CM | POA: Diagnosis not present

## 2019-08-25 DIAGNOSIS — H26493 Other secondary cataract, bilateral: Secondary | ICD-10-CM | POA: Diagnosis not present

## 2019-08-25 DIAGNOSIS — H10413 Chronic giant papillary conjunctivitis, bilateral: Secondary | ICD-10-CM | POA: Diagnosis not present

## 2019-08-25 DIAGNOSIS — H43811 Vitreous degeneration, right eye: Secondary | ICD-10-CM | POA: Diagnosis not present

## 2019-08-25 DIAGNOSIS — Z961 Presence of intraocular lens: Secondary | ICD-10-CM | POA: Diagnosis not present

## 2019-08-25 DIAGNOSIS — H04123 Dry eye syndrome of bilateral lacrimal glands: Secondary | ICD-10-CM | POA: Diagnosis not present

## 2019-11-23 ENCOUNTER — Ambulatory Visit: Payer: Medicare HMO | Admitting: Certified Nurse Midwife

## 2019-11-24 NOTE — Progress Notes (Signed)
85 y.o. G64P2002 Married  Caucasian Fe here for annual exam. Post menopausal no HRT. Denies vaginal bleeding or vaginal dryness. Declines pap smear this year unless absolutely needed.  Continues with oncology once yearly, still on Femara and doing well! Sees PCP for cholesterol, hypertension, insomnia, anxiety medication management. Family available if needed for assistance. Has taken Covid vaccine with no issues. No balance or fall issues in last year. No other health issues today.  No LMP recorded. Patient is postmenopausal.          Sexually active: No.  The current method of family planning is post menopausal status.    Exercising: Yes.    some Smoker:  no  Review of Systems  Constitutional: Negative.   HENT: Negative.   Eyes: Negative.   Respiratory: Negative.   Cardiovascular: Negative.   Gastrointestinal: Positive for constipation.  Genitourinary: Negative.   Musculoskeletal: Negative.   Skin: Negative.   Neurological: Negative.   Endo/Heme/Allergies: Negative.   Psychiatric/Behavioral: Negative.     Health Maintenance: Pap:  02-22-16 neg, patient declines further paps  History of Abnormal Pap: no MMG:  02-12-2019 category b density birads 2:neg, hx of breast cancer Self Breast exams: occ Colonoscopy:  2013 BMD:   2019 TDaP:  2014 Shingles: not done Pneumonia: 2016 Hep C and HIV: not done Labs: if needed   reports that she quit smoking about 35 years ago. Her smoking use included cigarettes. She has never used smokeless tobacco. She reports current alcohol use of about 7.0 standard drinks of alcohol per week. She reports that she does not use drugs.  Past Medical History:  Diagnosis Date  . Anxiety   . Arthritis   . Bladder cancer ([place]) 2012  . Breast cancer ([place])   . CAD (coronary artery disease) 2/10   S/P PCI (Stent) of LAD. Normal LVF-No angina  . Cancer of central portion of female breast, right 11/22/2015  . Cataract    left eye  . Colonic stricture ([place]) 2013    and colon adenoma  . Constipation    predominant IBS-miralax and citrucel daily-has used linzess in the past. Dr Macky Lower)  . Diverticulosis of colon (without mention of hemorrhage)   . External hemorrhoids without mention of complication   . Hyperlipidemia   . IBS (irritable bowel syndrome)   . Insomnia   . MSSA (methicillin susceptible Staphylococcus aureus) 2011   buttock abscess  . Osteopenia    mild  . Phlebitis   . PONV (postoperative nausea and vomiting)   . Squamous cell carcinoma, leg 2012   right, left  . Thyroid disease    hypothyroidism  . Vitamin D insufficiency    Dr. Joan Flores    Past Surgical History:  Procedure Laterality Date  . Bladder cancer surgery  2012   Dr Zannie Cove  . CORONARY ANGIOPLASTY WITH STENT PLACEMENT  10/2008  . HEMORRHOID SURGERY    . KNEE SURGERY     Age 65-30  . Left elbow surgery x4    . RADIOACTIVE SEED GUIDED PARTIAL MASTECTOMY WITH AXILLARY SENTINEL LYMPH NODE BIOPSY Right 01/04/2016   Procedure: RADIOACTIVE SEED GUIDED PARTIAL MASTECTOMY WITH AXILLARY SENTINEL LYMPH NODE BIOPSY;  Surgeon: Autumn Messing III, MD;  Location: [facility];  Service: General;  Laterality: Right;  . TONSILLECTOMY    . TOTAL KNEE ARTHROPLASTY  06/2000   Left    Current Outpatient Medications  Medication Sig Dispense Refill  . acetaminophen (TYLENOL) 500 MG tablet Take 500 mg by mouth.     Marland Kitchen  ALPRAZolam (XANAX) 0.5 MG tablet Take 0.5 mg by mouth daily as needed for anxiety. Reported on 02/22/2016    . aspirin EC 81 MG tablet Take 81 mg by mouth daily.    Marland Kitchen ezetimibe (ZETIA) 10 MG tablet Take 1 tablet (10 mg total) by mouth daily. Please keep upcoming appt in June with Dr. Radford Pax for future refills. Thank you 90 tablet 3  . ibuprofen (ADVIL,MOTRIN) 200 MG tablet Take 200 mg by mouth daily as needed for moderate pain. Reported on 02/22/2016    . letrozole (FEMARA) 2.5 MG tablet Take 1 tablet (2.5 mg total) by mouth daily. 90 tablet 3  . levothyroxine (SYNTHROID,  LEVOTHROID) 50 MCG tablet Take 50 mcg by mouth daily.    . Magnesium Hydroxide (MAGNESIA PO) Take 1 tablet by mouth daily.    . pravastatin (PRAVACHOL) 40 MG tablet Take 1 tablet (40 mg total) by mouth every evening. 90 tablet 3  . psyllium (HYDROCIL/METAMUCIL) 95 % PACK Take 1 packet by mouth daily.    Marland Kitchen zolpidem (AMBIEN) 10 MG tablet Take 5 mg by mouth At bedtime as needed for sleep. Reported on 01/19/2016     No current facility-administered medications for this visit.    Family History  Problem Relation Age of Onset  . Heart disease Father 71       Died of MI  . Heart attack Father 12  . Hiatal hernia Sister   . Breast cancer Sister 48  . Ulcers Mother        peptic  . Alzheimer's disease Mother   . Hypertension Brother   . Lung cancer Brother   . Breast cancer Unknown        aunt  . Coronary artery disease Son 64       MI  . Stroke Son   . Coronary artery disease Paternal Uncle 54       Died with CAD  . Diabetes Maternal Grandmother   . Heart attack Maternal Grandfather        70's  . Colon cancer Neg Hx   . Esophageal cancer Neg Hx   . Rectal cancer Neg Hx   . Stomach cancer Neg Hx     ROS:  Pertinent items are noted in HPI.  Otherwise, a comprehensive ROS was negative.  Exam:   BP 130/80   Pulse 70   Temp (!) 97.2 F (36.2 C) (Skin)   Resp 16   Ht 5' 2.25" (1.581 m)   Wt 166 lb (75.3 kg)   BMI 30.12 kg/m  Height: 5' 2.25" (158.1 cm) Ht Readings from Last 3 Encounters:  11/25/19 5' 2.25" (1.581 m)  02/09/19 5' 2.5" (1.588 m)  04/03/18 5' 2.25" (1.581 m)    General appearance: alert, cooperative and appears stated age Head: Normocephalic, without obvious abnormality, atraumatic Neck: no adenopathy, supple, symmetrical, trachea midline and thyroid normal to inspection and palpation Lungs: clear to auscultation bilaterally Breasts: normal appearance, no masses or tenderness, No nipple retraction or dimpling, No nipple discharge or bleeding, No axillary or  supraclavicular adenopathy Heart: regular rate and rhythm Abdomen: soft, non-tender; no masses,  no organomegaly Extremities: extremities normal, atraumatic, no cyanosis or edema Skin: Skin color, texture, turgor normal. No rashes or lesions Lymph nodes: Cervical, supraclavicular, and axillary nodes normal. No abnormal inguinal nodes palpated Neurologic: Grossly normal   Pelvic: External genitalia:  no lesions              Urethra:  normal appearing urethra  with no masses, tenderness or lesions              Bartholin's and Skene's: normal                 Vagina: normal appearing vagina with normal color and discharge, no lesions              Cervix: no cervical motion tenderness, no lesions and normal appearance              Pap taken: No. Bimanual Exam:  Uterus:  normal size, contour, position, consistency, mobility, non-tender and anteverted              Adnexa: normal adnexa and no mass, fullness, tenderness               Rectovaginal: Confirms               Anus:  normal sphincter tone, no lesions  Chaperone present: yes  A:  Well Woman with normal exam  Post menopausal   History of breast cancer on Femara  History of bladder cancer, no reoccurrence  Hypothyroid/cholesterol,insomnia with MD management  P:   Reviewed health and wellness pertinent to exam  Discussed importance of calling if vaginal bleeding  Continue follow up with Oncology as indicated  and schedule mammogram  Continue follow up with MD as indicated  Pap smear: no   counseled on breast self exam, mammography screening, feminine hygiene, menopause, adequate intake of calcium and vitamin D, diet and exercise, Kegel's exercises  return annually or prn  An After Visit Summary was printed and given to the patient.

## 2019-11-25 ENCOUNTER — Other Ambulatory Visit: Payer: Self-pay

## 2019-11-25 ENCOUNTER — Encounter: Payer: Self-pay | Admitting: Certified Nurse Midwife

## 2019-11-25 ENCOUNTER — Ambulatory Visit (INDEPENDENT_AMBULATORY_CARE_PROVIDER_SITE_OTHER): Payer: Medicare Other | Admitting: Certified Nurse Midwife

## 2019-11-25 VITALS — BP 130/80 | HR 70 | Temp 97.2°F | Resp 16 | Ht 62.25 in | Wt 166.0 lb

## 2019-11-25 DIAGNOSIS — Z01419 Encounter for gynecological examination (general) (routine) without abnormal findings: Secondary | ICD-10-CM

## 2019-11-25 NOTE — Patient Instructions (Signed)
EXERCISE AND DIET:  We recommended that you start or continue a regular exercise program for good health. Regular exercise means any activity that makes your heart beat faster and makes you sweat.  We recommend exercising at least 30 minutes per day at least 3 days a week, preferably 4 or 5.  We also recommend a diet low in fat and sugar.  Inactivity, poor dietary choices and obesity can cause diabetes, heart attack, stroke, and kidney damage, among others.   ° °ALCOHOL AND SMOKING:  Women should limit their alcohol intake to no more than 7 drinks/beers/glasses of wine (combined, not each!) per week. Moderation of alcohol intake to this level decreases your risk of breast cancer and liver damage. And of course, no recreational drugs are part of a healthy lifestyle.  And absolutely no smoking or even second hand smoke. Most people know smoking can cause heart and lung diseases, but did you know it also contributes to weakening of your bones? Aging of your skin?  Yellowing of your teeth and nails? ° °CALCIUM AND VITAMIN D:  Adequate intake of calcium and Vitamin D are recommended.  The recommendations for exact amounts of these supplements seem to change often, but generally speaking 600 mg of calcium (either carbonate or citrate) and 800 units of Vitamin D per day seems prudent. Certain women may benefit from higher intake of Vitamin D.  If you are among these women, your doctor will have told you during your visit.   ° °PAP SMEARS:  Pap smears, to check for cervical cancer or precancers,  have traditionally been done yearly, although recent scientific advances have shown that most women can have pap smears less often.  However, every woman still should have a physical exam from her gynecologist every year. It will include a breast check, inspection of the vulva and vagina to check for abnormal growths or skin changes, a visual exam of the cervix, and then an exam to evaluate the size and shape of the uterus and  ovaries.  And after 83 years of age, a rectal exam is indicated to check for rectal cancers. We will also provide age appropriate advice regarding health maintenance, like when you should have certain vaccines, screening for sexually transmitted diseases, bone density testing, colonoscopy, mammograms, etc.  ° °MAMMOGRAMS:  All women over 40 years old should have a yearly mammogram. Many facilities now offer a "3D" mammogram, which may cost around $50 extra out of pocket. If possible,  we recommend you accept the option to have the 3D mammogram performed.  It both reduces the number of women who will be called back for extra views which then turn out to be normal, and it is better than the routine mammogram at detecting truly abnormal areas.   ° °COLONOSCOPY:  Colonoscopy to screen for colon cancer is recommended for all women at age 50.  We know, you hate the idea of the prep.  We agree, BUT, having colon cancer and not knowing it is worse!!  Colon cancer so often starts as a polyp that can be seen and removed at colonscopy, which can quite literally save your life!  And if your first colonoscopy is normal and you have no family history of colon cancer, most women don't have to have it again for 10 years.  Once every ten years, you can do something that may end up saving your life, right?  We will be happy to help you get it scheduled when you are ready.    Be sure to check your insurance coverage so you understand how much it will cost.  It may be covered as a preventative service at no cost, but you should check your particular policy.      Atrophic Vaginitis Atrophic vaginitis is a condition in which the tissues that line the vagina become dry and thin. This condition occurs in women who have stopped having their period. It is caused by a drop in a female hormone (estrogen). This hormone helps:  To keep the vagina moist.  To make a clear fluid. This clear fluid helps: ? To make the vagina ready for  sex. ? To protect the vagina from infection. If the lining of the vagina is dry and thin, it may cause irritation, burning, or itchiness. It may also:  Make sex painful.  Make an exam of your vagina painful.  Cause bleeding.  Make you lose interest in sex.  Cause a burning feeling when you pee (urinate).  Cause a brown or yellow fluid to come from your vagina. Some women do not have symptoms. Follow these instructions at home: Medicines  Take over-the-counter and prescription medicines only as told by your doctor.  Do not use herbs or other medicines unless your doctor says it is okay.  Use medicines for for dryness. These include: ? Oils to make the vagina soft. ? Creams. ? Moisturizers. General instructions  Do not douche.  Do not use products that can make your vagina dry. These include: ? Scented sprays. ? Scented tampons. ? Scented soaps.  Sex can help increase blood flow and soften the tissue in the vagina. If it hurts to have sex: ? Tell your partner. ? Use products to make sex more comfortable. Use these only as told by your doctor. Contact a doctor if you:  Have discharge from the vagina that is different than usual.  Have a bad smell coming from your vagina.  Have new symptoms.  Do not get better.  Get worse. Summary  Atrophic vaginitis is a condition in which the lining of the vagina becomes dry and thin.  This condition affects women who have stopped having their periods.  Treatment may include using products that help make the vagina soft.  Call a doctor if do not get better with treatment. This information is not intended to replace advice given to you by your health care provider. Make sure you discuss any questions you have with your health care provider. Document Revised: 09/09/2017 Document Reviewed: 09/09/2017 Elsevier Patient Education  2020 [employer].

## 2019-12-01 ENCOUNTER — Telehealth: Payer: Self-pay | Admitting: Cardiology

## 2019-12-01 ENCOUNTER — Encounter: Payer: Self-pay | Admitting: Certified Nurse Midwife

## 2019-12-01 DIAGNOSIS — E7800 Pure hypercholesterolemia, unspecified: Secondary | ICD-10-CM

## 2019-12-01 DIAGNOSIS — E785 Hyperlipidemia, unspecified: Secondary | ICD-10-CM

## 2019-12-01 DIAGNOSIS — I2510 Atherosclerotic heart disease of native coronary artery without angina pectoris: Secondary | ICD-10-CM

## 2019-12-01 DIAGNOSIS — I10 Essential (primary) hypertension: Secondary | ICD-10-CM

## 2019-12-01 NOTE — Telephone Encounter (Signed)
Patient just made her annual appointment to see Dr. Radford Pax for 02-09-20. The patient wanted to know if Dr. Radford Pax could put in the lab orders now so that she can get the labs done in time for her appointment.

## 2019-12-01 NOTE — Telephone Encounter (Signed)
Patient would like to come in a week prior to her appointment to have lab work done so that it can be discussed at her appointment. Will forward to Dr. Radford Pax for orders if needed.

## 2019-12-01 NOTE — Telephone Encounter (Signed)
She will need a CMET and FLP

## 2019-12-14 ENCOUNTER — Other Ambulatory Visit: Payer: Self-pay | Admitting: *Deleted

## 2019-12-14 MED ORDER — LETROZOLE 2.5 MG PO TABS: 2.5000 mg | ORAL_TABLET | Freq: Every day | ORAL | 3 refills | Status: DC

## 2020-02-02 ENCOUNTER — Other Ambulatory Visit: Payer: Medicare Other | Admitting: *Deleted

## 2020-02-02 ENCOUNTER — Other Ambulatory Visit: Payer: Self-pay

## 2020-02-02 DIAGNOSIS — I2510 Atherosclerotic heart disease of native coronary artery without angina pectoris: Secondary | ICD-10-CM

## 2020-02-02 DIAGNOSIS — E785 Hyperlipidemia, unspecified: Secondary | ICD-10-CM

## 2020-02-02 DIAGNOSIS — E7800 Pure hypercholesterolemia, unspecified: Secondary | ICD-10-CM

## 2020-02-02 DIAGNOSIS — I10 Essential (primary) hypertension: Secondary | ICD-10-CM

## 2020-02-02 LAB — COMPREHENSIVE METABOLIC PANEL
ALT: 16 IU/L (ref 0–32)
AST: 21 IU/L (ref 0–40)
Albumin/Globulin Ratio: 2 (ref 1.2–2.2)
Albumin: 4.5 g/dL (ref 3.6–4.6)
Alkaline Phosphatase: 94 IU/L (ref 48–121)
BUN/Creatinine Ratio: 23 (ref 12–28)
BUN: 20 mg/dL (ref 8–27)
Bilirubin Total: 0.5 mg/dL (ref 0.0–1.2)
CO2: 22 mmol/L (ref 20–29)
Calcium: 9.5 mg/dL (ref 8.7–10.3)
Chloride: 104 mmol/L (ref 96–106)
Creatinine, Ser: 0.86 mg/dL (ref 0.57–1.00)
GFR calc Af Amer: 73 mL/min/{1.73_m2} (ref >59)
GFR calc non Af Amer: 63 mL/min/{1.73_m2} (ref >59)
Globulin, Total: 2.3 g/dL (ref 1.5–4.5)
Glucose: 86 mg/dL (ref 65–99)
Potassium: 4.5 mmol/L (ref 3.5–5.2)
Sodium: 141 mmol/L (ref 134–144)
Total Protein: 6.8 g/dL (ref 6.0–8.5)

## 2020-02-02 LAB — LIPID PANEL
Chol/HDL Ratio: 2.3 ratio (ref 0.0–4.4)
Cholesterol, Total: 169 mg/dL (ref 100–199)
HDL: 75 mg/dL (ref >39)
LDL Chol Calc (NIH): 70 mg/dL (ref 0–99)
Triglycerides: 142 mg/dL (ref 0–149)
VLDL Cholesterol Cal: 24 mg/dL (ref 5–40)

## 2020-02-09 ENCOUNTER — Encounter: Payer: Self-pay | Admitting: Cardiology

## 2020-02-09 ENCOUNTER — Ambulatory Visit: Payer: Medicare Other | Admitting: Cardiology

## 2020-02-09 ENCOUNTER — Other Ambulatory Visit: Payer: Self-pay

## 2020-02-09 VITALS — BP 148/70 | HR 89 | Ht 62.5 in | Wt 169.6 lb

## 2020-02-09 DIAGNOSIS — R600 Localized edema: Secondary | ICD-10-CM | POA: Diagnosis not present

## 2020-02-09 DIAGNOSIS — I10 Essential (primary) hypertension: Secondary | ICD-10-CM | POA: Diagnosis not present

## 2020-02-09 DIAGNOSIS — I2510 Atherosclerotic heart disease of native coronary artery without angina pectoris: Secondary | ICD-10-CM | POA: Diagnosis not present

## 2020-02-09 DIAGNOSIS — E7800 Pure hypercholesterolemia, unspecified: Secondary | ICD-10-CM

## 2020-02-09 MED ORDER — EZETIMIBE 10 MG PO TABS: 10.0000 mg | ORAL_TABLET | Freq: Every day | ORAL | 3 refills | Status: DC

## 2020-02-09 MED ORDER — PRAVASTATIN SODIUM 40 MG PO TABS: 40.0000 mg | ORAL_TABLET | Freq: Every evening | ORAL | 3 refills | Status: DC

## 2020-02-09 NOTE — Progress Notes (Signed)
Date:  02/09/2020   ID:  [name], DOB 02-Jan-1935, MRN [id number]   PCP:  Seward Carol, MD  Cardiologist:  Fransico Him, MD Electrophysiologist:  None   Chief Complaint:  CAD, HTN, lipids  History of Present Illness:    [name] is a 85 y.o. female with a hx of ASCAD s/p PCI of the LAD in 2010, HTNand dyslipidemia.   She is here today for followup and is doing well.  She denies any chest pain or pressure, SOB, DOE, PND, orthopnea, LE edema, dizziness, palpitations or syncope. She is compliant with her meds and is tolerating meds with no SE.     Prior CV studies:   The following studies were reviewed today:  EKG  Past Medical History:  Diagnosis Date  . Anxiety   . Arthritis   . Bladder cancer ([place]) 2012  . Breast cancer ([place])   . CAD (coronary artery disease) 2/10   S/P PCI (Stent) of LAD. Normal LVF-No angina  . Cancer of central portion of female breast, right 11/22/2015  . Cataract    left eye  . Colonic stricture ([place]) 2013   and colon adenoma  . Constipation    predominant IBS-miralax and citrucel daily-has used linzess in the past. Dr Macky Lower)  . Diverticulosis of colon (without mention of hemorrhage)   . External hemorrhoids without mention of complication   . Hyperlipidemia   . IBS (irritable bowel syndrome)   . Insomnia   . MSSA (methicillin susceptible Staphylococcus aureus) 2011   buttock abscess  . Osteopenia    mild  . Phlebitis   . PONV (postoperative nausea and vomiting)   . Shingles   . Squamous cell carcinoma, leg 2012   right, left  . Thyroid disease    hypothyroidism  . Vitamin D insufficiency    Dr. Joan Flores   Past Surgical History:  Procedure Laterality Date  . Bladder cancer surgery  2012   Dr Zannie Cove  . CORONARY ANGIOPLASTY WITH STENT PLACEMENT  10/2008  . HEMORRHOID SURGERY    . KNEE SURGERY     Age 84-30  . Left elbow surgery x4    . RADIOACTIVE SEED GUIDED PARTIAL MASTECTOMY WITH AXILLARY SENTINEL LYMPH NODE  BIOPSY Right 01/04/2016   Procedure: RADIOACTIVE SEED GUIDED PARTIAL MASTECTOMY WITH AXILLARY SENTINEL LYMPH NODE BIOPSY;  Surgeon: Autumn Messing III, MD;  Location: [facility];  Service: General;  Laterality: Right;  . TONSILLECTOMY    . TOTAL KNEE ARTHROPLASTY  06/2000   Left     Current Meds  Medication Sig  . acetaminophen (TYLENOL) 500 MG tablet Take 500 mg by mouth.   . ALPRAZolam (XANAX) 0.5 MG tablet Take 0.5 mg by mouth daily as needed for anxiety. Reported on 02/22/2016  . aspirin EC 81 MG tablet Take 81 mg by mouth daily.  Marland Kitchen ezetimibe (ZETIA) 10 MG tablet Take 1 tablet (10 mg total) by mouth daily. Please keep upcoming appt in June with Dr. Radford Pax for future refills. Thank you  . ibuprofen (ADVIL,MOTRIN) 200 MG tablet Take 200 mg by mouth daily as needed for moderate pain. Reported on 02/22/2016  . letrozole (FEMARA) 2.5 MG tablet Take 1 tablet (2.5 mg total) by mouth daily.  Marland Kitchen levothyroxine (SYNTHROID, LEVOTHROID) 50 MCG tablet Take 50 mcg by mouth daily.  . Magnesium Hydroxide (MAGNESIA PO) Take 1 tablet by mouth daily.  . pravastatin (PRAVACHOL) 40 MG tablet Take 1 tablet (40 mg total) by mouth every evening.  Marland Kitchen  psyllium (HYDROCIL/METAMUCIL) 95 % PACK Take 1 packet by mouth daily.  Marland Kitchen zolpidem (AMBIEN) 10 MG tablet Take 5 mg by mouth At bedtime as needed for sleep. Reported on 01/19/2016     Allergies:   Benadryl [diphenhydramine hcl], Crestor [rosuvastatin], Lipitor [atorvastatin], Morphine and related, Tramadol, Adhesive [tape], Cortisone, and Sulfonamide derivatives   Social History   Tobacco Use  . Smoking status: Former Smoker    Types: Cigarettes    Quit date: 09/10/1984    Years since quitting: 35.4  . Smokeless tobacco: Never Used  . Tobacco comment: Quit tobacco 30 years ago.  Substance Use Topics  . Alcohol use: Yes    Alcohol/week: 7.0 standard drinks    Types: 7 Glasses of wine per week    Comment: wine  . Drug use: No     Family Hx: The patient's family history  includes Alzheimer's disease in her mother; Breast cancer in an other family member; Breast cancer (age of onset: 45) in her sister; Coronary artery disease (age of onset: 63) in her son; Coronary artery disease (age of onset: 83) in her paternal uncle; Diabetes in her maternal grandmother; Heart attack in her maternal grandfather; Heart attack (age of onset: 57) in her father; Heart disease (age of onset: 80) in her father; Hiatal hernia in her sister; Hypertension in her brother; Lung cancer in her brother; Stroke in her son; Ulcers in her mother. There is no history of Colon cancer, Esophageal cancer, Rectal cancer, or Stomach cancer.  ROS:   Please see the history of present illness.     All other systems reviewed and are negative.   Labs/Other Tests and Data Reviewed:    Recent Labs: 02/02/2020: ALT 16; BUN 20; Creatinine, Ser 0.86; Potassium 4.5; Sodium 141   Recent Lipid Panel Lab Results  Component Value Date/Time   CHOL 169 02/02/2020 09:17 AM   CHOL 164 06/21/2014 08:29 AM   TRIG 142 02/02/2020 09:17 AM   TRIG 115 06/21/2014 08:29 AM   HDL 75 02/02/2020 09:17 AM   HDL 78 06/21/2014 08:29 AM   CHOLHDL 2.3 02/02/2020 09:17 AM   CHOLHDL 1.8 01/25/2016 08:26 AM   LDLCALC 70 02/02/2020 09:17 AM   LDLCALC 63 06/21/2014 08:29 AM    Wt Readings from Last 3 Encounters:  02/09/20 169 lb 9.6 oz (76.9 kg)  11/25/19 166 lb (75.3 kg)  02/09/19 168 lb (76.2 kg)     Objective:    Vital Signs:  BP (!) 148/70   Pulse 89   Ht 5' 2.5" (1.588 m)   Wt 169 lb 9.6 oz (76.9 kg)   BMI 30.53 kg/m   GEN: Well nourished, well developed in no acute distress HEENT: Normal NECK: No JVD; No carotid bruits LYMPHATICS: No lymphadenopathy CARDIAC:RRR, no murmurs, rubs, gallops RESPIRATORY:  Clear to auscultation without rales, wheezing or rhonchi  ABDOMEN: Soft, non-tender, non-distended MUSCULOSKELETAL:  trace edema; No deformity  SKIN: Warm and dry NEUROLOGIC:  Alert and oriented x  3 PSYCHIATRIC:  Normal affect   EKG performed today and shows NSR with no ST changes  ASSESSMENT & PLAN:    1.  ASCAD  - s/p PCI of the LAD in 2010.   -she denies any anginal sx -continue ASA 81mg  daily and statin  2.  Hypertension  -BP borderline controlled on exam - likely related to white coat HTN -she has not required BP meds -I have asked her to check her BP daily for a week and call with  results  3.  Hyperlipidemia  -Her LDL goal is < 70.  -Her last LDL was 70 last month -continue Pravastatin 40mg  daily and Zetia 10mg  daily  4.  LE edema  -she has chronic LE edema from a knee replacement and dietary indiscretion with Na due to eating food prepared at assisted living -Her LE venous doppler negative in the past   -encouraged to continue compression hose PRN and Lasix 20mg  PRN  -Creatinine 0.86 last month  Medication Adjustments/Labs and Tests Ordered: Current medicines are reviewed at length with the patient today.  Concerns regarding medicines are outlined above.  Tests Ordered: No orders of the defined types were placed in this encounter.  Medication Changes: No orders of the defined types were placed in this encounter.   Disposition:  Follow up in 1 year(s)  Signed, Fransico Him, MD  02/09/2020 10:39 AM    [facility]

## 2020-02-09 NOTE — Addendum Note (Signed)
Addended by: Antonieta Iba on: 02/09/2020 10:52 AM   Modules accepted: Orders

## 2020-02-09 NOTE — Patient Instructions (Signed)

## 2020-02-11 ENCOUNTER — Telehealth: Payer: Self-pay | Admitting: Hematology and Oncology

## 2020-02-11 NOTE — Telephone Encounter (Signed)
Called pt per 6/3 sch message - unable to reach patient . Left message for patient to call back for reschedule.

## 2020-02-15 ENCOUNTER — Telehealth: Payer: Self-pay | Admitting: Hematology and Oncology

## 2020-02-15 NOTE — Telephone Encounter (Signed)
Called pt per 6/7 sch message - no answer and no vmail .

## 2020-02-15 NOTE — Telephone Encounter (Signed)
R/s appt from 6/22 to 6/11 per pt request.

## 2020-02-18 NOTE — Progress Notes (Signed)
Patient Care Team: Seward Carol, MD as PCP - General (Internal Medicine) Sueanne Margarita, MD as PCP - Cardiology (Cardiology) Sylvan Cheese, NP as Nurse Practitioner (Hematology and Oncology)  DIAGNOSIS:    ICD-10-CM   1. Malignant neoplasm of central portion of right breast in female, estrogen receptor positive ([place])  C50.111    Z17.0     SUMMARY OF ONCOLOGIC HISTORY: Oncology History  Malignant neoplasm of central portion of right female breast ([place])  11/08/2015 Mammogram   Right breast 3.2 cm group of pleomorphic calcifications, breast density B, T2 N0 stage II a clinical stage (unclear how much of the calcification is invasive ductal carcinoma versus DCIS)   11/14/2015 Initial Diagnosis   Right breast core biopsy: Small focus of Invasive ductal carcinoma with high-grade DCIS with comedonecrosis, ER 100%, PR 5%, Ki-67 20%, HER-2 negative   01/04/2016 Surgery   Right lumpectomy: Microscopic focus of invasive ductal carcinoma less than 0.1 cm with high-grade DCIS, comedo necrosis, less than 0.1 cm the superior margin, 0/9 LN negative, grade 2, ER 100%, PR 5%, HER-2 negative ratio 1.72, Ki-67 20%, T1 N0 stage IA    02/22/2016 -  Anti-estrogen oral therapy   Anastrozole 1 mg daily switched to letrozole 2.5 mg daily 05/24/2016 due to dizziness and severe hot flashes     CHIEF COMPLIANT: Follow-up of right breast cancer on letrozole  INTERVAL HISTORY: [name] is a 85 y.o. with above-mentioned history of right breast cancer treated with lumpectomy and is currently on oral antiestrogen therapy with letrozole. She presents to the clinic today for annual follow-up.  She reports that her health has been excellent and she has been tolerating the medicine extremely well.  She is very worried about her husband's health he had a stroke and he requires a walker to get around.  The strength apparently is declining.  She denies any pain lumps or nodules in the breast.  His mammograms  were normal.  ALLERGIES:  is allergic to benadryl [diphenhydramine hcl], crestor [rosuvastatin], lipitor [atorvastatin], morphine and related, tramadol, adhesive [tape], cortisone, and sulfonamide derivatives.  MEDICATIONS:  Current Outpatient Medications  Medication Sig Dispense Refill  . acetaminophen (TYLENOL) 500 MG tablet Take 500 mg by mouth.     . ALPRAZolam (XANAX) 0.5 MG tablet Take 0.5 mg by mouth daily as needed for anxiety. Reported on 02/22/2016    . aspirin EC 81 MG tablet Take 81 mg by mouth daily.    Marland Kitchen ezetimibe (ZETIA) 10 MG tablet Take 1 tablet (10 mg total) by mouth daily. 90 tablet 3  . ibuprofen (ADVIL,MOTRIN) 200 MG tablet Take 200 mg by mouth daily as needed for moderate pain. Reported on 02/22/2016    . letrozole (FEMARA) 2.5 MG tablet Take 1 tablet (2.5 mg total) by mouth daily. 90 tablet 3  . levothyroxine (SYNTHROID, LEVOTHROID) 50 MCG tablet Take 50 mcg by mouth daily.    . Magnesium Hydroxide (MAGNESIA PO) Take 1 tablet by mouth daily.    . pravastatin (PRAVACHOL) 40 MG tablet Take 1 tablet (40 mg total) by mouth every evening. 90 tablet 3  . psyllium (HYDROCIL/METAMUCIL) 95 % PACK Take 1 packet by mouth daily.    Marland Kitchen zolpidem (AMBIEN) 10 MG tablet Take 5 mg by mouth At bedtime as needed for sleep. Reported on 01/19/2016     No current facility-administered medications for this visit.    PHYSICAL EXAMINATION: ECOG PERFORMANCE STATUS: 1 - Symptomatic but completely ambulatory  Vitals:  02/19/20 1455  BP: (!) 160/71  Pulse: 92  Resp: 17  Temp: 98.3 F (36.8 C)  SpO2: 100%   Filed Weights   02/19/20 1455  Weight: 171 lb 1.6 oz (77.6 kg)    BREAST: No palpable masses or nodules in either right or left breasts. No palpable axillary supraclavicular or infraclavicular adenopathy no breast tenderness or nipple discharge. (exam performed in the presence of a chaperone)  LABORATORY DATA:  I have reviewed the data as listed CMP Latest Ref Rng & Units 02/02/2020  03/23/2019 02/03/2019  Glucose 65 - 99 mg/dL 86 - -  BUN 8 - 27 mg/dL 20 - -  Creatinine 0.57 - 1.00 mg/dL 0.86 - -  Sodium 134 - 144 mmol/L 141 - -  Potassium 3.5 - 5.2 mmol/L 4.5 - -  Chloride 96 - 106 mmol/L 104 - -  CO2 20 - 29 mmol/L 22 - -  Calcium 8.7 - 10.3 mg/dL 9.5 - -  Total Protein 6.0 - 8.5 g/dL 6.8 6.4 -  Total Bilirubin 0.0 - 1.2 mg/dL 0.5 0.3 -  Alkaline Phos 48 - 121 IU/L 94 81 -  AST 0 - 40 IU/L 21 19 -  ALT 0 - 32 IU/L 16 16 17     Lab Results  Component Value Date   WBC 6.1 10/23/2017   HGB 13.4 10/23/2017   HCT 39.4 10/23/2017   MCV 94.6 10/23/2017   PLT 268.0 10/23/2017   NEUTROABS 3.9 10/23/2017    ASSESSMENT & PLAN:  Malignant neoplasm of central portion of right female breast ([place]) Right lumpectomy 01/04/2016: Microscopic focus of invasive ductal carcinoma less than 0.1 cm with high-grade DCIS, comedo necrosis, less than 0.1 cm the superior margin, 0/9 LN negative, grade 2, ER 100%, PR 5%, HER-2 negative ratio 1.72, Ki-67 20%, T1 N0 stage IA  Patient did not get adjuvant radiation therapy because of minimal microscopic invasive cancer as well as very sensitive skin  Current treatment: Anastrozole 1 mg daily started June 2017, changedto letrozole 2.5 mg daily 05/24/2016 due to dizziness and hot flashes  Letrozole toxicities: Denies any hot flashes or myalgias. I instructed her to obtain a bone density test when she goes to see her primary care physician in August.  Surveillance: 1.Mammograms at [facility]  02/12/2019: benign.  Breast density category B 2.breast exam 02/19/2020: Benign  Return to clinic in1 yearfor follow-up.    No orders of the defined types were placed in this encounter.  The patient has a good understanding of the overall plan. she agrees with it. she will call with any problems that may develop before the next visit here.  Total time spent: 20 mins including face to face time and time spent for planning, charting and  coordination of care  Nicholas Lose, MD 02/19/2020  I, Cloyde Reams Dorshimer, am acting as scribe for Dr. Nicholas Lose.  I have reviewed the above documentation for accuracy and completeness, and I agree with the above.

## 2020-02-19 ENCOUNTER — Inpatient Hospital Stay: Payer: Medicare Other | Attending: Hematology and Oncology | Admitting: Hematology and Oncology

## 2020-02-19 ENCOUNTER — Other Ambulatory Visit: Payer: Self-pay

## 2020-02-19 DIAGNOSIS — Z79811 Long term (current) use of aromatase inhibitors: Secondary | ICD-10-CM | POA: Diagnosis not present

## 2020-02-19 DIAGNOSIS — Z923 Personal history of irradiation: Secondary | ICD-10-CM | POA: Insufficient documentation

## 2020-02-19 DIAGNOSIS — Z170 Estrogen receptor positive status [ER+]: Secondary | ICD-10-CM | POA: Insufficient documentation

## 2020-02-19 DIAGNOSIS — Z7982 Long term (current) use of aspirin: Secondary | ICD-10-CM | POA: Insufficient documentation

## 2020-02-19 DIAGNOSIS — R232 Flushing: Secondary | ICD-10-CM | POA: Insufficient documentation

## 2020-02-19 DIAGNOSIS — Z79899 Other long term (current) drug therapy: Secondary | ICD-10-CM | POA: Diagnosis not present

## 2020-02-19 DIAGNOSIS — C50111 Malignant neoplasm of central portion of right female breast: Secondary | ICD-10-CM | POA: Diagnosis not present

## 2020-02-19 MED ORDER — LETROZOLE 2.5 MG PO TABS: 2.5000 mg | ORAL_TABLET | Freq: Every day | ORAL | 3 refills | Status: DC

## 2020-02-19 NOTE — Assessment & Plan Note (Signed)
Right lumpectomy 01/04/2016: Microscopic focus of invasive ductal carcinoma less than 0.1 cm with high-grade DCIS, comedo necrosis, less than 0.1 cm the superior margin, 0/9 LN negative, grade 2, ER 100%, PR 5%, HER-2 negative ratio 1.72, Ki-67 20%, T1 N0 stage IA  Patient did not get adjuvant radiation therapy because of minimal microscopic invasive cancer as well as very sensitive skin  Current treatment: Anastrozole 1 mg daily started June 2017, changedto letrozole 2.5 mg daily 05/24/2016 due to dizziness and hot flashes  Letrozole toxicities: Denies any hot flashes or myalgias. I instructed her to obtain a bone density test when she goes to see her primary care physician in August.  Surveillance: 1.Mammograms at [facility]  02/12/2019: benign.  Breast density category B 2.breast exam 02/19/2020: Benign  Return to clinic in1 yearfor follow-up.

## 2020-02-22 ENCOUNTER — Telehealth: Payer: Self-pay | Admitting: Hematology and Oncology

## 2020-02-22 NOTE — Telephone Encounter (Signed)
Scheduled per 06/11 los, patient has been called and voicemail was left. 

## 2020-02-24 ENCOUNTER — Telehealth: Payer: Self-pay | Admitting: Cardiology

## 2020-02-24 NOTE — Telephone Encounter (Signed)
Follow Up:  Pt was told to call back with her blood pressure readings.   02-15-20- 122/44  02-16-20-  158/76  02-17-20-  134/65  6-10--21-  117/79  02-19-20-  108/85  02-20-20-  138/63  02-21-20-  158/73

## 2020-02-24 NOTE — Telephone Encounter (Signed)
BPs borderline normal.  PLease have her avoid added salt in her diet and repeat BP readings for a week and call

## 2020-02-24 NOTE — Telephone Encounter (Signed)
Spoke with the patient and advised her on foods to avoid that are high in sodium. Patient will work on cutting those out of her diet. She will record her BP for another week and call us with the readings.

## 2020-03-01 ENCOUNTER — Ambulatory Visit: Payer: Medicare HMO | Admitting: Hematology and Oncology

## 2020-03-08 ENCOUNTER — Telehealth: Payer: Self-pay | Admitting: Cardiology

## 2020-03-08 NOTE — Telephone Encounter (Signed)
Spoke with the patient's husband and advised him that patient's Bps look great and Dr. Radford Pax is not going to make any changes at this time. He verbalized understanding and will let the patient know.

## 2020-03-08 NOTE — Telephone Encounter (Signed)
Patient calling with BP readings for 1 week:  133/71, 139/57, 142/71, 144/88, 143/63, 148/71, 137/92 today

## 2020-03-08 NOTE — Telephone Encounter (Signed)
For most part BPs look great!  No changes at this time

## 2020-09-13 DIAGNOSIS — T7840XA Allergy, unspecified, initial encounter: Secondary | ICD-10-CM | POA: Diagnosis not present

## 2020-09-21 DIAGNOSIS — C678 Malignant neoplasm of overlapping sites of bladder: Secondary | ICD-10-CM | POA: Diagnosis not present

## 2020-09-21 DIAGNOSIS — N281 Cyst of kidney, acquired: Secondary | ICD-10-CM | POA: Diagnosis not present

## 2020-09-21 DIAGNOSIS — N3281 Overactive bladder: Secondary | ICD-10-CM | POA: Diagnosis not present

## 2020-09-21 DIAGNOSIS — Z8551 Personal history of malignant neoplasm of bladder: Secondary | ICD-10-CM | POA: Diagnosis not present

## 2020-09-22 DIAGNOSIS — R0989 Other specified symptoms and signs involving the circulatory and respiratory systems: Secondary | ICD-10-CM | POA: Diagnosis not present

## 2020-09-22 DIAGNOSIS — R059 Cough, unspecified: Secondary | ICD-10-CM | POA: Diagnosis not present

## 2020-09-22 DIAGNOSIS — R509 Fever, unspecified: Secondary | ICD-10-CM | POA: Diagnosis not present

## 2020-09-22 DIAGNOSIS — Z20822 Contact with and (suspected) exposure to covid-19: Secondary | ICD-10-CM | POA: Diagnosis not present

## 2020-10-05 DIAGNOSIS — L570 Actinic keratosis: Secondary | ICD-10-CM | POA: Diagnosis not present

## 2020-10-05 DIAGNOSIS — L239 Allergic contact dermatitis, unspecified cause: Secondary | ICD-10-CM | POA: Diagnosis not present

## 2020-10-05 DIAGNOSIS — Z85828 Personal history of other malignant neoplasm of skin: Secondary | ICD-10-CM | POA: Diagnosis not present

## 2020-10-05 DIAGNOSIS — D0472 Carcinoma in situ of skin of left lower limb, including hip: Secondary | ICD-10-CM | POA: Diagnosis not present

## 2020-10-05 DIAGNOSIS — C44729 Squamous cell carcinoma of skin of left lower limb, including hip: Secondary | ICD-10-CM | POA: Diagnosis not present

## 2020-10-21 DIAGNOSIS — D0472 Carcinoma in situ of skin of left lower limb, including hip: Secondary | ICD-10-CM | POA: Diagnosis not present

## 2020-11-10 DIAGNOSIS — M1711 Unilateral primary osteoarthritis, right knee: Secondary | ICD-10-CM | POA: Diagnosis not present

## 2020-11-24 NOTE — Progress Notes (Signed)
86 y.o. [id number] Married White or Caucasian female here for breast & pelvic exam.     Will be 5 years s/p breast cancer. Going back for cancer check in June. 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 Patient is postmenopausal.          Sexually active: No.  The current method of family planning is post menopausal status.    Exercising: Yes.    chair yoga Smoker:  no  Health Maintenance: Pap:  02-22-16 neg, patient declines further paps History of abnormal Pap:  no MMG:  02-16-2020 category b density birads 2:neg Colonoscopy:  2013 BMD:   2019 TDaP:  2014 Gardasil:   n/a Covid-19: moderna Hep C testing: not done    reports that she quit smoking about 36 years ago. Her smoking use included cigarettes. She has never used smokeless tobacco. She reports current alcohol use of about 7.0 standard drinks of alcohol per week. She reports that she does not use drugs.  Past Medical History:  Diagnosis Date  . Anxiety   . Arthritis   . Bladder cancer ([place]) 2012  . Breast cancer ([place])   . CAD (coronary artery disease) 2/10   S/P PCI (Stent) of LAD. Normal LVF-No angina  . Cancer of central portion of female breast, right 11/22/2015  . Cataract    left eye  . Colonic stricture ([place]) 2013   and colon adenoma  . Constipation    predominant IBS-miralax and citrucel daily-has used linzess in the past. Dr Macky Lower)  . Diverticulosis of colon (without mention of hemorrhage)   . External hemorrhoids without mention of complication   . Hyperlipidemia   . IBS (irritable bowel syndrome)   . Insomnia   . MSSA (methicillin susceptible Staphylococcus aureus) 2011   buttock abscess   . Osteopenia    mild  . Phlebitis   . PONV (postoperative nausea and vomiting)   . Shingles   . Squamous cell carcinoma, leg 2012   right, left  . Thyroid disease    hypothyroidism  . Vitamin D insufficiency    Dr. Joan Flores    Past Surgical History:  Procedure Laterality Date  . Bladder cancer surgery  2012   Dr Zannie Cove  . CORONARY ANGIOPLASTY WITH STENT PLACEMENT  10/2008  . HEMORRHOID SURGERY    . KNEE SURGERY     Age 75-32  . Left elbow surgery x4    . RADIOACTIVE SEED GUIDED PARTIAL MASTECTOMY WITH AXILLARY SENTINEL LYMPH NODE BIOPSY Right 01/04/2016   Procedure: RADIOACTIVE SEED GUIDED PARTIAL MASTECTOMY WITH AXILLARY SENTINEL LYMPH NODE BIOPSY;  Surgeon: Autumn Messing III, MD;  Location: [facility];  Service: General;  Laterality: Right;  . TONSILLECTOMY    . TOTAL KNEE ARTHROPLASTY  06/2000   Left    Current Outpatient Medications  Medication Sig Dispense Refill  . acetaminophen (TYLENOL) 500 MG tablet Take 500 mg by mouth.    . ALPRAZolam (XANAX) 0.5 MG tablet Take 0.5 mg by mouth daily as needed for anxiety. Reported on 02/22/2016    . ascorbic acid (VITAMIN C) 500 MG tablet Take 1 tablet by mouth daily.    Marland Kitchen  aspirin EC 81 MG tablet Take 81 mg by mouth daily.    . B Complex Vitamins (B COMPLEX PO) Take by mouth.    . ezetimibe (ZETIA) 10 MG tablet Take 1 tablet (10 mg total) by mouth daily. 90 tablet 3  . hydrocortisone 2.5 % cream Apply topically.    Marland Kitchen letrozole (FEMARA) 2.5 MG tablet Take 1 tablet (2.5 mg total) by mouth daily. 90 tablet 3  . levothyroxine (SYNTHROID, LEVOTHROID) 50 MCG tablet Take 50 mcg by mouth daily.    . Magnesium Hydroxide (MAGNESIA PO) Take 1 tablet by mouth daily.    . mupirocin ointment (BACTROBAN) 2 % Apply topically daily.    . pravastatin (PRAVACHOL) 40 MG tablet Take 1 tablet (40 mg total) by mouth every evening. 90 tablet 3  . psyllium (HYDROCIL/METAMUCIL) 95 % PACK Take 1 packet by mouth daily.    Marland Kitchen VITAMIN D PO Take by mouth.    . zinc  sulfate 220 (50 Zn) MG capsule Take 1 capsule by mouth daily.    . traZODone (DESYREL) 50 MG tablet 1- 2 tablets (Patient not taking: Reported on 11/25/2020)     No current facility-administered medications for this visit.    Family History  Problem Relation Age of Onset  . Heart disease Father 50       Died of MI  . Heart attack Father 28  . Hiatal hernia Sister   . Breast cancer Sister 45  . Ulcers Mother        peptic  . Alzheimer's disease Mother   . Hypertension Brother   . Lung cancer Brother   . Breast cancer Other        aunt  . Coronary artery disease Son 57       MI  . Stroke Son   . Coronary artery disease Paternal Uncle 33       Died with CAD  . Diabetes Maternal Grandmother   . Heart attack Maternal Grandfather        70's  . Colon cancer Neg Hx   . Esophageal cancer Neg Hx   . Rectal cancer Neg Hx   . Stomach cancer Neg Hx     Review of Systems  Constitutional: Negative.   HENT: Negative.   Eyes: Negative.   Respiratory: Negative.   Cardiovascular: Negative.   Gastrointestinal: Negative.   Endocrine: Negative.   Genitourinary: Negative.   Musculoskeletal: Negative.   Skin: Negative.   Allergic/Immunologic: Negative.   Neurological: Negative.   Hematological: Negative.   Psychiatric/Behavioral: Negative.     Exam:   BP 118/78   Pulse 70   Resp 16   Ht 5' 2.25" (1.581 m)   Wt 166 lb (75.3 kg)   BMI 30.12 kg/m   Height: 5' 2.25" (158.1 cm)  General appearance: alert, cooperative and appears stated age, no acute distress Head: Normocephalic, without obvious abnormality Neck: no adenopathy, thyroid normal to inspection and palpation Lungs: clear to auscultation bilaterally Breasts: no palpable mass, scar noted on right breast Heart: regular rate and rhythm Abdomen: soft, non-tender; no masses,  no organomegaly Extremities: extremities normal, no edema Skin: Hx Skin bx in January on lower left leg, still wearing bandage, not healing. Skin not  warm to the touch, mildly swollen, skin discolored (although similar color as right leg), not indurated. Lymph nodes: Cervical, supraclavicular, and axillary nodes normal. No abnormal inguinal nodes palpated Neurologic: Grossly normal   Pelvic: External genitalia:  no lesions  Urethra:  normal appearing urethra with no masses, tenderness or lesions              Bartholins and Skenes: normal                 Vagina: normal appearing vagina, atrophyappropriate for age, normal appearing discharge, no lesions              Cervix: neg cervical motion tenderness, no visible lesions             Bimanual Exam:   Uterus:  normal size, contour, position, consistency, mobility, non-tender              Adnexa: no mass, fullness, tenderness   Rectal: hemorrhoid noted, no palpable mass                 A:  Well Woman with normal exam  Breast and Pelvic screening  Lower leg delayed healing  Hx breast cancer 2017  Hx bladder cancer 2012  P:   Pap :discontinue  Mammogram: due 02/2021  DEXA: pt to discuss with PCP  Colonoscopy: pt states will discontinue  Advised to call dermatologist for evaluation, skin not healing normally

## 2020-11-25 ENCOUNTER — Other Ambulatory Visit: Payer: Self-pay

## 2020-11-25 ENCOUNTER — Encounter: Payer: Self-pay | Admitting: Nurse Practitioner

## 2020-11-25 ENCOUNTER — Ambulatory Visit: Payer: Medicare Other | Admitting: Nurse Practitioner

## 2020-11-25 VITALS — BP 118/78 | HR 70 | Resp 16 | Ht 62.25 in | Wt 166.0 lb

## 2020-11-25 DIAGNOSIS — Z01419 Encounter for gynecological examination (general) (routine) without abnormal findings: Secondary | ICD-10-CM | POA: Diagnosis not present

## 2020-11-25 NOTE — Patient Instructions (Addendum)
Genitourinary Syndrome of Menopause (GSM)  Genitourinary Syndrome of menopause is a term that describes the spectrum of changes caused by the lack of estrogen in menopause. Common Signs and Symptoms Include: Vaginal dryness, irritation/burning/itching. Abnormal discharge, vaginal pressure, pain with sex, decreased sexual arousal/desire, difficulty achieving orgasm, pain with urination, urgency, incontinence of urine, recurrent bladder infections, urethral prolapse and others. Diagnosis can usually be made with history and pelvic exam. Other testing may be considered to rule out other potential abnormalities. Symptoms can be progressive and chronic. The goal of treatment is symptom relief.  There are several different approaches to improving symptoms:  Vaginal Moisturizers and lubricants: Glycerin Moisturizer (Replens), Silicone based products (Uberlube), water-based products (Restore Moisturizing Gel by [employer]), Hyaluronic Acid vaginal products (such as HYALO GYN), and natural oils such as Olive Oil, Almond Oil and Coconut Oil (Since coconut oil comes in solid preparations, you can form them into suppositories and insert into the vagina as needed). These products are Over-the-Counter (OTC) at [employer] and retail stores and DO NOT contain hormones.  How to use vaginal and vulvar moisturizers . Many vaginal moisturizers come with an applicator. You will need fill the applicator with the moisturizer and then insert it carefully into your vagina. You can put lubricant on the tip of the applicator to make it easier to insert into your vagina. . You can also use vaginal moisturizers on your vulvar tissues, including your inner and outer labia (the folds of skin around your vagina). To put these moisturizers on your vulva, put a small amount (pea or grape size) of moisturizer on your finger. Then, massage the moisturizer into your vaginal opening and onto your labia. . If you recently finished  cancer treatment, or are going through sudden menopause, you may need to use the moisturizers 3 to 5 times a week to relieve your symptoms. . Vaginal and vulvar moisturizers should be used before you go to bed, so the product can be fully absorbed.  Lifestyle modifications: smoking cessation, pelvic floor physical therapy, Kegel's exercises, vaginal dilators  Non-hormonal therapies: Lidocaine (topical anesthetic) to decrease sensitivity, Oral Ospemifeme (requires prescription), Laser therapy (Pros and Cons should be discussed with provider)    Health Maintenance After Age 84 After age 11, you are at a higher risk for certain long-term diseases and infections as well as injuries from falls. Falls are a major cause of broken bones and head injuries in people who are older than age 84. Getting regular preventive care can help to keep you healthy and well. Preventive care includes getting regular testing and making lifestyle changes as recommended by your health care provider. Talk with your health care provider about:  Which screenings and tests you should have. A screening is a test that checks for a disease when you have no symptoms.  A diet and exercise plan that is right for you. What should I know about screenings and tests to prevent falls? Screening and testing are the best ways to find a health problem early. Early diagnosis and treatment give you the best chance of managing medical conditions that are common after age 84. Certain conditions and lifestyle choices may make you more likely to have a fall. Your health care provider may recommend:  Regular vision checks. Poor vision and conditions such as cataracts can make you more likely to have a fall. If you wear glasses, make sure to get your prescription updated if your vision changes.  Medicine review. Work with your health care  provider to regularly review all of the medicines you are taking, including over-the-counter medicines. Ask  your health care provider about any side effects that may make you more likely to have a fall. Tell your health care provider if any medicines that you take make you feel dizzy or sleepy.  Osteoporosis screening. Osteoporosis is a condition that causes the bones to get weaker. This can make the bones weak and cause them to break more easily.  Blood pressure screening. Blood pressure changes and medicines to control blood pressure can make you feel dizzy.  Strength and balance checks. Your health care provider may recommend certain tests to check your strength and balance while standing, walking, or changing positions.  Foot health exam. Foot pain and numbness, as well as not wearing proper footwear, can make you more likely to have a fall.  Depression screening. You may be more likely to have a fall if you have a fear of falling, feel emotionally low, or feel unable to do activities that you used to do.  Alcohol use screening. Using too much alcohol can affect your balance and may make you more likely to have a fall. What actions can I take to lower my risk of falls? General instructions  Talk with your health care provider about your risks for falling. Tell your health care provider if: ? You fall. Be sure to tell your health care provider about all falls, even ones that seem minor. ? You feel dizzy, sleepy, or off-balance.  Take over-the-counter and prescription medicines only as told by your health care provider. These include any supplements.  Eat a healthy diet and maintain a healthy weight. A healthy diet includes low-fat dairy products, low-fat (lean) meats, and fiber from whole grains, beans, and lots of fruits and vegetables. Home safety  Remove any tripping hazards, such as rugs, cords, and clutter.  Install safety equipment such as grab bars in bathrooms and safety rails on stairs.  Keep rooms and walkways well-lit. Activity  Follow a regular exercise program to stay fit.  This will help you maintain your balance. Ask your health care provider what types of exercise are appropriate for you.  If you need a cane or walker, use it as recommended by your health care provider.  Wear supportive shoes that have nonskid soles.   Lifestyle  Do not drink alcohol if your health care provider tells you not to drink.  If you drink alcohol, limit how much you have: ? 0-1 drink a day for women. ? 0-2 drinks a day for men.  Be aware of how much alcohol is in your drink. In the [place] one drink equals one typical bottle of beer (12 oz), one-half glass of wine (5 oz), or one shot of hard liquor (1 oz).  Do not use any products that contain nicotine or tobacco, such as cigarettes and e-cigarettes. If you need help quitting, ask your health care provider. Summary  Having a healthy lifestyle and getting preventive care can help to protect your health and wellness after age 13.  Screening and testing are the best way to find a health problem early and help you avoid having a fall. Early diagnosis and treatment give you the best chance for managing medical conditions that are more common for people who are older than age 92.  Falls are a major cause of broken bones and head injuries in people who are older than age 69. Take precautions to prevent a fall at home.  Work  with your health care provider to learn what changes you can make to improve your health and wellness and to prevent falls. This information is not intended to replace advice given to you by your health care provider. Make sure you discuss any questions you have with your health care provider. Document Revised: 12/18/2018 Document Reviewed: 07/10/2017 Elsevier Patient Education  2021 [employer].

## 2020-12-14 ENCOUNTER — Telehealth: Payer: Self-pay | Admitting: Cardiology

## 2020-12-14 NOTE — Telephone Encounter (Signed)
I would prefer for her to wait since I do not have a copy of her last labs from her PCP and do not want to duplicate

## 2020-12-14 NOTE — Telephone Encounter (Signed)
Patient is scheduled for her annual appointment but does not have lab orders in yet but is scheduled for her annual visit 02/07/21. Patient states she normally has labs done prior. Please order apprpriate labs

## 2020-12-14 NOTE — Telephone Encounter (Signed)
Patient would like to have lab work done prior to seeing Dr. Radford Pax in May.

## 2020-12-14 NOTE — Telephone Encounter (Signed)
Spoke with the patient and advised her that Dr. Radford Pax would prefer for her to wait to have lab work until the day off or sometime after her appointment so that we can ensure we are not duplicating any lab work as well as getting all the lab work that Dr. Radford Pax feels necessary after her visit. Patient verbalized understanding.

## 2021-02-07 ENCOUNTER — Ambulatory Visit: Payer: Medicare Other | Admitting: Cardiology

## 2021-02-08 ENCOUNTER — Encounter: Payer: Self-pay | Admitting: Cardiology

## 2021-02-08 ENCOUNTER — Ambulatory Visit: Payer: Medicare Other | Admitting: Cardiology

## 2021-02-08 ENCOUNTER — Other Ambulatory Visit: Payer: Self-pay

## 2021-02-08 VITALS — BP 146/98 | HR 76 | Ht 62.5 in | Wt 165.6 lb

## 2021-02-08 DIAGNOSIS — R600 Localized edema: Secondary | ICD-10-CM

## 2021-02-08 DIAGNOSIS — I10 Essential (primary) hypertension: Secondary | ICD-10-CM | POA: Diagnosis not present

## 2021-02-08 DIAGNOSIS — I2510 Atherosclerotic heart disease of native coronary artery without angina pectoris: Secondary | ICD-10-CM | POA: Diagnosis not present

## 2021-02-08 DIAGNOSIS — E7800 Pure hypercholesterolemia, unspecified: Secondary | ICD-10-CM

## 2021-02-08 NOTE — Progress Notes (Signed)
Date:  02/08/2021   ID:  [name], DOB 1935-09-05, MRN [id number]   PCP:  Seward Carol, MD  Cardiologist:  Fransico Him, MD Electrophysiologist:  None   Chief Complaint:  CAD, HTN, lipids  History of Present Illness:    [name] is a 86 y.o. female with a hx of ASCAD s/p PCI of the LAD in 2010, HTNand dyslipidemia.   She is here today for followup and is doing well.  She has mild DOE that she thinks is stable and related to sedentary state and lack of exercise.  She does do some chair yoga.  She denies any chest pain or pressure,  PND, orthopnea, dizziness, palpitations or syncope. She has chronic LE edema which is stable.  She is compliant with her meds and is tolerating meds with no SE.    Prior CV studies:   The following studies were reviewed today:  EKG  Past Medical History:  Diagnosis Date  . Anxiety   . Arthritis   . Bladder cancer ([place]) 2012  . Breast cancer ([place])   . CAD (coronary artery disease) 2/10   S/P PCI (Stent) of LAD. Normal LVF-No angina  . Cancer of central portion of female breast, right 11/22/2015  . Cataract    left eye  . Colonic stricture ([place]) 2013   and colon adenoma  . Constipation    predominant IBS-miralax and citrucel daily-has used linzess in the past. Dr Macky Lower)  . Diverticulosis of colon (without mention of hemorrhage)   . External hemorrhoids without mention of complication   . Hyperlipidemia   . IBS (irritable bowel syndrome)   . Insomnia   . MSSA (methicillin susceptible Staphylococcus aureus) 2011   buttock abscess  . Osteopenia    mild  . Phlebitis   . PONV (postoperative nausea and vomiting)   . Shingles   . Squamous cell carcinoma, leg 2012   right, left  . Thyroid disease    hypothyroidism  . Vitamin D insufficiency    Dr. Joan Flores   Past Surgical History:  Procedure Laterality Date  . Bladder cancer surgery  2012   Dr Zannie Cove  . CORONARY ANGIOPLASTY WITH STENT PLACEMENT  10/2008  . HEMORRHOID  SURGERY    . KNEE SURGERY     Age 59-30  . Left elbow surgery x4    . RADIOACTIVE SEED GUIDED PARTIAL MASTECTOMY WITH AXILLARY SENTINEL LYMPH NODE BIOPSY Right 01/04/2016   Procedure: RADIOACTIVE SEED GUIDED PARTIAL MASTECTOMY WITH AXILLARY SENTINEL LYMPH NODE BIOPSY;  Surgeon: Autumn Messing III, MD;  Location: [facility];  Service: General;  Laterality: Right;  . TONSILLECTOMY    . TOTAL KNEE ARTHROPLASTY  06/2000   Left     Current Meds  Medication Sig  . acetaminophen (TYLENOL) 500 MG tablet Take 500 mg by mouth.  . ALPRAZolam (XANAX) 0.5 MG tablet Take 0.25 mg by mouth daily as needed for sleep. Reported on 02/22/2016  . ascorbic acid (VITAMIN C) 500 MG tablet Take 1 tablet by mouth daily.  Marland Kitchen aspirin EC 81 MG tablet Take 81 mg by mouth daily.  . B Complex Vitamins (B COMPLEX PO) Take by mouth.  . ezetimibe (ZETIA) 10 MG tablet Take 1 tablet (10 mg total) by mouth daily.  . hydrocortisone 2.5 % cream Apply topically.  Marland Kitchen letrozole (FEMARA) 2.5 MG tablet Take 1 tablet (2.5 mg total) by mouth daily.  Marland Kitchen levothyroxine (SYNTHROID, LEVOTHROID) 50 MCG tablet Take 50 mcg by mouth daily.  Marland Kitchen  Magnesium Hydroxide (MAGNESIA PO) Take 1 tablet by mouth daily.  . mupirocin ointment (BACTROBAN) 2 % Apply topically daily.  . pravastatin (PRAVACHOL) 40 MG tablet Take 1 tablet (40 mg total) by mouth every evening.  . psyllium (HYDROCIL/METAMUCIL) 95 % PACK Take 1 packet by mouth daily.  Marland Kitchen VITAMIN D PO Take by mouth.  . zinc sulfate 220 (50 Zn) MG capsule Take 1 capsule by mouth daily.     Allergies:   Benadryl [diphenhydramine hcl], Lipitor [atorvastatin], Morphine and related, Rosuvastatin, Tramadol, Cephalexin, Adhesive [tape], Cortisone, and Sulfonamide derivatives   Social History   Tobacco Use  . Smoking status: Former Smoker    Types: Cigarettes    Quit date: 09/10/1984    Years since quitting: 36.4  . Smokeless tobacco: Never Used  . Tobacco comment: Quit tobacco 30 years ago.  Vaping Use  . Vaping  Use: Never used  Substance Use Topics  . Alcohol use: Yes    Alcohol/week: 7.0 standard drinks    Types: 7 Glasses of wine per week    Comment: wine  . Drug use: No     Family Hx: The patient's family history includes Alzheimer's disease in her mother; Breast cancer in an other family member; Breast cancer (age of onset: 51) in her sister; Coronary artery disease (age of onset: 30) in her son; Coronary artery disease (age of onset: 20) in her paternal uncle; Diabetes in her maternal grandmother; Heart attack in her maternal grandfather; Heart attack (age of onset: 57) in her father; Heart disease (age of onset: 49) in her father; Hiatal hernia in her sister; Hypertension in her brother; Lung cancer in her brother; Stroke in her son; Ulcers in her mother. There is no history of Colon cancer, Esophageal cancer, Rectal cancer, or Stomach cancer.  ROS:   Please see the history of present illness.     All other systems reviewed and are negative.   Labs/Other Tests and Data Reviewed:    Recent Labs: No results found for requested labs within last 8760 hours.   Recent Lipid Panel Lab Results  Component Value Date/Time   CHOL 169 02/02/2020 09:17 AM   CHOL 164 06/21/2014 08:29 AM   TRIG 142 02/02/2020 09:17 AM   TRIG 115 06/21/2014 08:29 AM   HDL 75 02/02/2020 09:17 AM   HDL 78 06/21/2014 08:29 AM   CHOLHDL 2.3 02/02/2020 09:17 AM   CHOLHDL 1.8 01/25/2016 08:26 AM   LDLCALC 70 02/02/2020 09:17 AM   LDLCALC 63 06/21/2014 08:29 AM    Wt Readings from Last 3 Encounters:  02/08/21 165 lb 9.6 oz (75.1 kg)  11/25/20 166 lb (75.3 kg)  02/19/20 171 lb 1.6 oz (77.6 kg)     Objective:    Vital Signs:  BP (!) 146/98 (BP Location: Left Arm, Patient Position: Sitting, Cuff Size: Normal) Comment: Left arm automatic 181/91  Pulse 76   Ht 5' 2.5" (1.588 m)   Wt 165 lb 9.6 oz (75.1 kg)   BMI 29.81 kg/m   GEN: Well nourished, well developed in no acute distress HEENT: Normal NECK: No JVD;  No carotid bruits LYMPHATICS: No lymphadenopathy CARDIAC:RRR, no murmurs, rubs, gallops RESPIRATORY:  Clear to auscultation without rales, wheezing or rhonchi  ABDOMEN: Soft, non-tender, non-distended MUSCULOSKELETAL:  No edema; No deformity  SKIN: Warm and dry NEUROLOGIC:  Alert and oriented x 3 PSYCHIATRIC:  Normal affect   EKG performed today and shows NSR with inferior infarct  ASSESSMENT & PLAN:  1.  ASCAD  - s/p PCI of the LAD in 2010.   -she has not had any anginal symptoms since I saw her last -Continue prescription drug management with ASA 81mg  daily and Pravastatin  2.  Hypertension  -Bp is poorly controlled on exam today but she has white coat HTN and at home runs 130/38mmHg -she has not required BP meds  3.  Hyperlipidemia  -Her LDL goal is < 70.  -I have personally reviewed and interpreted outside labs performed by patient's PCP which showed LDL 59, HDL 77, TAG 166 and ALT 14 in Oct 2021 -Continue prescription drug management with Pravastatin 40mg  daily and Zetia 10mg  daily  4.  LE edema  -she has chronic LE edema from a knee replacement and dietary indiscretion with Na due to eating food prepared at assisted living -Her LE venous doppler negative in the past   -encouraged to continue compression hose PRN and Lasix 20mg  PRN  -I have personally reviewed and interpreted outside labs performed by patient's PCP which showed SCr 0.85 and K+ 4.4 in Oct 2022  Medication Adjustments/Labs and Tests Ordered: Current medicines are reviewed at length with the patient today.  Concerns regarding medicines are outlined above.  Tests Ordered: Orders Placed This Encounter  Procedures  . EKG 12-Lead   Medication Changes: No orders of the defined types were placed in this encounter.   Disposition:  Follow up in 1 year(s)  Signed, Fransico Him, MD  02/08/2021 9:47 AM    [facility]

## 2021-02-08 NOTE — Patient Instructions (Signed)

## 2021-02-15 DIAGNOSIS — Z1231 Encounter for screening mammogram for malignant neoplasm of breast: Secondary | ICD-10-CM | POA: Diagnosis not present

## 2021-02-20 DIAGNOSIS — M1711 Unilateral primary osteoarthritis, right knee: Secondary | ICD-10-CM | POA: Diagnosis not present

## 2021-02-20 NOTE — Progress Notes (Signed)
Patient Care Team: Seward Carol, MD as PCP - General (Internal Medicine) Sueanne Margarita, MD as PCP - Cardiology (Cardiology) Sylvan Cheese, NP as Nurse Practitioner (Hematology and Oncology)  DIAGNOSIS:    ICD-10-CM   1. Malignant neoplasm of central portion of right breast in female, estrogen receptor positive ([place])  C50.111    Z17.0       SUMMARY OF ONCOLOGIC HISTORY: Oncology History  Malignant neoplasm of central portion of right female breast ([place])  11/08/2015 Mammogram   Right breast 3.2 cm group of pleomorphic calcifications, breast density B, T2 N0 stage II a clinical stage (unclear how much of the calcification is invasive ductal carcinoma versus DCIS)    11/14/2015 Initial Diagnosis   Right breast core biopsy: Small focus of Invasive ductal carcinoma with high-grade DCIS with comedonecrosis, ER 100%, PR 5%, Ki-67 20%, HER-2 negative    01/04/2016 Surgery   Right lumpectomy: Microscopic focus of invasive ductal carcinoma less than 0.1 cm with high-grade DCIS, comedo necrosis, less than 0.1 cm the superior margin, 0/9 LN negative, grade 2, ER 100%, PR 5%, HER-2 negative ratio 1.72, Ki-67 20%, T1 N0 stage IA     02/22/2016 -  Anti-estrogen oral therapy   Anastrozole 1 mg daily switched to letrozole 2.5 mg daily 05/24/2016 due to dizziness and severe hot flashes      CHIEF COMPLIANT: Follow-up of right breast cancer on letrozole  INTERVAL HISTORY: [name] is a 86 y.o. with above-mentioned history of right breast cancer treated with lumpectomy and is currently on oral antiestrogen therapy with letrozole. She presents to the clinic today for annual follow-up.  She reports no new problems or concerns.  She has tolerated letrozole extremely well.  Recent mammograms at [facility] were normal.  ALLERGIES:  is allergic to benadryl [diphenhydramine hcl], lipitor [atorvastatin], morphine and related, rosuvastatin, tramadol, cephalexin, adhesive [tape], cortisone, and  sulfonamide derivatives.  MEDICATIONS:  Current Outpatient Medications  Medication Sig Dispense Refill   acetaminophen (TYLENOL) 500 MG tablet Take 500 mg by mouth.     ALPRAZolam (XANAX) 0.5 MG tablet Take 0.25 mg by mouth daily as needed for sleep. Reported on 02/22/2016     ascorbic acid (VITAMIN C) 500 MG tablet Take 1 tablet by mouth daily.     aspirin EC 81 MG tablet Take 81 mg by mouth daily.     B Complex Vitamins (B COMPLEX PO) Take by mouth.     ezetimibe (ZETIA) 10 MG tablet TAKE ONE (1) TABLET BY MOUTH EACH DAY 90 tablet 3   hydrocortisone 2.5 % cream Apply topically.     letrozole (FEMARA) 2.5 MG tablet Take 1 tablet (2.5 mg total) by mouth daily. 90 tablet 3   levothyroxine (SYNTHROID, LEVOTHROID) 50 MCG tablet Take 50 mcg by mouth daily.     Magnesium Hydroxide (MAGNESIA PO) Take 1 tablet by mouth daily.     mupirocin ointment (BACTROBAN) 2 % Apply topically daily.     pravastatin (PRAVACHOL) 40 MG tablet TAKE 1 TABLET BY MOUTH EVERY EVENING 90 tablet 3   psyllium (HYDROCIL/METAMUCIL) 95 % PACK Take 1 packet by mouth daily.     VITAMIN D PO Take by mouth.     zinc sulfate 220 (50 Zn) MG capsule Take 1 capsule by mouth daily.     No current facility-administered medications for this visit.    PHYSICAL EXAMINATION: ECOG PERFORMANCE STATUS: 1 - Symptomatic but completely ambulatory  Vitals:   02/21/21 1015  BP: Marland Kitchen)  165/68  Pulse: 79  Resp: 18  Temp: (!) 97 F (36.1 C)  SpO2: 99%   Filed Weights   02/21/21 1015  Weight: 165 lb 12.8 oz (75.2 kg)     LABORATORY DATA:  I have reviewed the data as listed CMP Latest Ref Rng & Units 02/02/2020 03/23/2019 02/03/2019  Glucose 65 - 99 mg/dL 86 - -  BUN 8 - 27 mg/dL 20 - -  Creatinine 0.57 - 1.00 mg/dL 0.86 - -  Sodium 134 - 144 mmol/L 141 - -  Potassium 3.5 - 5.2 mmol/L 4.5 - -  Chloride 96 - 106 mmol/L 104 - -  CO2 20 - 29 mmol/L 22 - -  Calcium 8.7 - 10.3 mg/dL 9.5 - -  Total Protein 6.0 - 8.5 g/dL 6.8 6.4 -   Total Bilirubin 0.0 - 1.2 mg/dL 0.5 0.3 -  Alkaline Phos 48 - 121 IU/L 94 81 -  AST 0 - 40 IU/L 21 19 -  ALT 0 - 32 IU/L _0 Lab Results  Component Value Date   WBC 6.1 10/23/2017   HGB 13.4 10/23/2017   HCT 39.4 10/23/2017   MCV 94.6 10/23/2017   PLT 268.0 10/23/2017   NEUTROABS 3.9 10/23/2017    ASSESSMENT & PLAN:  Malignant neoplasm of central portion of right female breast ([place]) Right lumpectomy 01/04/2016: Microscopic focus of invasive ductal carcinoma less than 0.1 cm with high-grade DCIS, comedo necrosis, less than 0.1 cm the superior margin, 0/9 LN negative, grade 2, ER 100%, PR 5%, HER-2 negative ratio 1.72, Ki-67 20%, T1 N0 stage IA Patient did not get adjuvant radiation therapy because of minimal microscopic invasive cancer as well as very sensitive skin   Current treatment: Anastrozole 1 mg daily started June 2017, changed to letrozole 2.5 mg daily 05/24/2016 due to dizziness and hot flashes I recommended discontinuation of antiestrogen therapy at this time having completed 5 years of treatment.     Surveillance: 1. Mammograms at [facility] June 2022: benign.  Breast density category B 2. breast exam 02/22/2019: Benign    She helps take care of her of her husband who has mobility issues.  Return to clinic on an as-needed basis    No orders of the defined types were placed in this encounter.  The patient has a good understanding of the overall plan. she agrees with it. she will call with any problems that may develop before the next visit here.  Total time spent: 20 mins including face to face time and time spent for planning, charting and coordination of care  Rulon Eisenmenger, MD, MPH 02/21/2021  I, Molly Dorshimer, am acting as scribe for Dr. Nicholas Lose.  I have reviewed the above documentation for accuracy and completeness, and I agree with the above.

## 2021-02-21 ENCOUNTER — Other Ambulatory Visit: Payer: Self-pay | Admitting: Cardiology

## 2021-02-21 ENCOUNTER — Inpatient Hospital Stay: Payer: Medicare Other | Attending: Hematology and Oncology | Admitting: Hematology and Oncology

## 2021-02-21 ENCOUNTER — Other Ambulatory Visit: Payer: Self-pay

## 2021-02-21 DIAGNOSIS — R232 Flushing: Secondary | ICD-10-CM | POA: Insufficient documentation

## 2021-02-21 DIAGNOSIS — Z79811 Long term (current) use of aromatase inhibitors: Secondary | ICD-10-CM | POA: Insufficient documentation

## 2021-02-21 DIAGNOSIS — Z7982 Long term (current) use of aspirin: Secondary | ICD-10-CM | POA: Insufficient documentation

## 2021-02-21 DIAGNOSIS — Z170 Estrogen receptor positive status [ER+]: Secondary | ICD-10-CM | POA: Diagnosis not present

## 2021-02-21 DIAGNOSIS — C50111 Malignant neoplasm of central portion of right female breast: Secondary | ICD-10-CM | POA: Insufficient documentation

## 2021-02-21 DIAGNOSIS — Z79899 Other long term (current) drug therapy: Secondary | ICD-10-CM | POA: Diagnosis not present

## 2021-02-21 NOTE — Assessment & Plan Note (Signed)
Right lumpectomy 01/04/2016: Microscopic focus of invasive ductal carcinoma less than 0.1 cm with high-grade DCIS, comedo necrosis, less than 0.1 cm the superior margin, 0/9 LN negative, grade 2, ER 100%, PR 5%, HER-2 negative ratio 1.72, Ki-67 20%, T1 N0 stage IA Patient did not get adjuvant radiation therapy because of minimal microscopic invasive cancer as well as very sensitive skin  Current treatment: Anastrozole 1 mg daily started June 2017, changedto letrozole 2.5 mg daily 05/24/2016 due to dizziness and hot flashes  Letrozole toxicities: Denies any hot flashes or myalgias. I instructed her to obtain a bone density test when she goes to see her primary care physician in August.  Surveillance: 1.Mammograms at [facility]  02/16/2020: benign.  Breast density category B 2.breast exam 02/22/2019: Benign  Return to clinic in1 yearfor follow-up.

## 2021-03-29 DIAGNOSIS — R350 Frequency of micturition: Secondary | ICD-10-CM | POA: Diagnosis not present

## 2021-04-03 DIAGNOSIS — L720 Epidermal cyst: Secondary | ICD-10-CM | POA: Diagnosis not present

## 2021-04-03 DIAGNOSIS — L578 Other skin changes due to chronic exposure to nonionizing radiation: Secondary | ICD-10-CM | POA: Diagnosis not present

## 2021-04-03 DIAGNOSIS — Z85828 Personal history of other malignant neoplasm of skin: Secondary | ICD-10-CM | POA: Diagnosis not present

## 2021-04-03 DIAGNOSIS — D225 Melanocytic nevi of trunk: Secondary | ICD-10-CM | POA: Diagnosis not present

## 2021-04-03 DIAGNOSIS — L304 Erythema intertrigo: Secondary | ICD-10-CM | POA: Diagnosis not present

## 2021-04-03 DIAGNOSIS — L570 Actinic keratosis: Secondary | ICD-10-CM | POA: Diagnosis not present

## 2021-04-03 DIAGNOSIS — L820 Inflamed seborrheic keratosis: Secondary | ICD-10-CM | POA: Diagnosis not present

## 2021-05-19 ENCOUNTER — Other Ambulatory Visit (HOSPITAL_COMMUNITY): Payer: Self-pay | Admitting: Sports Medicine

## 2021-05-19 ENCOUNTER — Other Ambulatory Visit: Payer: Self-pay

## 2021-05-19 ENCOUNTER — Ambulatory Visit (HOSPITAL_COMMUNITY): Payer: Medicare Other

## 2021-05-19 ENCOUNTER — Ambulatory Visit (HOSPITAL_COMMUNITY)
Admission: RE | Admit: 2021-05-19 | Discharge: 2021-05-19 | Disposition: A | Discharge status: Home / Self Care | Payer: Medicare Other | Source: Ambulatory Visit | Attending: Sports Medicine | Admitting: Sports Medicine

## 2021-05-19 DIAGNOSIS — M7989 Other specified soft tissue disorders: Secondary | ICD-10-CM | POA: Diagnosis not present

## 2021-05-19 DIAGNOSIS — M79605 Pain in left leg: Secondary | ICD-10-CM

## 2021-05-19 DIAGNOSIS — M25562 Pain in left knee: Secondary | ICD-10-CM | POA: Diagnosis not present

## 2021-05-24 DIAGNOSIS — S86112A Strain of other muscle(s) and tendon(s) of posterior muscle group at lower leg level, left leg, initial encounter: Secondary | ICD-10-CM | POA: Diagnosis not present

## 2021-06-07 DIAGNOSIS — M1711 Unilateral primary osteoarthritis, right knee: Secondary | ICD-10-CM | POA: Diagnosis not present

## 2021-07-04 DIAGNOSIS — E7800 Pure hypercholesterolemia, unspecified: Secondary | ICD-10-CM | POA: Diagnosis not present

## 2021-07-04 DIAGNOSIS — Z23 Encounter for immunization: Secondary | ICD-10-CM | POA: Diagnosis not present

## 2021-07-04 DIAGNOSIS — M8580 Other specified disorders of bone density and structure, unspecified site: Secondary | ICD-10-CM | POA: Diagnosis not present

## 2021-07-04 DIAGNOSIS — R269 Unspecified abnormalities of gait and mobility: Secondary | ICD-10-CM | POA: Diagnosis not present

## 2021-07-04 DIAGNOSIS — E039 Hypothyroidism, unspecified: Secondary | ICD-10-CM | POA: Diagnosis not present

## 2021-07-04 DIAGNOSIS — G4700 Insomnia, unspecified: Secondary | ICD-10-CM | POA: Diagnosis not present

## 2021-07-04 DIAGNOSIS — I25119 Atherosclerotic heart disease of native coronary artery with unspecified angina pectoris: Secondary | ICD-10-CM | POA: Diagnosis not present

## 2021-07-04 DIAGNOSIS — C50111 Malignant neoplasm of central portion of right female breast: Secondary | ICD-10-CM | POA: Diagnosis not present

## 2021-07-07 DIAGNOSIS — M1711 Unilateral primary osteoarthritis, right knee: Secondary | ICD-10-CM | POA: Diagnosis not present

## 2021-07-07 DIAGNOSIS — S86112A Strain of other muscle(s) and tendon(s) of posterior muscle group at lower leg level, left leg, initial encounter: Secondary | ICD-10-CM | POA: Diagnosis not present

## 2021-07-07 DIAGNOSIS — M25561 Pain in right knee: Secondary | ICD-10-CM | POA: Diagnosis not present

## 2021-07-24 DIAGNOSIS — M85852 Other specified disorders of bone density and structure, left thigh: Secondary | ICD-10-CM | POA: Diagnosis not present

## 2021-07-24 DIAGNOSIS — M810 Age-related osteoporosis without current pathological fracture: Secondary | ICD-10-CM | POA: Diagnosis not present

## 2021-07-24 DIAGNOSIS — Z780 Asymptomatic menopausal state: Secondary | ICD-10-CM | POA: Diagnosis not present

## 2021-08-02 ENCOUNTER — Ambulatory Visit: Payer: Medicare Other | Admitting: Gastroenterology

## 2021-08-23 ENCOUNTER — Ambulatory Visit: Payer: Medicare Other | Admitting: Gastroenterology

## 2021-08-23 ENCOUNTER — Encounter: Payer: Self-pay | Admitting: Gastroenterology

## 2021-08-23 VITALS — BP 140/70 | HR 88 | Ht 61.5 in | Wt 168.4 lb

## 2021-08-23 DIAGNOSIS — K589 Irritable bowel syndrome without diarrhea: Secondary | ICD-10-CM | POA: Diagnosis not present

## 2021-08-23 DIAGNOSIS — K649 Unspecified hemorrhoids: Secondary | ICD-10-CM | POA: Diagnosis not present

## 2021-08-23 NOTE — Patient Instructions (Signed)
If you are age 84 or older, your body mass index should be between 23-30. Your Body mass index is 31.3 kg/m. If this is out of the aforementioned range listed, please consider follow up with your Primary Care Provider. ________________________________________________________  The [facility] providers would like to encourage you to use [facility] to communicate with providers for non-urgent requests or questions.  Due to long hold times on the telephone, sending your provider a message by [facility] may be a faster and more efficient way to get a response.  Please allow 48 business hours for a response.  Please remember that this is for non-urgent requests.  _______________________________________________________  Please continue Miralax and Metamucil daily.  Thank you for entrusting me with your care and choosing [facility].  Dr Ardis Hughs

## 2021-08-23 NOTE — Progress Notes (Signed)
Review of pertinent gastrointestinal problems: 1.  Precancerous colon polyps.  Colonoscopy April 2013 single subcentimeter adenoma was removed.  She declined repeat colonoscopy at time of office visit February 2019.   HPI: This is a very pleasant 86 year old woman who is the wife of a patient that I see quite frequently, Golden Pop.  She was here at the time of one of his visits recently and mentions some hemorrhoid problems and we set this appointment up.  She was last here as a patient in our office almost 4 years ago when she saw Janett Billow, February 2019.  This was for sudden onset diarrhea and abdominal pain.  Her symptoms resolved after a week.  Decision was made to observe her clinically and we have not heard from her since.  Her weight is up 4 pounds since she was last here in our office, same scale.  She has had issues with hemorrhoid discomfort.  They can hurt at times.  They never has bleeding.  She was having do sitz bath's frequently.  I recommended that she try Metamucil and MiraLAX.  Metamucil twice daily and MiraLAX once daily.  She has been doing this and is "much much better".  She has 2-3 bowel movements a day.  She does have mild lower abdominal discomforts which always improved with bowel movements.  She sometimes will put a heating pad on that can help also.    Review of systems: Pertinent positive and negative review of systems were noted in the above HPI section. All other review negative.   Past Medical History:  Diagnosis Date   Anxiety    Arthritis    Bladder cancer ([place]) 2012   Breast cancer ([place])    CAD (coronary artery disease) 2/10   S/P PCI (Stent) of LAD. Normal LVF-No angina   Cancer of central portion of female breast, right 11/22/2015   Cataract    left eye   Colonic stricture ([place]) 2013   and colon adenoma   Constipation    predominant IBS-miralax and citrucel daily-has used linzess in the past. Dr Macky Lower)   Diverticulosis of colon (without  mention of hemorrhage)    External hemorrhoids without mention of complication    Hyperlipidemia    IBS (irritable bowel syndrome)    Insomnia    MSSA (methicillin susceptible Staphylococcus aureus) 2011   buttock abscess   Osteopenia    mild   Phlebitis    PONV (postoperative nausea and vomiting)    Shingles    Squamous cell carcinoma, leg 2012   right, left   Thyroid disease    hypothyroidism   Vitamin D insufficiency    Dr. Joan Flores    Past Surgical History:  Procedure Laterality Date   Bladder cancer surgery  2012   Dr Dalhstedt   CORONARY ANGIOPLASTY WITH STENT PLACEMENT  10/2008   HEMORRHOID SURGERY     KNEE SURGERY     Age 40-30   Left elbow surgery x4     RADIOACTIVE SEED GUIDED PARTIAL MASTECTOMY WITH AXILLARY SENTINEL LYMPH NODE BIOPSY Right 01/04/2016   Procedure: RADIOACTIVE SEED GUIDED PARTIAL MASTECTOMY WITH AXILLARY SENTINEL LYMPH NODE BIOPSY;  Surgeon: Autumn Messing III, MD;  Location: [place];  Service: General;  Laterality: Right;   TONSILLECTOMY     TOTAL KNEE ARTHROPLASTY  06/2000   Left    Current Outpatient Medications  Medication Sig Dispense Refill   acetaminophen (TYLENOL) 500 MG tablet Take 500 mg by mouth.     ALPRAZolam (XANAX) 0.5  MG tablet Take 0.25 mg by mouth daily as needed for sleep. Reported on 02/22/2016     ascorbic acid (VITAMIN C) 500 MG tablet Take 1 tablet by mouth daily.     aspirin EC 81 MG tablet Take 81 mg by mouth daily.     B Complex Vitamins (B COMPLEX PO) Take by mouth.     ezetimibe (ZETIA) 10 MG tablet TAKE ONE (1) TABLET BY MOUTH EACH DAY 90 tablet 3   hydrocortisone 2.5 % cream Apply topically.     levothyroxine (SYNTHROID, LEVOTHROID) 50 MCG tablet Take 50 mcg by mouth daily.     Magnesium Hydroxide (MAGNESIA PO) Take 1 tablet by mouth daily.     mupirocin ointment (BACTROBAN) 2 % Apply topically daily.     pravastatin (PRAVACHOL) 40 MG tablet TAKE 1 TABLET BY MOUTH EVERY EVENING 90 tablet 3   psyllium (HYDROCIL/METAMUCIL) 95  % PACK Take 1 packet by mouth daily.     VITAMIN D PO Take by mouth.     zinc sulfate 220 (50 Zn) MG capsule Take 1 capsule by mouth daily.     No current facility-administered medications for this visit.    Allergies as of 08/23/2021 - Review Complete 08/23/2021  Allergen Reaction Noted   Benadryl [diphenhydramine hcl] Other (See Comments) 01/07/2012   Crestor [rosuvastatin] Other (See Comments) 10/13/2013   Lipitor [atorvastatin] Other (See Comments) 10/13/2013   Morphine and related Nausea And Vomiting 01/02/2012   Tramadol Nausea And Vomiting and Other (See Comments) 01/07/2012   Keflex [cephalexin]  09/13/2020   Adhesive [tape] Other (See Comments) 12/22/2015   Cortisone Rash 10/13/2013   Sulfonamide derivatives Hives 07/27/2008    Family History  Problem Relation Age of Onset   Heart disease Father 12       Died of MI   Heart attack Father 46   Hiatal hernia Sister    Breast cancer Sister 52   Ulcers Mother        peptic   Alzheimer's disease Mother    Hypertension Brother    Lung cancer Brother    Breast cancer Other        aunt   Coronary artery disease Son 51       MI   Stroke Son    Coronary artery disease Paternal Uncle 63       Died with CAD   Diabetes Maternal Grandmother    Heart attack Maternal Grandfather        70's   Colon cancer Neg Hx    Esophageal cancer Neg Hx    Rectal cancer Neg Hx    Stomach cancer Neg Hx     Social History   Socioeconomic History   Marital status: Married    Spouse name: Not on file   Number of children: 2   Years of education: Not on file   Highest education level: Not on file  Occupational History   Occupation: Retired  Tobacco Use   Smoking status: Former    Types: Cigarettes    Quit date: 09/10/1984    Years since quitting: 36.9   Smokeless tobacco: Never   Tobacco comments:    Quit tobacco 30 years ago.  Vaping Use   Vaping Use: Never used  Substance and Sexual Activity   Alcohol use: Yes     Alcohol/week: 7.0 standard drinks    Types: 7 Glasses of wine per week    Comment: wine   Drug use: No   Sexual  activity: Not Currently    Partners: Male    Birth control/protection: Post-menopausal    Comment: husband vasectomy  Other Topics Concern   Not on file  Social History Narrative   Lives at home with husband.     Social Determinants of Health   Financial Resource Strain: Not on file  Food Insecurity: Not on file  Transportation Needs: Not on file  Physical Activity: Not on file  Stress: Not on file  Social Connections: Not on file  Intimate Partner Violence: Not on file     Physical Exam: Ht 5' 1.5" (1.562 m)    Wt 168 lb 6 oz (76.4 kg)    BMI 31.30 kg/m  Constitutional: generally well-appearing Psychiatric: alert and oriented x3 Eyes: extraocular movements intact Mouth: oral pharynx moist, no lesions Neck: supple no lymphadenopathy Cardiovascular: heart regular rate and rhythm Lungs: clear to auscultation bilaterally Abdomen: soft, nontender, nondistended, no obvious ascites, no peritoneal signs, normal bowel sounds Extremities: no lower extremity edema bilaterally Skin: no lesions on visible extremities Rectal exam declined per patient  Assessment and plan: 86 y.o. female with symptomatic hemorrhoids  Her hemorrhoids were hurting but are much much better since she started taking Metamucil and MiraLAX.  I recommended rectal examination today but she declined.  I recommended that she continue taking the Metamucil and MiraLAX on an every day basis.  She asked about potential liver injury because of the fact that she takes 2 extra Tylenol twice daily for orthopedic related pains.  I told her it was unlikely at that dose even though she drinks 1 small glass of wine every night.  I offered and recommended blood tests with LFTs to be more certain however she declined.  She will follow-up as needed.  Please see the "Patient Instructions" section for addition  details about the plan.   Owens Loffler, MD [facility] 08/23/2021, 10:21 AM  Cc: Seward Carol, MD  Total time on date of encounter was 45  minutes (this included time spent preparing to see the patient reviewing records; obtaining and/or reviewing separately obtained history; performing a medically appropriate exam and/or evaluation; counseling and educating the patient and family if present; ordering medications, tests or procedures if applicable; and documenting clinical information in the health record).

## 2021-08-28 DIAGNOSIS — H02834 Dermatochalasis of left upper eyelid: Secondary | ICD-10-CM | POA: Diagnosis not present

## 2021-08-28 DIAGNOSIS — H26493 Other secondary cataract, bilateral: Secondary | ICD-10-CM | POA: Diagnosis not present

## 2021-08-28 DIAGNOSIS — H02831 Dermatochalasis of right upper eyelid: Secondary | ICD-10-CM | POA: Diagnosis not present

## 2021-08-28 DIAGNOSIS — Z961 Presence of intraocular lens: Secondary | ICD-10-CM | POA: Diagnosis not present

## 2021-08-28 DIAGNOSIS — H10413 Chronic giant papillary conjunctivitis, bilateral: Secondary | ICD-10-CM | POA: Diagnosis not present

## 2021-08-28 DIAGNOSIS — H43811 Vitreous degeneration, right eye: Secondary | ICD-10-CM | POA: Diagnosis not present

## 2021-08-28 DIAGNOSIS — H04123 Dry eye syndrome of bilateral lacrimal glands: Secondary | ICD-10-CM | POA: Diagnosis not present

## 2021-09-11 DIAGNOSIS — M79605 Pain in left leg: Secondary | ICD-10-CM | POA: Diagnosis not present

## 2021-09-11 DIAGNOSIS — R262 Difficulty in walking, not elsewhere classified: Secondary | ICD-10-CM | POA: Diagnosis not present

## 2021-09-11 DIAGNOSIS — S86112D Strain of other muscle(s) and tendon(s) of posterior muscle group at lower leg level, left leg, subsequent encounter: Secondary | ICD-10-CM | POA: Diagnosis not present

## 2021-09-13 DIAGNOSIS — M79605 Pain in left leg: Secondary | ICD-10-CM | POA: Diagnosis not present

## 2021-09-13 DIAGNOSIS — S86112D Strain of other muscle(s) and tendon(s) of posterior muscle group at lower leg level, left leg, subsequent encounter: Secondary | ICD-10-CM | POA: Diagnosis not present

## 2021-09-13 DIAGNOSIS — R262 Difficulty in walking, not elsewhere classified: Secondary | ICD-10-CM | POA: Diagnosis not present

## 2021-09-15 DIAGNOSIS — R262 Difficulty in walking, not elsewhere classified: Secondary | ICD-10-CM | POA: Diagnosis not present

## 2021-09-15 DIAGNOSIS — M79605 Pain in left leg: Secondary | ICD-10-CM | POA: Diagnosis not present

## 2021-09-15 DIAGNOSIS — S86112D Strain of other muscle(s) and tendon(s) of posterior muscle group at lower leg level, left leg, subsequent encounter: Secondary | ICD-10-CM | POA: Diagnosis not present

## 2021-09-18 DIAGNOSIS — M79605 Pain in left leg: Secondary | ICD-10-CM | POA: Diagnosis not present

## 2021-09-18 DIAGNOSIS — R262 Difficulty in walking, not elsewhere classified: Secondary | ICD-10-CM | POA: Diagnosis not present

## 2021-09-18 DIAGNOSIS — S86112D Strain of other muscle(s) and tendon(s) of posterior muscle group at lower leg level, left leg, subsequent encounter: Secondary | ICD-10-CM | POA: Diagnosis not present

## 2021-09-20 DIAGNOSIS — R262 Difficulty in walking, not elsewhere classified: Secondary | ICD-10-CM | POA: Diagnosis not present

## 2021-09-20 DIAGNOSIS — S86112D Strain of other muscle(s) and tendon(s) of posterior muscle group at lower leg level, left leg, subsequent encounter: Secondary | ICD-10-CM | POA: Diagnosis not present

## 2021-09-20 DIAGNOSIS — M79605 Pain in left leg: Secondary | ICD-10-CM | POA: Diagnosis not present

## 2021-09-22 DIAGNOSIS — S86112D Strain of other muscle(s) and tendon(s) of posterior muscle group at lower leg level, left leg, subsequent encounter: Secondary | ICD-10-CM | POA: Diagnosis not present

## 2021-09-22 DIAGNOSIS — R262 Difficulty in walking, not elsewhere classified: Secondary | ICD-10-CM | POA: Diagnosis not present

## 2021-09-22 DIAGNOSIS — M79605 Pain in left leg: Secondary | ICD-10-CM | POA: Diagnosis not present

## 2021-09-27 DIAGNOSIS — R262 Difficulty in walking, not elsewhere classified: Secondary | ICD-10-CM | POA: Diagnosis not present

## 2021-09-27 DIAGNOSIS — S86112D Strain of other muscle(s) and tendon(s) of posterior muscle group at lower leg level, left leg, subsequent encounter: Secondary | ICD-10-CM | POA: Diagnosis not present

## 2021-09-27 DIAGNOSIS — M79605 Pain in left leg: Secondary | ICD-10-CM | POA: Diagnosis not present

## 2021-09-29 DIAGNOSIS — S86112D Strain of other muscle(s) and tendon(s) of posterior muscle group at lower leg level, left leg, subsequent encounter: Secondary | ICD-10-CM | POA: Diagnosis not present

## 2021-09-29 DIAGNOSIS — R262 Difficulty in walking, not elsewhere classified: Secondary | ICD-10-CM | POA: Diagnosis not present

## 2021-09-29 DIAGNOSIS — M79605 Pain in left leg: Secondary | ICD-10-CM | POA: Diagnosis not present

## 2021-10-06 DIAGNOSIS — R262 Difficulty in walking, not elsewhere classified: Secondary | ICD-10-CM | POA: Diagnosis not present

## 2021-10-06 DIAGNOSIS — S86112D Strain of other muscle(s) and tendon(s) of posterior muscle group at lower leg level, left leg, subsequent encounter: Secondary | ICD-10-CM | POA: Diagnosis not present

## 2021-10-06 DIAGNOSIS — M79605 Pain in left leg: Secondary | ICD-10-CM | POA: Diagnosis not present

## 2021-10-09 DIAGNOSIS — M79605 Pain in left leg: Secondary | ICD-10-CM | POA: Diagnosis not present

## 2021-10-09 DIAGNOSIS — S86112D Strain of other muscle(s) and tendon(s) of posterior muscle group at lower leg level, left leg, subsequent encounter: Secondary | ICD-10-CM | POA: Diagnosis not present

## 2021-10-09 DIAGNOSIS — R262 Difficulty in walking, not elsewhere classified: Secondary | ICD-10-CM | POA: Diagnosis not present

## 2021-10-11 DIAGNOSIS — R262 Difficulty in walking, not elsewhere classified: Secondary | ICD-10-CM | POA: Diagnosis not present

## 2021-10-11 DIAGNOSIS — S86112D Strain of other muscle(s) and tendon(s) of posterior muscle group at lower leg level, left leg, subsequent encounter: Secondary | ICD-10-CM | POA: Diagnosis not present

## 2021-10-11 DIAGNOSIS — M79605 Pain in left leg: Secondary | ICD-10-CM | POA: Diagnosis not present

## 2021-10-13 DIAGNOSIS — R262 Difficulty in walking, not elsewhere classified: Secondary | ICD-10-CM | POA: Diagnosis not present

## 2021-10-13 DIAGNOSIS — S86112D Strain of other muscle(s) and tendon(s) of posterior muscle group at lower leg level, left leg, subsequent encounter: Secondary | ICD-10-CM | POA: Diagnosis not present

## 2021-10-13 DIAGNOSIS — M79605 Pain in left leg: Secondary | ICD-10-CM | POA: Diagnosis not present

## 2021-10-17 DIAGNOSIS — R262 Difficulty in walking, not elsewhere classified: Secondary | ICD-10-CM | POA: Diagnosis not present

## 2021-10-17 DIAGNOSIS — M79605 Pain in left leg: Secondary | ICD-10-CM | POA: Diagnosis not present

## 2021-10-17 DIAGNOSIS — S86112D Strain of other muscle(s) and tendon(s) of posterior muscle group at lower leg level, left leg, subsequent encounter: Secondary | ICD-10-CM | POA: Diagnosis not present

## 2021-10-19 DIAGNOSIS — S86112D Strain of other muscle(s) and tendon(s) of posterior muscle group at lower leg level, left leg, subsequent encounter: Secondary | ICD-10-CM | POA: Diagnosis not present

## 2021-10-19 DIAGNOSIS — R262 Difficulty in walking, not elsewhere classified: Secondary | ICD-10-CM | POA: Diagnosis not present

## 2021-10-19 DIAGNOSIS — M79605 Pain in left leg: Secondary | ICD-10-CM | POA: Diagnosis not present

## 2021-10-24 DIAGNOSIS — M79605 Pain in left leg: Secondary | ICD-10-CM | POA: Diagnosis not present

## 2021-10-24 DIAGNOSIS — S86112D Strain of other muscle(s) and tendon(s) of posterior muscle group at lower leg level, left leg, subsequent encounter: Secondary | ICD-10-CM | POA: Diagnosis not present

## 2021-10-24 DIAGNOSIS — R262 Difficulty in walking, not elsewhere classified: Secondary | ICD-10-CM | POA: Diagnosis not present

## 2021-10-25 DIAGNOSIS — R262 Difficulty in walking, not elsewhere classified: Secondary | ICD-10-CM | POA: Diagnosis not present

## 2021-10-25 DIAGNOSIS — M79605 Pain in left leg: Secondary | ICD-10-CM | POA: Diagnosis not present

## 2021-10-25 DIAGNOSIS — S86112D Strain of other muscle(s) and tendon(s) of posterior muscle group at lower leg level, left leg, subsequent encounter: Secondary | ICD-10-CM | POA: Diagnosis not present

## 2021-10-31 DIAGNOSIS — M79605 Pain in left leg: Secondary | ICD-10-CM | POA: Diagnosis not present

## 2021-10-31 DIAGNOSIS — R262 Difficulty in walking, not elsewhere classified: Secondary | ICD-10-CM | POA: Diagnosis not present

## 2021-10-31 DIAGNOSIS — S86112D Strain of other muscle(s) and tendon(s) of posterior muscle group at lower leg level, left leg, subsequent encounter: Secondary | ICD-10-CM | POA: Diagnosis not present

## 2021-11-02 DIAGNOSIS — R262 Difficulty in walking, not elsewhere classified: Secondary | ICD-10-CM | POA: Diagnosis not present

## 2021-11-02 DIAGNOSIS — S86112D Strain of other muscle(s) and tendon(s) of posterior muscle group at lower leg level, left leg, subsequent encounter: Secondary | ICD-10-CM | POA: Diagnosis not present

## 2021-11-02 DIAGNOSIS — M79605 Pain in left leg: Secondary | ICD-10-CM | POA: Diagnosis not present

## 2021-11-06 DIAGNOSIS — C678 Malignant neoplasm of overlapping sites of bladder: Secondary | ICD-10-CM | POA: Diagnosis not present

## 2021-11-07 DIAGNOSIS — M79605 Pain in left leg: Secondary | ICD-10-CM | POA: Diagnosis not present

## 2021-11-07 DIAGNOSIS — S86112D Strain of other muscle(s) and tendon(s) of posterior muscle group at lower leg level, left leg, subsequent encounter: Secondary | ICD-10-CM | POA: Diagnosis not present

## 2021-11-07 DIAGNOSIS — R262 Difficulty in walking, not elsewhere classified: Secondary | ICD-10-CM | POA: Diagnosis not present

## 2021-11-20 DIAGNOSIS — M1711 Unilateral primary osteoarthritis, right knee: Secondary | ICD-10-CM | POA: Diagnosis not present

## 2021-11-20 DIAGNOSIS — M1712 Unilateral primary osteoarthritis, left knee: Secondary | ICD-10-CM | POA: Diagnosis not present

## 2021-11-23 DIAGNOSIS — L570 Actinic keratosis: Secondary | ICD-10-CM | POA: Diagnosis not present

## 2021-12-04 ENCOUNTER — Other Ambulatory Visit: Payer: Self-pay | Admitting: Cardiology

## 2021-12-04 NOTE — Telephone Encounter (Signed)
Please call our to schedule an appointment w/ Dr. Radford Pax for 02/2022 for any future refills Thank you. [phone]. ? ?

## 2021-12-21 DIAGNOSIS — N390 Urinary tract infection, site not specified: Secondary | ICD-10-CM | POA: Diagnosis not present

## 2022-01-04 DIAGNOSIS — L570 Actinic keratosis: Secondary | ICD-10-CM | POA: Diagnosis not present

## 2022-02-08 DIAGNOSIS — M25522 Pain in left elbow: Secondary | ICD-10-CM | POA: Diagnosis not present

## 2022-02-08 DIAGNOSIS — M19022 Primary osteoarthritis, left elbow: Secondary | ICD-10-CM | POA: Diagnosis not present

## 2022-02-16 DIAGNOSIS — M1711 Unilateral primary osteoarthritis, right knee: Secondary | ICD-10-CM | POA: Diagnosis not present

## 2022-03-01 DIAGNOSIS — Z1231 Encounter for screening mammogram for malignant neoplasm of breast: Secondary | ICD-10-CM | POA: Diagnosis not present

## 2022-03-09 DIAGNOSIS — L03112 Cellulitis of left axilla: Secondary | ICD-10-CM | POA: Diagnosis not present

## 2022-03-13 ENCOUNTER — Other Ambulatory Visit: Payer: Self-pay | Admitting: Cardiology

## 2022-03-14 ENCOUNTER — Other Ambulatory Visit: Payer: Self-pay | Admitting: Cardiology

## 2022-03-15 DIAGNOSIS — S5002XA Contusion of left elbow, initial encounter: Secondary | ICD-10-CM | POA: Diagnosis not present

## 2022-03-21 DIAGNOSIS — L03116 Cellulitis of left lower limb: Secondary | ICD-10-CM | POA: Diagnosis not present

## 2022-03-29 ENCOUNTER — Ambulatory Visit: Payer: Medicare Other | Admitting: Cardiology

## 2022-03-29 NOTE — Progress Notes (Signed)
Office Visit    Patient Name: [name] Date of Encounter: 03/30/2022  PCP:  Seward Carol, [place]  Cardiologist:  Fransico Him, MD  Advanced Practice Provider:  No care team member to display Electrophysiologist:  None   HPI    [name] is a 87 y.o. female with a hx of CAD status post PCI of the LAD in 2010, hypertension, and hyperlipidemia presents today for routine follow-up visit.  She was last seen 02/2021 by Dr. Radford Pax.  She had some mild DOE that she thought was stable at the time.  She does have a relatively sedentary state with lack of exercise.  She does do some occasional chair yoga.  She denied any chest pain or pressure at that time.  She had chronic LE edema which was stable.  She was compliant with her medications.  Today, she denies any chest pain or shortness of breath.  She states that she has been less active due to taking care of her husband and his recent stroke.  She does have chronic lower extremity edema.  She would like to try a lower dose of her pravastatin since she does have a lot of joint problems.  Otherwise, she is doing well today.  Reports no shortness of breath nor dyspnea on exertion. Reports no chest pain, pressure, or tightness. No new edema, orthopnea, PND. Reports no palpitations.    Past Medical History    Past Medical History:  Diagnosis Date   Anxiety    Arthritis    Bladder cancer ([place]) 2012   Breast cancer ([place])    CAD (coronary artery disease) 2/10   S/P PCI (Stent) of LAD. Normal LVF-No angina   Cancer of central portion of female breast, right 11/22/2015   Cataract    left eye   Colonic stricture ([place]) 2013   and colon adenoma   Constipation    predominant IBS-miralax and citrucel daily-has used linzess in the past. Dr Macky Lower)   Diverticulosis of colon (without mention of hemorrhage)    External hemorrhoids without mention of complication    Hyperlipidemia    IBS (irritable bowel  syndrome)    Insomnia    MSSA (methicillin susceptible Staphylococcus aureus) 2011   buttock abscess   Osteopenia    mild   Phlebitis    PONV (postoperative nausea and vomiting)    Shingles    Squamous cell carcinoma, leg 2012   right, left   Thyroid disease    hypothyroidism   Vitamin D insufficiency    Dr. Joan Flores   Past Surgical History:  Procedure Laterality Date   Bladder cancer surgery  09/10/2010   Dr Dalhstedt   CORONARY ANGIOPLASTY WITH STENT PLACEMENT  10/11/2008   HEMORRHOID SURGERY     KNEE ARTHROSCOPY Left    Age 49-30   Left elbow surgery x4     RADIOACTIVE SEED GUIDED PARTIAL MASTECTOMY WITH AXILLARY SENTINEL LYMPH NODE BIOPSY Right 01/04/2016   Procedure: RADIOACTIVE SEED GUIDED PARTIAL MASTECTOMY WITH AXILLARY SENTINEL LYMPH NODE BIOPSY;  Surgeon: Autumn Messing III, MD;  Location: [facility];  Service: General;  Laterality: Right;   TONSILLECTOMY     TOTAL KNEE ARTHROPLASTY  06/10/2000   Left    Allergies  Allergies  Allergen Reactions   Benadryl [Diphenhydramine Hcl] Other (See Comments)    hyperactivity   Crestor [Rosuvastatin] Other (See Comments)    Muscle Aches Other reaction(s): muscle aches   Lipitor [Atorvastatin] Other (  See Comments)    Muscle aches   Morphine And Related Nausea And Vomiting   Tramadol Nausea And Vomiting and Other (See Comments)   Keflex [Cephalexin]     Other reaction(s): n/v abd pain   Adhesive [Tape] Other (See Comments)    Thin and sensitive skin, please use "paper" tape   Cortisone Rash   Sulfonamide Derivatives Hives     EKGs/Labs/Other Studies Reviewed:   The following studies were reviewed today:  Labs listed below.  EKG:  EKG is  ordered today.  The ekg ordered today demonstrates NSR, rate 85 bpm  Recent Labs: No results found for requested labs within last 365 days.  Recent Lipid Panel    Component Value Date/Time   CHOL 169 02/02/2020 0917   CHOL 164 06/21/2014 0829   TRIG 142 02/02/2020 0917   TRIG 115  06/21/2014 0829   HDL 75 02/02/2020 0917   HDL 78 06/21/2014 0829   CHOLHDL 2.3 02/02/2020 0917   CHOLHDL 1.8 01/25/2016 0826   VLDL 17 01/25/2016 0826   LDLCALC 70 02/02/2020 0917   LDLCALC 63 06/21/2014 0829     Home Medications   Current Meds  Medication Sig   acetaminophen (TYLENOL) 500 MG tablet Take 500 mg by mouth.   ALPRAZolam (XANAX) 0.5 MG tablet Take 0.25 mg by mouth daily as needed for sleep. Reported on 02/22/2016   ascorbic acid (VITAMIN C) 500 MG tablet Take 1 tablet by mouth daily.   aspirin EC 81 MG tablet Take 81 mg by mouth daily.   B Complex Vitamins (B COMPLEX PO) Take by mouth.   ezetimibe (ZETIA) 10 MG tablet TAKE ONE (1) TABLET BY MOUTH EACH DAY   hydrocortisone 2.5 % cream Apply topically.   levothyroxine (SYNTHROID, LEVOTHROID) 50 MCG tablet Take 50 mcg by mouth daily.   Magnesium Hydroxide (MAGNESIA PO) Take 1 tablet by mouth daily.   mupirocin ointment (BACTROBAN) 2 % Apply topically daily.   polyethylene glycol (MIRALAX / GLYCOLAX) 17 g packet Take 17 g by mouth daily.   psyllium (HYDROCIL/METAMUCIL) 95 % PACK Take 1 packet by mouth daily.   VITAMIN D PO Take by mouth.   zinc sulfate 220 (50 Zn) MG capsule Take 1 capsule by mouth as needed.   [DISCONTINUED] pravastatin (PRAVACHOL) 40 MG tablet TAKE 1 TABLET BY MOUTH EVERY EVENING     Review of Systems      All other systems reviewed and are otherwise negative except as noted above.  Physical Exam    VS:  BP 140/80   Pulse 85   Ht 5' 1.5" (1.562 m)   Wt 161 lb (73 kg)   SpO2 95%   BMI 29.93 kg/m  , BMI Body mass index is 29.93 kg/m.  Wt Readings from Last 3 Encounters:  03/30/22 161 lb (73 kg)  08/23/21 168 lb 6 oz (76.4 kg)  02/21/21 165 lb 12.8 oz (75.2 kg)     GEN: Well nourished, well developed, in no acute distress. HEENT: normal. Neck: Supple, no JVD, carotid bruits, or masses. Cardiac: RRR, no murmurs, rubs, or gallops. No clubbing, cyanosis, edema.  Radials/PT 2+ and equal  bilaterally.  Respiratory:  Respirations regular and unlabored, clear to auscultation bilaterally. GI: Soft, nontender, nondistended. MS: No deformity or atrophy. Skin: Warm and dry, no rash. Neuro:  Strength and sensation are intact. Psych: Normal affect.  Assessment & Plan    CAD status post PCI of the LAD in 2010 -Continue ASA 81 mg, Zetia  10 mg -She would like to try a reduced dose of her pravastatin to '20mg'$  due to side effects -We will plan to repeat lab work in 6-8 weeks  2.  Hypertension -Well controlled today -continue to take BP at home -continue current medications  3.  Hyperlipidemia -Most recent lipid panel was done 10/22 and listed below -Will get updated lipid panel in 2 months   4.  Lower extremity edema -this is chronic and has been stable    LDL Chol Calc (NIH) 59   0-99  CHOL/HDL 2.1   2.0-4.0  Cholesterol 163   <200  HDLD 77   30-85  LDL Chol Calc (NIH) 59   0-99  NHDL 86   0-129  Triglyceride 166      Disposition: Follow up 1 year with Fransico Him, MD or APP.  Signed, Elgie Collard, PA-C 03/30/2022, 12:40 PM [facility]

## 2022-03-30 ENCOUNTER — Encounter: Payer: Self-pay | Admitting: Physician Assistant

## 2022-03-30 ENCOUNTER — Ambulatory Visit: Payer: Medicare Other | Admitting: Physician Assistant

## 2022-03-30 VITALS — BP 140/80 | HR 85 | Ht 61.5 in | Wt 161.0 lb

## 2022-03-30 DIAGNOSIS — I10 Essential (primary) hypertension: Secondary | ICD-10-CM | POA: Diagnosis not present

## 2022-03-30 DIAGNOSIS — R600 Localized edema: Secondary | ICD-10-CM | POA: Diagnosis not present

## 2022-03-30 DIAGNOSIS — I2510 Atherosclerotic heart disease of native coronary artery without angina pectoris: Secondary | ICD-10-CM

## 2022-03-30 DIAGNOSIS — E785 Hyperlipidemia, unspecified: Secondary | ICD-10-CM

## 2022-03-30 MED ORDER — PRAVASTATIN SODIUM 20 MG PO TABS: 20.0000 mg | ORAL_TABLET | Freq: Every evening | ORAL | 3 refills | Status: DC

## 2022-03-30 NOTE — Patient Instructions (Signed)
Medication Instructions:  DECREASE PRAVASTATIN TO 20 MG  *If you need a refill on your cardiac medications before your next appointment, please call your pharmacy*   Lab Work:  If you have labs (blood work) drawn today and your tests are completely normal, you will receive your results only by: [place] (if you have MyChart) OR A paper copy in the mail If you have any lab test that is abnormal or we need to change your treatment, we will call you to review the results.   Testing/Procedures: NONE   Follow-Up: At [facility], you and your health needs are our priority.  As part of our continuing mission to provide you with exceptional heart care, we have created designated Provider Care Teams.  These Care Teams include your primary Cardiologist (physician) and Advanced Practice Providers (APPs -  Physician Assistants and Nurse Practitioners) who all work together to provide you with the care you need, when you need it.  We recommend signing up for the patient portal called "MyChart".  Sign up information is provided on this After Visit Summary.  MyChart is used to connect with patients for Virtual Visits (Telemedicine).  Patients are able to view lab/test results, encounter notes, upcoming appointments, etc.  Non-urgent messages can be sent to your provider as well.   To learn more about what you can do with MyChart, go to [email or web address].    Your next appointment:   1 year(s)  The format for your next appointment:   In Person  Provider:   Fransico Him, MD  or Nicholes Rough, PA-C         Other Instructions NONE  Important Information About Sugar

## 2022-04-03 NOTE — Addendum Note (Signed)
Addended by: Janan Halter F on: 04/03/2022 05:19 PM   Modules accepted: Orders

## 2022-04-06 DIAGNOSIS — L570 Actinic keratosis: Secondary | ICD-10-CM | POA: Diagnosis not present

## 2022-04-06 DIAGNOSIS — C44329 Squamous cell carcinoma of skin of other parts of face: Secondary | ICD-10-CM | POA: Diagnosis not present

## 2022-04-10 NOTE — Addendum Note (Signed)
Addended by: Maren Beach, Honestie Kulik A on: 04/10/2022 08:17 AM   Modules accepted: Orders

## 2022-04-17 DIAGNOSIS — L720 Epidermal cyst: Secondary | ICD-10-CM | POA: Diagnosis not present

## 2022-04-17 DIAGNOSIS — Z85828 Personal history of other malignant neoplasm of skin: Secondary | ICD-10-CM | POA: Diagnosis not present

## 2022-04-19 ENCOUNTER — Other Ambulatory Visit: Payer: Self-pay | Admitting: Cardiology

## 2022-05-23 ENCOUNTER — Other Ambulatory Visit: Payer: Self-pay | Admitting: *Deleted

## 2022-05-23 DIAGNOSIS — E785 Hyperlipidemia, unspecified: Secondary | ICD-10-CM

## 2022-05-31 ENCOUNTER — Other Ambulatory Visit: Payer: Medicare Other

## 2022-06-04 DIAGNOSIS — R233 Spontaneous ecchymoses: Secondary | ICD-10-CM | POA: Diagnosis not present

## 2022-06-05 ENCOUNTER — Ambulatory Visit: Payer: Medicare Other | Attending: Physician Assistant

## 2022-06-05 DIAGNOSIS — E785 Hyperlipidemia, unspecified: Secondary | ICD-10-CM | POA: Diagnosis not present

## 2022-06-05 LAB — LIPID PANEL
Chol/HDL Ratio: 2.2 ratio (ref 0.0–4.4)
Cholesterol, Total: 183 mg/dL (ref 100–199)
HDL: 85 mg/dL (ref >39)
LDL Chol Calc (NIH): 77 mg/dL (ref 0–99)
Triglycerides: 121 mg/dL (ref 0–149)
VLDL Cholesterol Cal: 21 mg/dL (ref 5–40)

## 2022-06-13 DIAGNOSIS — I8311 Varicose veins of right lower extremity with inflammation: Secondary | ICD-10-CM | POA: Diagnosis not present

## 2022-06-13 DIAGNOSIS — I872 Venous insufficiency (chronic) (peripheral): Secondary | ICD-10-CM | POA: Diagnosis not present

## 2022-06-13 DIAGNOSIS — I8312 Varicose veins of left lower extremity with inflammation: Secondary | ICD-10-CM | POA: Diagnosis not present

## 2022-06-13 DIAGNOSIS — L820 Inflamed seborrheic keratosis: Secondary | ICD-10-CM | POA: Diagnosis not present

## 2022-06-13 DIAGNOSIS — L570 Actinic keratosis: Secondary | ICD-10-CM | POA: Diagnosis not present

## 2022-06-14 DIAGNOSIS — M1711 Unilateral primary osteoarthritis, right knee: Secondary | ICD-10-CM | POA: Diagnosis not present

## 2022-07-02 DIAGNOSIS — Z23 Encounter for immunization: Secondary | ICD-10-CM | POA: Diagnosis not present

## 2022-07-05 DIAGNOSIS — M25562 Pain in left knee: Secondary | ICD-10-CM | POA: Diagnosis not present

## 2022-07-05 DIAGNOSIS — Z96652 Presence of left artificial knee joint: Secondary | ICD-10-CM | POA: Diagnosis not present

## 2022-07-10 DIAGNOSIS — G4700 Insomnia, unspecified: Secondary | ICD-10-CM | POA: Diagnosis not present

## 2022-07-10 DIAGNOSIS — C50111 Malignant neoplasm of central portion of right female breast: Secondary | ICD-10-CM | POA: Diagnosis not present

## 2022-07-10 DIAGNOSIS — E7800 Pure hypercholesterolemia, unspecified: Secondary | ICD-10-CM | POA: Diagnosis not present

## 2022-07-10 DIAGNOSIS — M810 Age-related osteoporosis without current pathological fracture: Secondary | ICD-10-CM | POA: Diagnosis not present

## 2022-08-20 DIAGNOSIS — N3090 Cystitis, unspecified without hematuria: Secondary | ICD-10-CM | POA: Diagnosis not present

## 2022-08-20 DIAGNOSIS — R300 Dysuria: Secondary | ICD-10-CM | POA: Diagnosis not present

## 2022-09-12 DIAGNOSIS — R350 Frequency of micturition: Secondary | ICD-10-CM | POA: Diagnosis not present

## 2022-09-17 DIAGNOSIS — Z961 Presence of intraocular lens: Secondary | ICD-10-CM | POA: Diagnosis not present

## 2022-09-17 DIAGNOSIS — H04123 Dry eye syndrome of bilateral lacrimal glands: Secondary | ICD-10-CM | POA: Diagnosis not present

## 2022-09-17 DIAGNOSIS — H02831 Dermatochalasis of right upper eyelid: Secondary | ICD-10-CM | POA: Diagnosis not present

## 2022-09-17 DIAGNOSIS — H10413 Chronic giant papillary conjunctivitis, bilateral: Secondary | ICD-10-CM | POA: Diagnosis not present

## 2022-09-17 DIAGNOSIS — H02834 Dermatochalasis of left upper eyelid: Secondary | ICD-10-CM | POA: Diagnosis not present

## 2022-09-17 DIAGNOSIS — H43811 Vitreous degeneration, right eye: Secondary | ICD-10-CM | POA: Diagnosis not present

## 2022-09-17 DIAGNOSIS — H26493 Other secondary cataract, bilateral: Secondary | ICD-10-CM | POA: Diagnosis not present

## 2022-09-24 DIAGNOSIS — M1711 Unilateral primary osteoarthritis, right knee: Secondary | ICD-10-CM | POA: Diagnosis not present

## 2022-09-25 DIAGNOSIS — R2689 Other abnormalities of gait and mobility: Secondary | ICD-10-CM | POA: Diagnosis not present

## 2022-09-28 DIAGNOSIS — R2689 Other abnormalities of gait and mobility: Secondary | ICD-10-CM | POA: Diagnosis not present

## 2022-10-03 DIAGNOSIS — R2689 Other abnormalities of gait and mobility: Secondary | ICD-10-CM | POA: Diagnosis not present

## 2022-10-04 DIAGNOSIS — R2689 Other abnormalities of gait and mobility: Secondary | ICD-10-CM | POA: Diagnosis not present

## 2022-10-09 DIAGNOSIS — R2689 Other abnormalities of gait and mobility: Secondary | ICD-10-CM | POA: Diagnosis not present

## 2022-10-11 DIAGNOSIS — R2689 Other abnormalities of gait and mobility: Secondary | ICD-10-CM | POA: Diagnosis not present

## 2022-10-15 DIAGNOSIS — R051 Acute cough: Secondary | ICD-10-CM | POA: Diagnosis not present

## 2022-10-15 DIAGNOSIS — J0140 Acute pansinusitis, unspecified: Secondary | ICD-10-CM | POA: Diagnosis not present

## 2022-10-23 DIAGNOSIS — R2689 Other abnormalities of gait and mobility: Secondary | ICD-10-CM | POA: Diagnosis not present

## 2022-10-25 DIAGNOSIS — R2689 Other abnormalities of gait and mobility: Secondary | ICD-10-CM | POA: Diagnosis not present

## 2022-10-30 DIAGNOSIS — R2689 Other abnormalities of gait and mobility: Secondary | ICD-10-CM | POA: Diagnosis not present

## 2022-11-01 DIAGNOSIS — R2689 Other abnormalities of gait and mobility: Secondary | ICD-10-CM | POA: Diagnosis not present

## 2022-11-05 DIAGNOSIS — Z8551 Personal history of malignant neoplasm of bladder: Secondary | ICD-10-CM | POA: Diagnosis not present

## 2022-11-06 DIAGNOSIS — R2689 Other abnormalities of gait and mobility: Secondary | ICD-10-CM | POA: Diagnosis not present

## 2022-11-14 DIAGNOSIS — R2689 Other abnormalities of gait and mobility: Secondary | ICD-10-CM | POA: Diagnosis not present

## 2022-11-16 DIAGNOSIS — R2689 Other abnormalities of gait and mobility: Secondary | ICD-10-CM | POA: Diagnosis not present

## 2022-11-20 DIAGNOSIS — L578 Other skin changes due to chronic exposure to nonionizing radiation: Secondary | ICD-10-CM | POA: Diagnosis not present

## 2022-11-20 DIAGNOSIS — Z85828 Personal history of other malignant neoplasm of skin: Secondary | ICD-10-CM | POA: Diagnosis not present

## 2022-11-20 DIAGNOSIS — L570 Actinic keratosis: Secondary | ICD-10-CM | POA: Diagnosis not present

## 2022-11-20 DIAGNOSIS — D1801 Hemangioma of skin and subcutaneous tissue: Secondary | ICD-10-CM | POA: Diagnosis not present

## 2022-11-20 DIAGNOSIS — L720 Epidermal cyst: Secondary | ICD-10-CM | POA: Diagnosis not present

## 2022-11-20 DIAGNOSIS — C44629 Squamous cell carcinoma of skin of left upper limb, including shoulder: Secondary | ICD-10-CM | POA: Diagnosis not present

## 2022-11-21 DIAGNOSIS — R2689 Other abnormalities of gait and mobility: Secondary | ICD-10-CM | POA: Diagnosis not present

## 2022-11-23 DIAGNOSIS — R2689 Other abnormalities of gait and mobility: Secondary | ICD-10-CM | POA: Diagnosis not present

## 2022-11-27 DIAGNOSIS — R2689 Other abnormalities of gait and mobility: Secondary | ICD-10-CM | POA: Diagnosis not present

## 2022-11-30 DIAGNOSIS — R2689 Other abnormalities of gait and mobility: Secondary | ICD-10-CM | POA: Diagnosis not present

## 2022-12-04 DIAGNOSIS — R2689 Other abnormalities of gait and mobility: Secondary | ICD-10-CM | POA: Diagnosis not present

## 2022-12-06 DIAGNOSIS — R2689 Other abnormalities of gait and mobility: Secondary | ICD-10-CM | POA: Diagnosis not present

## 2022-12-11 DIAGNOSIS — R2689 Other abnormalities of gait and mobility: Secondary | ICD-10-CM | POA: Diagnosis not present

## 2022-12-13 DIAGNOSIS — R2689 Other abnormalities of gait and mobility: Secondary | ICD-10-CM | POA: Diagnosis not present

## 2022-12-20 DIAGNOSIS — R2689 Other abnormalities of gait and mobility: Secondary | ICD-10-CM | POA: Diagnosis not present

## 2022-12-25 DIAGNOSIS — R2689 Other abnormalities of gait and mobility: Secondary | ICD-10-CM | POA: Diagnosis not present

## 2023-01-04 DIAGNOSIS — R2689 Other abnormalities of gait and mobility: Secondary | ICD-10-CM | POA: Diagnosis not present

## 2023-01-08 DIAGNOSIS — R2689 Other abnormalities of gait and mobility: Secondary | ICD-10-CM | POA: Diagnosis not present

## 2023-01-11 DIAGNOSIS — R2689 Other abnormalities of gait and mobility: Secondary | ICD-10-CM | POA: Diagnosis not present

## 2023-01-14 DIAGNOSIS — M1711 Unilateral primary osteoarthritis, right knee: Secondary | ICD-10-CM | POA: Diagnosis not present

## 2023-01-15 DIAGNOSIS — R2689 Other abnormalities of gait and mobility: Secondary | ICD-10-CM | POA: Diagnosis not present

## 2023-01-23 DIAGNOSIS — R2689 Other abnormalities of gait and mobility: Secondary | ICD-10-CM | POA: Diagnosis not present

## 2023-01-25 DIAGNOSIS — R2689 Other abnormalities of gait and mobility: Secondary | ICD-10-CM | POA: Diagnosis not present

## 2023-01-31 DIAGNOSIS — R2689 Other abnormalities of gait and mobility: Secondary | ICD-10-CM | POA: Diagnosis not present

## 2023-02-05 DIAGNOSIS — R2689 Other abnormalities of gait and mobility: Secondary | ICD-10-CM | POA: Diagnosis not present

## 2023-02-07 DIAGNOSIS — R2689 Other abnormalities of gait and mobility: Secondary | ICD-10-CM | POA: Diagnosis not present

## 2023-02-11 ENCOUNTER — Telehealth: Payer: Self-pay | Admitting: Physician Assistant

## 2023-02-11 DIAGNOSIS — Z79899 Other long term (current) drug therapy: Secondary | ICD-10-CM

## 2023-02-11 DIAGNOSIS — E785 Hyperlipidemia, unspecified: Secondary | ICD-10-CM

## 2023-02-11 NOTE — Telephone Encounter (Signed)
Left message for patient informing her lab order for Lipid panel has been updated and she is all set to have labs drawn as scheduled on 04/09/23.

## 2023-02-11 NOTE — Telephone Encounter (Signed)
Lipid panel ordered and linked to lab appt for 04/09/23.

## 2023-02-11 NOTE — Telephone Encounter (Signed)
Patient called in scheduling her yearly f/u and scheduled labs a few days prior the same week on 07/30. Order in system for the lipid panel is expired so I was unable to link request to the appt.  Please advise.

## 2023-03-07 DIAGNOSIS — Z1231 Encounter for screening mammogram for malignant neoplasm of breast: Secondary | ICD-10-CM | POA: Diagnosis not present

## 2023-03-21 DIAGNOSIS — M1711 Unilateral primary osteoarthritis, right knee: Secondary | ICD-10-CM | POA: Diagnosis not present

## 2023-03-28 DIAGNOSIS — M1711 Unilateral primary osteoarthritis, right knee: Secondary | ICD-10-CM | POA: Diagnosis not present

## 2023-04-02 ENCOUNTER — Other Ambulatory Visit: Payer: Self-pay | Admitting: Cardiology

## 2023-04-04 DIAGNOSIS — M25562 Pain in left knee: Secondary | ICD-10-CM | POA: Diagnosis not present

## 2023-04-04 DIAGNOSIS — M1711 Unilateral primary osteoarthritis, right knee: Secondary | ICD-10-CM | POA: Diagnosis not present

## 2023-04-09 ENCOUNTER — Ambulatory Visit: Payer: Medicare Other | Attending: Physician Assistant

## 2023-04-09 DIAGNOSIS — E785 Hyperlipidemia, unspecified: Secondary | ICD-10-CM

## 2023-04-09 DIAGNOSIS — Z79899 Other long term (current) drug therapy: Secondary | ICD-10-CM | POA: Diagnosis not present

## 2023-04-09 LAB — LIPID PANEL
Chol/HDL Ratio: 2 ratio (ref 0.0–4.4)
Cholesterol, Total: 192 mg/dL (ref 100–199)
HDL: 95 mg/dL (ref >39)
LDL Chol Calc (NIH): 73 mg/dL (ref 0–99)
Triglycerides: 145 mg/dL (ref 0–149)
VLDL Cholesterol Cal: 24 mg/dL (ref 5–40)

## 2023-04-11 NOTE — Progress Notes (Signed)
Office Visit    Patient Name: [name] Date of Encounter: 04/12/2023  PCP:  Renford Dills, MD   [facility]  Cardiologist:  Armanda Magic, MD  Advanced Practice Provider:  No care team member to display Electrophysiologist:  None   HPI    [name] is a 88 y.o. female with a hx of CAD status post PCI of the LAD in 2010, hypertension, and hyperlipidemia presents today for routine follow-up visit.  She was last seen 02/2021 by Dr. Mayford Knife.  She had some mild DOE that she thought was stable at the time.  She does have a relatively sedentary state with lack of exercise.  She does do some occasional chair yoga.  She denied any chest pain or pressure at that time.  She had chronic LE edema which was stable.  She was compliant with her medications.  She saw me 7//21/23 and she denies any chest pain or shortness of breath.  She states that she has been less active due to taking care of her husband and his recent stroke.  She does have chronic lower extremity edema.  She would like to try a lower dose of her pravastatin since she does have a lot of joint problems.  Otherwise, she is doing well today.  Today, she tells me that her husband got COVID and is now in long-term care.  This is a big change for them since they have lived together for 67 years.  She has some family in [place] in [place].  They come into visit often.  His pain level is severe and now he is on palliative care which can help better control his pain level.  We went over her recent lab work and her LDL and total cholesterol are at goal.  She has been tolerating her statin and Zetia but was thinking she could get off 1 of these medicines potentially.  We did discuss a Zetia holiday to see if it would make a difference with her cholesterol but she is decided to hold off for now.  She also is having some lower extremity swelling and we have ordered Lasix 20 mg x 3 days and then as needed to see if this  helps.  Recommended elevating her feet as well.  She cannot wear compressive hose or stockings because her skin is very thin and it is very painful.  Her knee pain is bone-on-bone and she has been icing every day.  Also has gotten gel injections.  Was asking about taking ibuprofen as needed.  We discussed skipping her aspirin on these days and using it sparingly.  She is also on Tylenol daily for arthritis.  Reports no shortness of breath nor dyspnea on exertion. Reports no chest pain, pressure, or tightness. No edema, orthopnea, PND. Reports no palpitations.    Past Medical History    Past Medical History:  Diagnosis Date   Anxiety    Arthritis    Bladder cancer ([facility]) 2012   Breast cancer ([facility])    CAD (coronary artery disease) 2/10   S/P PCI (Stent) of LAD. Normal LVF-No angina   Cancer of central portion of female breast, right 11/22/2015   Cataract    left eye   Colonic stricture ([facility]) 2013   and colon adenoma   Constipation    predominant IBS-miralax and citrucel daily-has used linzess in the past. Dr Dion Saucier)   Diverticulosis of colon (without mention of hemorrhage)    External hemorrhoids  without mention of complication    Hyperlipidemia    IBS (irritable bowel syndrome)    Insomnia    MSSA (methicillin susceptible Staphylococcus aureus) 2011   buttock abscess   Osteopenia    mild   Phlebitis    PONV (postoperative nausea and vomiting)    Shingles    Squamous cell carcinoma, leg 2012   right, left   Thyroid disease    hypothyroidism   Vitamin D insufficiency    Dr. Tresa Res   Past Surgical History:  Procedure Laterality Date   Bladder cancer surgery  09/10/2010   Dr Dalhstedt   CORONARY ANGIOPLASTY WITH STENT PLACEMENT  10/11/2008   HEMORRHOID SURGERY     KNEE ARTHROSCOPY Left    Age 75-30   Left elbow surgery x4     RADIOACTIVE SEED GUIDED PARTIAL MASTECTOMY WITH AXILLARY SENTINEL LYMPH NODE BIOPSY Right 01/04/2016   Procedure: RADIOACTIVE SEED GUIDED PARTIAL  MASTECTOMY WITH AXILLARY SENTINEL LYMPH NODE BIOPSY;  Surgeon: Chevis Pretty III, MD;  Location: [facility];  Service: General;  Laterality: Right;   TONSILLECTOMY     TOTAL KNEE ARTHROPLASTY  06/10/2000   Left    Allergies  Allergies  Allergen Reactions   Benadryl [Diphenhydramine Hcl] Other (See Comments)    hyperactivity   Crestor [Rosuvastatin] Other (See Comments)    Muscle Aches Other reaction(s): muscle aches   Lipitor [Atorvastatin] Other (See Comments)    Muscle aches   Morphine And Codeine Nausea And Vomiting   Tramadol Nausea And Vomiting and Other (See Comments)   Keflex [Cephalexin]     Other reaction(s): n/v abd pain   Adhesive [Tape] Other (See Comments)    Thin and sensitive skin, please use "paper" tape   Cortisone Rash   Sulfonamide Derivatives Hives     EKGs/Labs/Other Studies Reviewed:   The following studies were reviewed today:  Labs listed below.  EKG:  EKG is  ordered today.  The ekg ordered today demonstrates NSR, rate 85 bpm  Recent Labs: No results found for requested labs within last 365 days.  Recent Lipid Panel    Component Value Date/Time   CHOL 192 04/09/2023 0911   CHOL 164 06/21/2014 0829   TRIG 145 04/09/2023 0911   TRIG 115 06/21/2014 0829   HDL 95 04/09/2023 0911   HDL 78 06/21/2014 0829   CHOLHDL 2.0 04/09/2023 0911   CHOLHDL 1.8 01/25/2016 0826   VLDL 17 01/25/2016 0826   LDLCALC 73 04/09/2023 0911   LDLCALC 63 06/21/2014 0829     Home Medications   Current Meds  Medication Sig   acetaminophen (TYLENOL) 500 MG tablet Take 500 mg by mouth.   ALPRAZolam (XANAX) 0.5 MG tablet Take 0.25 mg by mouth daily as needed for sleep. Reported on 02/22/2016   ascorbic acid (VITAMIN C) 500 MG tablet Take 1 tablet by mouth daily.   aspirin EC 81 MG tablet Take 81 mg by mouth daily.   B Complex Vitamins (B COMPLEX PO) Take by mouth.   furosemide (LASIX) 20 MG tablet Take one tablet by mouth daily for 3 days, then take daily as needed for leg  swelling.   hydrocortisone 2.5 % cream Apply topically.   levothyroxine (SYNTHROID, LEVOTHROID) 50 MCG tablet Take 50 mcg by mouth daily.   Magnesium Hydroxide (MAGNESIA PO) Take 1 tablet by mouth daily. Pt takes 400 mg   mupirocin ointment (BACTROBAN) 2 % Apply topically daily.   polyethylene glycol (MIRALAX / GLYCOLAX) 17 g packet Take 17  g by mouth daily.   predniSONE (DELTASONE) 5 MG tablet Take 5 mg by mouth 2 (two) times daily with a meal. Pt is instructed to take for only 5 days.   psyllium (HYDROCIL/METAMUCIL) 95 % PACK Take 1 packet by mouth daily.   VITAMIN D PO Take by mouth.   zinc sulfate 220 (50 Zn) MG capsule Take 1 capsule by mouth as needed.   [DISCONTINUED] ezetimibe (ZETIA) 10 MG tablet TAKE ONE (1) TABLET BY MOUTH EACH DAY   [DISCONTINUED] pravastatin (PRAVACHOL) 20 MG tablet Take 1 tablet (20 mg total) by mouth every evening. Please keep scheduled appointment for future refills. Thank you.     Review of Systems      All other systems reviewed and are otherwise negative except as noted above.  Physical Exam    VS:  BP 138/82   Pulse 67   Ht 5' 1.5" (1.562 m)   Wt 160 lb (72.6 kg)   SpO2 98%   BMI 29.74 kg/m  , BMI Body mass index is 29.74 kg/m.  Wt Readings from Last 3 Encounters:  04/12/23 160 lb (72.6 kg)  03/30/22 161 lb (73 kg)  08/23/21 168 lb 6 oz (76.4 kg)     GEN: Well nourished, well developed, in no acute distress. HEENT: normal. Neck: Supple, no JVD, carotid bruits, or masses. Cardiac: RRR, no murmurs, rubs, or gallops. No clubbing, cyanosis, 1-2 + LE bilateral edema.  Radials/PT 2+ and equal bilaterally.  Respiratory:  Respirations regular and unlabored, clear to auscultation bilaterally. GI: Soft, nontender, nondistended. MS: No deformity or atrophy. Skin: Warm and dry, no rash. Neuro:  Strength and sensation are intact. Psych: Normal affect.  Assessment & Plan    CAD status post PCI of the LAD in 2010 -Continue ASA 81 mg, Zetia 10  mg -Continue pravastatin at current dose -Reviewed lab work with her and LDL is 73, at goal, triglycerides 145 and total cholesterol 192 -Would continue current medication regimen  2.  Hypertension -Well controlled today -continue to take BP at home -continue current medications  3.  Hyperlipidemia -lipid panel reviewed, numbers are at goal -pravastatin and zetia, continue   4.  Lower extremity edema -this is chronic and has been stable -we did add lasix 20mg  x 3 days, lasix PRN for LE edema     Disposition: Follow up 1 year with Armanda Magic, MD or APP.  Signed, Sharlene Dory, PA-C 04/12/2023, 11:26 AM [facility]

## 2023-04-12 ENCOUNTER — Encounter: Payer: Self-pay | Admitting: Physician Assistant

## 2023-04-12 ENCOUNTER — Ambulatory Visit: Payer: Medicare Other | Attending: Physician Assistant | Admitting: Physician Assistant

## 2023-04-12 VITALS — BP 138/82 | HR 67 | Ht 61.5 in | Wt 160.0 lb

## 2023-04-12 DIAGNOSIS — R600 Localized edema: Secondary | ICD-10-CM | POA: Diagnosis not present

## 2023-04-12 DIAGNOSIS — I2510 Atherosclerotic heart disease of native coronary artery without angina pectoris: Secondary | ICD-10-CM

## 2023-04-12 DIAGNOSIS — E785 Hyperlipidemia, unspecified: Secondary | ICD-10-CM | POA: Diagnosis not present

## 2023-04-12 DIAGNOSIS — I10 Essential (primary) hypertension: Secondary | ICD-10-CM | POA: Diagnosis not present

## 2023-04-12 MED ORDER — EZETIMIBE 10 MG PO TABS: 10.0000 mg | ORAL_TABLET | Freq: Every day | ORAL | 3 refills | Status: DC

## 2023-04-12 MED ORDER — PRAVASTATIN SODIUM 20 MG PO TABS: 20.0000 mg | ORAL_TABLET | Freq: Every day | ORAL | 3 refills | Status: DC

## 2023-04-12 MED ORDER — FUROSEMIDE 20 MG PO TABS: ORAL_TABLET | ORAL | 3 refills | Status: DC

## 2023-04-12 NOTE — Patient Instructions (Addendum)
Medication Instructions:  Your physician has recommended you make the following change in your medication:  1-Take Lasix 20 mg by mouth daily for 3 days and as needed for leg swelling.   *If you need a refill on your cardiac medications before your next appointment, please call your pharmacy*  Lab Work: If you have labs (blood work) drawn today and your tests are completely normal, you will receive your results only by: MyChart Message (if you have MyChart) OR A paper copy in the mail If you have any lab test that is abnormal or we need to change your treatment, we will call you to review the results.  Testing/Procedures: None ordered today.  Follow-Up: At [facility], you and your health needs are our priority.  As part of our continuing mission to provide you with exceptional heart care, we have created designated Provider Care Teams.  These Care Teams include your primary Cardiologist (physician) and Advanced Practice Providers (APPs -  Physician Assistants and Nurse Practitioners) who all work together to provide you with the care you need, when you need it.  We recommend signing up for the patient portal called "MyChart".  Sign up information is provided on this After Visit Summary.  MyChart is used to connect with patients for Virtual Visits (Telemedicine).  Patients are able to view lab/test results, encounter notes, upcoming appointments, etc.  Non-urgent messages can be sent to your provider as well.   To learn more about what you can do with MyChart, go to [email or web address].    Your next appointment:   1 year(s)  Provider:   Armanda Magic, MD   or Jari Favre PA

## 2023-05-15 DIAGNOSIS — M25561 Pain in right knee: Secondary | ICD-10-CM | POA: Diagnosis not present

## 2023-06-10 ENCOUNTER — Other Ambulatory Visit: Payer: Self-pay | Admitting: Cardiology

## 2023-06-12 DIAGNOSIS — S8011XA Contusion of right lower leg, initial encounter: Secondary | ICD-10-CM | POA: Diagnosis not present

## 2023-06-27 ENCOUNTER — Emergency Department (HOSPITAL_BASED_OUTPATIENT_CLINIC_OR_DEPARTMENT_OTHER): Payer: Medicare Other

## 2023-06-27 ENCOUNTER — Telehealth (HOSPITAL_BASED_OUTPATIENT_CLINIC_OR_DEPARTMENT_OTHER): Payer: Self-pay

## 2023-06-27 ENCOUNTER — Other Ambulatory Visit: Payer: Self-pay

## 2023-06-27 ENCOUNTER — Emergency Department (HOSPITAL_BASED_OUTPATIENT_CLINIC_OR_DEPARTMENT_OTHER)
Admission: EM | Admit: 2023-06-27 | Discharge: 2023-06-27 | Disposition: A | Discharge status: Home / Self Care | Payer: Medicare Other

## 2023-06-27 ENCOUNTER — Encounter (HOSPITAL_BASED_OUTPATIENT_CLINIC_OR_DEPARTMENT_OTHER): Payer: Self-pay | Admitting: Urology

## 2023-06-27 DIAGNOSIS — K429 Umbilical hernia without obstruction or gangrene: Secondary | ICD-10-CM | POA: Insufficient documentation

## 2023-06-27 DIAGNOSIS — D259 Leiomyoma of uterus, unspecified: Secondary | ICD-10-CM | POA: Diagnosis not present

## 2023-06-27 DIAGNOSIS — R29890 Loss of height: Secondary | ICD-10-CM | POA: Diagnosis not present

## 2023-06-27 DIAGNOSIS — M4726 Other spondylosis with radiculopathy, lumbar region: Secondary | ICD-10-CM | POA: Diagnosis not present

## 2023-06-27 DIAGNOSIS — Z79890 Hormone replacement therapy (postmenopausal): Secondary | ICD-10-CM | POA: Insufficient documentation

## 2023-06-27 DIAGNOSIS — E039 Hypothyroidism, unspecified: Secondary | ICD-10-CM | POA: Diagnosis not present

## 2023-06-27 DIAGNOSIS — R109 Unspecified abdominal pain: Secondary | ICD-10-CM | POA: Diagnosis not present

## 2023-06-27 DIAGNOSIS — K5730 Diverticulosis of large intestine without perforation or abscess without bleeding: Secondary | ICD-10-CM | POA: Insufficient documentation

## 2023-06-27 DIAGNOSIS — S20229A Contusion of unspecified back wall of thorax, initial encounter: Secondary | ICD-10-CM | POA: Diagnosis not present

## 2023-06-27 DIAGNOSIS — W228XXA Striking against or struck by other objects, initial encounter: Secondary | ICD-10-CM | POA: Diagnosis not present

## 2023-06-27 DIAGNOSIS — M4856XA Collapsed vertebra, not elsewhere classified, lumbar region, initial encounter for fracture: Secondary | ICD-10-CM | POA: Diagnosis not present

## 2023-06-27 DIAGNOSIS — I700 Atherosclerosis of aorta: Secondary | ICD-10-CM | POA: Insufficient documentation

## 2023-06-27 DIAGNOSIS — Z7982 Long term (current) use of aspirin: Secondary | ICD-10-CM | POA: Insufficient documentation

## 2023-06-27 DIAGNOSIS — S3992XA Unspecified injury of lower back, initial encounter: Secondary | ICD-10-CM | POA: Diagnosis not present

## 2023-06-27 DIAGNOSIS — S300XXA Contusion of lower back and pelvis, initial encounter: Secondary | ICD-10-CM | POA: Diagnosis not present

## 2023-06-27 DIAGNOSIS — M545 Low back pain, unspecified: Secondary | ICD-10-CM | POA: Insufficient documentation

## 2023-06-27 MED ORDER — ONDANSETRON HCL 4 MG PO TABS: 4.0000 mg | ORAL_TABLET | Freq: Four times a day (QID) | ORAL | 0 refills | Status: DC

## 2023-06-27 MED ORDER — HYDROCODONE-ACETAMINOPHEN 5-325 MG PO TABS
1.0000 | ORAL_TABLET | Freq: Once | ORAL | Status: AC
  Administered 2023-06-27: 1 via ORAL
  Filled 2023-06-27: qty 1

## 2023-06-27 MED ORDER — ONDANSETRON 4 MG PO TBDP
ORAL_TABLET | ORAL | Status: AC
  Administered 2023-06-27: 4 mg via ORAL
  Filled 2023-06-27: qty 1

## 2023-06-27 MED ORDER — OXYCODONE-ACETAMINOPHEN 5-325 MG PO TABS
1.0000 | ORAL_TABLET | Freq: Four times a day (QID) | ORAL | 0 refills | Status: DC | PRN
Start: 2023-06-27 — End: 2024-04-27

## 2023-06-27 MED ORDER — CALCITONIN (SALMON) 200 UNIT/ACT NA SOLN: 1.0000 | Freq: Every day | NASAL | 12 refills | Status: DC

## 2023-06-27 MED ORDER — DICLOFENAC SODIUM 1 % EX GEL: 2.0000 g | Freq: Four times a day (QID) | CUTANEOUS | 0 refills | Status: DC

## 2023-06-27 MED ORDER — LIDOCAINE 5 % EX PTCH
1.0000 | MEDICATED_PATCH | CUTANEOUS | Status: DC
  Administered 2023-06-27: 1 via TRANSDERMAL
  Filled 2023-06-27: qty 1

## 2023-06-27 MED ORDER — LIDOCAINE 5 % EX PTCH: 1.0000 | MEDICATED_PATCH | CUTANEOUS | 0 refills | Status: DC

## 2023-06-27 MED ORDER — ONDANSETRON 4 MG PO TBDP: 4.0000 mg | ORAL_TABLET | Freq: Once | ORAL | Status: AC

## 2023-06-27 MED ORDER — ONDANSETRON HCL 4 MG/2ML IJ SOLN
4.0000 mg | Freq: Once | INTRAMUSCULAR | Status: DC
  Filled 2023-06-27: qty 2

## 2023-06-27 NOTE — Discharge Instructions (Addendum)
We did not get the results of your scan so I cannot say for sure if there are any fractures or significant injuries or not.  Continue taking 1000 mg of Tylenol every 8 hours.  Use the Voltaren gel as well as the lidocaine patches on your back.  Follow-up with your doctor.  Return to the ER for worsening symptoms.

## 2023-06-27 NOTE — ED Provider Notes (Signed)
[facility] Provider Note   CSN: [id number] Arrival date & time: 06/27/23  1154     History  Chief Complaint  Patient presents with   [name] is a 88 y.o. female.  88 year old for female with past medical history of hypothyroidism and anxiety presenting to the emergency department today with lower back pain.  Patient states this began after she tripped and fell against her wall on Monday.  She states that she struck her back against wall.  States she been having pain in her lower back since then.  She also reports some bruising over her buttocks.  She denies any bowel or bladder dysfunction and has not had any saddle anesthesia.  She normally ambulates with a cane but has been using a walker since then.  She did not hit her head or lose consciousness.  Is not on any blood thinners.  In the ER today for the evaluation due to ongoing symptoms.  She has been taking Tylenol for pain.   Fall       Home Medications Prior to Admission medications   Medication Sig Start Date End Date Taking? Authorizing Provider  diclofenac Sodium (VOLTAREN) 1 % GEL Apply 2 g topically 4 (four) times daily. 06/27/23  Yes Durwin Glaze, MD  lidocaine (LIDODERM) 5 % Place 1 patch onto the skin daily. Remove & Discard patch within 12 hours or as directed by MD 06/27/23  Yes Durwin Glaze, MD  acetaminophen (TYLENOL) 500 MG tablet Take 500 mg by mouth.    [provider]  ALPRAZolam Prudy Feeler) 0.5 MG tablet Take 0.25 mg by mouth daily as needed for sleep. Reported on 02/22/2016 03/31/15   [provider]  ascorbic acid (VITAMIN C) 500 MG tablet Take 1 tablet by mouth daily. 06/20/20   [provider]  aspirin EC 81 MG tablet Take 81 mg by mouth daily.    [provider]  B Complex Vitamins (B COMPLEX PO) Take by mouth.    [provider]  ezetimibe (ZETIA) 10 MG tablet TAKE ONE (1) TABLET BY MOUTH EACH DAY  06/12/23   Quintella Reichert, MD  furosemide (LASIX) 20 MG tablet Take one tablet by mouth daily for 3 days, then take daily as needed for leg swelling. 04/12/23   Sharlene Dory, PA-C  hydrocortisone 2.5 % cream Apply topically. 10/05/20   [provider]  levothyroxine (SYNTHROID, LEVOTHROID) 50 MCG tablet Take 50 mcg by mouth daily.    [provider]  Magnesium Hydroxide (MAGNESIA PO) Take 1 tablet by mouth daily. Pt takes 400 mg    [provider]  mupirocin ointment (BACTROBAN) 2 % Apply topically daily. 10/21/20   [provider]  polyethylene glycol (MIRALAX / GLYCOLAX) 17 g packet Take 17 g by mouth daily.    [provider]  pravastatin (PRAVACHOL) 20 MG tablet Take 1 tablet (20 mg total) by mouth daily. 04/12/23   Sharlene Dory, PA-C  predniSONE (DELTASONE) 5 MG tablet Take 5 mg by mouth 2 (two) times daily with a meal. Pt is instructed to take for only 5 days. 04/04/23   [provider]  psyllium (HYDROCIL/METAMUCIL) 95 % PACK Take 1 packet by mouth daily.    [provider]  VITAMIN D PO Take by mouth.    [provider]  zinc sulfate 220 (50 Zn) MG capsule Take 1 capsule by mouth as needed. 06/20/20  [provider]      Allergies    Benadryl [diphenhydramine hcl], Crestor [rosuvastatin], Lipitor [atorvastatin], Morphine and codeine, Tramadol, Keflex [cephalexin], Adhesive [tape], Cortisone, and Sulfonamide derivatives    Review of Systems   Review of Systems  Musculoskeletal:  Positive for back pain.  All other systems reviewed and are negative.   Physical Exam Updated Vital Signs BP (!) 160/82 (BP Location: Left Arm)   Pulse 91   Temp 98 F (36.7 C)   Resp 18   Ht 5\' 2"  (1.575 m)   Wt 72.6 kg   SpO2 97%   BMI 29.27 kg/m  Physical Exam Vitals and nursing note reviewed.   Gen: NAD Eyes: PERRL, EOMI HEENT: no oropharyngeal swelling Neck: trachea midline Resp: clear to auscultation  bilaterally Card: RRR, no murmurs, rubs, or gallops Abd: nontender, nondistended Extremities: no calf tenderness, no edema Vascular: 2+ radial pulses bilaterally, 2+ DP pulses bilaterally MSK: The patient is tender over the mid lumbar spine as well as over the ASIS on the left.  There is some bruising noted over the left buttock Neuro: Equal strength sensation throughout bilateral lower extremities Skin: no rashes Psyc: acting appropriately   ED Results / Procedures / Treatments   Labs (all labs ordered are listed, but only abnormal results are displayed) Labs Reviewed - No data to display  EKG None  Radiology No results found.  Procedures Procedures    Medications Ordered in ED Medications  lidocaine (LIDODERM) 5 % 1 patch (1 patch Transdermal Patch Applied 06/27/23 1330)  HYDROcodone-acetaminophen (NORCO/VICODIN) 5-325 MG per tablet 1 tablet (1 tablet Oral Given 06/27/23 1246)  ondansetron (ZOFRAN-ODT) disintegrating tablet 4 mg (4 mg Oral Given 06/27/23 1247)    ED Course/ Medical Decision Making/ A&P                                 Medical Decision Making 88 year old female with past medical history of hypothyroidism and gait instability at baseline presenting to the emergency department today with low back pain and pain over her ASIS joints.  Obtain a CT scan of her lumbar spine and pelvis for further evaluation for acute traumatic injuries.  I will give her Norco for pain here as well as lidocaine patch and reevaluate for ultimate disposition.  She does not have any red flag symptoms for cauda equina syndrome or cord compressing lesion at this time.  The patient had her CT scans performed.  After a few hours the scans had not been read.  Patient did not want to wait any longer for these reads.  She reports that her husband is at home on hospice care and they got a call that he was doing a little worse and she would like to be discharged.  She does have decision-making  capacity and ultimately discharged through shared decision making.  Her CT scans were not resulted at the time of discharge and she does understand that there is possibility of serious injury which could lead to debility.  I followed up on the patient's imaging studies.  It does appear that she has a compression fracture.  I called all of the numbers in her chart including her emergency contact and was unable to talk to her directly.  I did leave a voicemail and provided her the number for Dr. Jake Samples who is on call for neurosurgery.  I also instructed her to return to the ER for weakness  or worsening symptoms in the voicemail.  Amount and/or Complexity of Data Reviewed Radiology: ordered.  Risk Prescription drug management.          Final Clinical Impression(s) / ED Diagnoses Final diagnoses:  Contusion of back, unspecified laterality, initial encounter    Rx / DC Orders ED Discharge Orders          Ordered    diclofenac Sodium (VOLTAREN) 1 % GEL  4 times daily        06/27/23 1434    lidocaine (LIDODERM) 5 %  Every 24 hours        06/27/23 1434              Durwin Glaze, MD 06/27/23 [phone]

## 2023-06-27 NOTE — ED Triage Notes (Signed)
Pt states fall Monday, hit lower back on wall  And hit head but no LOC, no injury to head  Bruise to buttocks per pt  Took tylenol pta   NO blood thinners

## 2023-06-27 NOTE — ED Notes (Signed)
ED Provider at bedside. Pt husband is dying and pt request to leave.

## 2023-06-27 NOTE — Telephone Encounter (Addendum)
I did get in touch with the patient and went over her test results.  I sent a prescription for percocet to her pharmacy as well as zofran and calcitonin.  I instructed her to call neurosurgery tomorrow for a follow up and to return to the ER for worsening symptoms.

## 2023-06-27 NOTE — ED Notes (Signed)
Fall risk sign on door Fall risk armband Fall risk socks

## 2023-07-01 NOTE — Plan of Care (Signed)
CHL Tonsillectomy/Adenoidectomy, Postoperative PEDS care plan entered in error.

## 2023-07-12 ENCOUNTER — Other Ambulatory Visit: Payer: Self-pay | Admitting: Internal Medicine

## 2023-07-12 DIAGNOSIS — I25119 Atherosclerotic heart disease of native coronary artery with unspecified angina pectoris: Secondary | ICD-10-CM | POA: Diagnosis not present

## 2023-07-12 DIAGNOSIS — S81819A Laceration without foreign body, unspecified lower leg, initial encounter: Secondary | ICD-10-CM | POA: Diagnosis not present

## 2023-07-12 DIAGNOSIS — S32000A Wedge compression fracture of unspecified lumbar vertebra, initial encounter for closed fracture: Secondary | ICD-10-CM | POA: Diagnosis not present

## 2023-07-12 DIAGNOSIS — E039 Hypothyroidism, unspecified: Secondary | ICD-10-CM | POA: Diagnosis not present

## 2023-07-12 DIAGNOSIS — Z79899 Other long term (current) drug therapy: Secondary | ICD-10-CM | POA: Diagnosis not present

## 2023-07-12 DIAGNOSIS — Z9989 Dependence on other enabling machines and devices: Secondary | ICD-10-CM | POA: Diagnosis not present

## 2023-07-12 DIAGNOSIS — Z23 Encounter for immunization: Secondary | ICD-10-CM | POA: Diagnosis not present

## 2023-07-12 DIAGNOSIS — M810 Age-related osteoporosis without current pathological fracture: Secondary | ICD-10-CM

## 2023-07-12 DIAGNOSIS — I739 Peripheral vascular disease, unspecified: Secondary | ICD-10-CM | POA: Diagnosis not present

## 2023-07-12 DIAGNOSIS — E7800 Pure hypercholesterolemia, unspecified: Secondary | ICD-10-CM | POA: Diagnosis not present

## 2023-07-12 DIAGNOSIS — Z9181 History of falling: Secondary | ICD-10-CM | POA: Diagnosis not present

## 2023-07-12 DIAGNOSIS — Z5181 Encounter for therapeutic drug level monitoring: Secondary | ICD-10-CM | POA: Diagnosis not present

## 2023-07-12 DIAGNOSIS — I2510 Atherosclerotic heart disease of native coronary artery without angina pectoris: Secondary | ICD-10-CM | POA: Diagnosis not present

## 2023-08-06 DIAGNOSIS — R300 Dysuria: Secondary | ICD-10-CM | POA: Diagnosis not present

## 2023-08-12 DIAGNOSIS — N3090 Cystitis, unspecified without hematuria: Secondary | ICD-10-CM | POA: Diagnosis not present

## 2023-08-23 DIAGNOSIS — M549 Dorsalgia, unspecified: Secondary | ICD-10-CM | POA: Diagnosis not present

## 2023-08-23 DIAGNOSIS — R2681 Unsteadiness on feet: Secondary | ICD-10-CM | POA: Diagnosis not present

## 2023-09-02 DIAGNOSIS — R2681 Unsteadiness on feet: Secondary | ICD-10-CM | POA: Diagnosis not present

## 2023-09-02 DIAGNOSIS — M549 Dorsalgia, unspecified: Secondary | ICD-10-CM | POA: Diagnosis not present

## 2023-09-09 DIAGNOSIS — R2681 Unsteadiness on feet: Secondary | ICD-10-CM | POA: Diagnosis not present

## 2023-09-09 DIAGNOSIS — M549 Dorsalgia, unspecified: Secondary | ICD-10-CM | POA: Diagnosis not present

## 2023-09-12 DIAGNOSIS — M549 Dorsalgia, unspecified: Secondary | ICD-10-CM | POA: Diagnosis not present

## 2023-09-12 DIAGNOSIS — R2681 Unsteadiness on feet: Secondary | ICD-10-CM | POA: Diagnosis not present

## 2023-09-16 DIAGNOSIS — M549 Dorsalgia, unspecified: Secondary | ICD-10-CM | POA: Diagnosis not present

## 2023-09-16 DIAGNOSIS — R2681 Unsteadiness on feet: Secondary | ICD-10-CM | POA: Diagnosis not present

## 2023-09-19 DIAGNOSIS — M549 Dorsalgia, unspecified: Secondary | ICD-10-CM | POA: Diagnosis not present

## 2023-09-19 DIAGNOSIS — R2681 Unsteadiness on feet: Secondary | ICD-10-CM | POA: Diagnosis not present

## 2023-09-20 DIAGNOSIS — H02831 Dermatochalasis of right upper eyelid: Secondary | ICD-10-CM | POA: Diagnosis not present

## 2023-09-20 DIAGNOSIS — H04123 Dry eye syndrome of bilateral lacrimal glands: Secondary | ICD-10-CM | POA: Diagnosis not present

## 2023-09-20 DIAGNOSIS — H43811 Vitreous degeneration, right eye: Secondary | ICD-10-CM | POA: Diagnosis not present

## 2023-09-20 DIAGNOSIS — H26493 Other secondary cataract, bilateral: Secondary | ICD-10-CM | POA: Diagnosis not present

## 2023-09-20 DIAGNOSIS — H10413 Chronic giant papillary conjunctivitis, bilateral: Secondary | ICD-10-CM | POA: Diagnosis not present

## 2023-09-20 DIAGNOSIS — Z961 Presence of intraocular lens: Secondary | ICD-10-CM | POA: Diagnosis not present

## 2023-09-20 DIAGNOSIS — H02834 Dermatochalasis of left upper eyelid: Secondary | ICD-10-CM | POA: Diagnosis not present

## 2023-09-24 DIAGNOSIS — R2681 Unsteadiness on feet: Secondary | ICD-10-CM | POA: Diagnosis not present

## 2023-09-24 DIAGNOSIS — M549 Dorsalgia, unspecified: Secondary | ICD-10-CM | POA: Diagnosis not present

## 2023-09-25 DIAGNOSIS — M1711 Unilateral primary osteoarthritis, right knee: Secondary | ICD-10-CM | POA: Diagnosis not present

## 2023-09-26 DIAGNOSIS — R2681 Unsteadiness on feet: Secondary | ICD-10-CM | POA: Diagnosis not present

## 2023-09-26 DIAGNOSIS — M549 Dorsalgia, unspecified: Secondary | ICD-10-CM | POA: Diagnosis not present

## 2023-09-30 DIAGNOSIS — R2681 Unsteadiness on feet: Secondary | ICD-10-CM | POA: Diagnosis not present

## 2023-09-30 DIAGNOSIS — M549 Dorsalgia, unspecified: Secondary | ICD-10-CM | POA: Diagnosis not present

## 2023-10-02 DIAGNOSIS — M549 Dorsalgia, unspecified: Secondary | ICD-10-CM | POA: Diagnosis not present

## 2023-10-02 DIAGNOSIS — R2681 Unsteadiness on feet: Secondary | ICD-10-CM | POA: Diagnosis not present

## 2023-10-02 DIAGNOSIS — H903 Sensorineural hearing loss, bilateral: Secondary | ICD-10-CM | POA: Diagnosis not present

## 2023-10-04 DIAGNOSIS — R2681 Unsteadiness on feet: Secondary | ICD-10-CM | POA: Diagnosis not present

## 2023-10-04 DIAGNOSIS — M549 Dorsalgia, unspecified: Secondary | ICD-10-CM | POA: Diagnosis not present

## 2023-10-14 DIAGNOSIS — R2681 Unsteadiness on feet: Secondary | ICD-10-CM | POA: Diagnosis not present

## 2023-10-14 DIAGNOSIS — M549 Dorsalgia, unspecified: Secondary | ICD-10-CM | POA: Diagnosis not present

## 2023-10-18 DIAGNOSIS — M549 Dorsalgia, unspecified: Secondary | ICD-10-CM | POA: Diagnosis not present

## 2023-10-18 DIAGNOSIS — R2681 Unsteadiness on feet: Secondary | ICD-10-CM | POA: Diagnosis not present

## 2023-10-23 DIAGNOSIS — R2681 Unsteadiness on feet: Secondary | ICD-10-CM | POA: Diagnosis not present

## 2023-10-23 DIAGNOSIS — M549 Dorsalgia, unspecified: Secondary | ICD-10-CM | POA: Diagnosis not present

## 2023-10-25 DIAGNOSIS — M549 Dorsalgia, unspecified: Secondary | ICD-10-CM | POA: Diagnosis not present

## 2023-10-25 DIAGNOSIS — R2681 Unsteadiness on feet: Secondary | ICD-10-CM | POA: Diagnosis not present

## 2023-11-11 DIAGNOSIS — R2681 Unsteadiness on feet: Secondary | ICD-10-CM | POA: Diagnosis not present

## 2023-11-11 DIAGNOSIS — M549 Dorsalgia, unspecified: Secondary | ICD-10-CM | POA: Diagnosis not present

## 2023-11-13 DIAGNOSIS — M549 Dorsalgia, unspecified: Secondary | ICD-10-CM | POA: Diagnosis not present

## 2023-11-13 DIAGNOSIS — R2681 Unsteadiness on feet: Secondary | ICD-10-CM | POA: Diagnosis not present

## 2023-11-19 DIAGNOSIS — R2681 Unsteadiness on feet: Secondary | ICD-10-CM | POA: Diagnosis not present

## 2023-11-19 DIAGNOSIS — M549 Dorsalgia, unspecified: Secondary | ICD-10-CM | POA: Diagnosis not present

## 2023-11-26 DIAGNOSIS — M549 Dorsalgia, unspecified: Secondary | ICD-10-CM | POA: Diagnosis not present

## 2023-11-26 DIAGNOSIS — R2681 Unsteadiness on feet: Secondary | ICD-10-CM | POA: Diagnosis not present

## 2023-11-29 DIAGNOSIS — C44722 Squamous cell carcinoma of skin of right lower limb, including hip: Secondary | ICD-10-CM | POA: Diagnosis not present

## 2023-11-29 DIAGNOSIS — L570 Actinic keratosis: Secondary | ICD-10-CM | POA: Diagnosis not present

## 2023-11-29 DIAGNOSIS — D692 Other nonthrombocytopenic purpura: Secondary | ICD-10-CM | POA: Diagnosis not present

## 2023-11-29 DIAGNOSIS — D1801 Hemangioma of skin and subcutaneous tissue: Secondary | ICD-10-CM | POA: Diagnosis not present

## 2023-11-29 DIAGNOSIS — L723 Sebaceous cyst: Secondary | ICD-10-CM | POA: Diagnosis not present

## 2023-11-29 DIAGNOSIS — Z85828 Personal history of other malignant neoplasm of skin: Secondary | ICD-10-CM | POA: Diagnosis not present

## 2023-12-02 DIAGNOSIS — M549 Dorsalgia, unspecified: Secondary | ICD-10-CM | POA: Diagnosis not present

## 2023-12-02 DIAGNOSIS — R2681 Unsteadiness on feet: Secondary | ICD-10-CM | POA: Diagnosis not present

## 2023-12-05 DIAGNOSIS — N281 Cyst of kidney, acquired: Secondary | ICD-10-CM | POA: Diagnosis not present

## 2023-12-05 DIAGNOSIS — Z8551 Personal history of malignant neoplasm of bladder: Secondary | ICD-10-CM | POA: Diagnosis not present

## 2023-12-11 DIAGNOSIS — M549 Dorsalgia, unspecified: Secondary | ICD-10-CM | POA: Diagnosis not present

## 2023-12-11 DIAGNOSIS — R2681 Unsteadiness on feet: Secondary | ICD-10-CM | POA: Diagnosis not present

## 2023-12-18 DIAGNOSIS — R2681 Unsteadiness on feet: Secondary | ICD-10-CM | POA: Diagnosis not present

## 2023-12-18 DIAGNOSIS — M549 Dorsalgia, unspecified: Secondary | ICD-10-CM | POA: Diagnosis not present

## 2024-01-09 DIAGNOSIS — I2510 Atherosclerotic heart disease of native coronary artery without angina pectoris: Secondary | ICD-10-CM | POA: Diagnosis not present

## 2024-01-09 DIAGNOSIS — I739 Peripheral vascular disease, unspecified: Secondary | ICD-10-CM | POA: Diagnosis not present

## 2024-01-09 DIAGNOSIS — Z5181 Encounter for therapeutic drug level monitoring: Secondary | ICD-10-CM | POA: Diagnosis not present

## 2024-01-09 DIAGNOSIS — Z23 Encounter for immunization: Secondary | ICD-10-CM | POA: Diagnosis not present

## 2024-01-09 DIAGNOSIS — M810 Age-related osteoporosis without current pathological fracture: Secondary | ICD-10-CM | POA: Diagnosis not present

## 2024-01-09 DIAGNOSIS — E7800 Pure hypercholesterolemia, unspecified: Secondary | ICD-10-CM | POA: Diagnosis not present

## 2024-01-09 DIAGNOSIS — E039 Hypothyroidism, unspecified: Secondary | ICD-10-CM | POA: Diagnosis not present

## 2024-01-09 DIAGNOSIS — R269 Unspecified abnormalities of gait and mobility: Secondary | ICD-10-CM | POA: Diagnosis not present

## 2024-01-15 DIAGNOSIS — M1711 Unilateral primary osteoarthritis, right knee: Secondary | ICD-10-CM | POA: Diagnosis not present

## 2024-01-28 DIAGNOSIS — R2681 Unsteadiness on feet: Secondary | ICD-10-CM | POA: Diagnosis not present

## 2024-01-28 DIAGNOSIS — R269 Unspecified abnormalities of gait and mobility: Secondary | ICD-10-CM | POA: Diagnosis not present

## 2024-01-30 DIAGNOSIS — R2681 Unsteadiness on feet: Secondary | ICD-10-CM | POA: Diagnosis not present

## 2024-01-30 DIAGNOSIS — R269 Unspecified abnormalities of gait and mobility: Secondary | ICD-10-CM | POA: Diagnosis not present

## 2024-02-03 DIAGNOSIS — R2681 Unsteadiness on feet: Secondary | ICD-10-CM | POA: Diagnosis not present

## 2024-02-03 DIAGNOSIS — R269 Unspecified abnormalities of gait and mobility: Secondary | ICD-10-CM | POA: Diagnosis not present

## 2024-02-06 DIAGNOSIS — R269 Unspecified abnormalities of gait and mobility: Secondary | ICD-10-CM | POA: Diagnosis not present

## 2024-02-06 DIAGNOSIS — R2681 Unsteadiness on feet: Secondary | ICD-10-CM | POA: Diagnosis not present

## 2024-02-07 ENCOUNTER — Telehealth: Payer: Self-pay | Admitting: Cardiology

## 2024-02-07 DIAGNOSIS — E785 Hyperlipidemia, unspecified: Secondary | ICD-10-CM

## 2024-02-07 DIAGNOSIS — I10 Essential (primary) hypertension: Secondary | ICD-10-CM

## 2024-02-07 NOTE — Telephone Encounter (Signed)
 Pt requesting cb to see if she needs to have labs done prior to appt in Aug.

## 2024-02-07 NOTE — Telephone Encounter (Signed)
 Called patient about message. Patient would like to know if Dr. Micael Adas wants lab work before her appointment. Patient stated when we call her back, we can leave a detailed message on her answering machine at this number [phone]. Will forward to Dr. Micael Adas to see what lab work she would like to get prior to patient's office visit in August.

## 2024-02-10 DIAGNOSIS — R269 Unspecified abnormalities of gait and mobility: Secondary | ICD-10-CM | POA: Diagnosis not present

## 2024-02-10 DIAGNOSIS — R2681 Unsteadiness on feet: Secondary | ICD-10-CM | POA: Diagnosis not present

## 2024-02-14 DIAGNOSIS — R269 Unspecified abnormalities of gait and mobility: Secondary | ICD-10-CM | POA: Diagnosis not present

## 2024-02-14 DIAGNOSIS — R2681 Unsteadiness on feet: Secondary | ICD-10-CM | POA: Diagnosis not present

## 2024-02-18 DIAGNOSIS — C44722 Squamous cell carcinoma of skin of right lower limb, including hip: Secondary | ICD-10-CM | POA: Diagnosis not present

## 2024-02-21 DIAGNOSIS — R2681 Unsteadiness on feet: Secondary | ICD-10-CM | POA: Diagnosis not present

## 2024-02-21 DIAGNOSIS — R269 Unspecified abnormalities of gait and mobility: Secondary | ICD-10-CM | POA: Diagnosis not present

## 2024-02-24 DIAGNOSIS — R2681 Unsteadiness on feet: Secondary | ICD-10-CM | POA: Diagnosis not present

## 2024-02-24 DIAGNOSIS — R269 Unspecified abnormalities of gait and mobility: Secondary | ICD-10-CM | POA: Diagnosis not present

## 2024-03-16 DIAGNOSIS — R269 Unspecified abnormalities of gait and mobility: Secondary | ICD-10-CM | POA: Diagnosis not present

## 2024-03-16 DIAGNOSIS — R2681 Unsteadiness on feet: Secondary | ICD-10-CM | POA: Diagnosis not present

## 2024-03-18 DIAGNOSIS — R2681 Unsteadiness on feet: Secondary | ICD-10-CM | POA: Diagnosis not present

## 2024-03-18 DIAGNOSIS — R269 Unspecified abnormalities of gait and mobility: Secondary | ICD-10-CM | POA: Diagnosis not present

## 2024-03-20 DIAGNOSIS — R2681 Unsteadiness on feet: Secondary | ICD-10-CM | POA: Diagnosis not present

## 2024-03-20 DIAGNOSIS — R269 Unspecified abnormalities of gait and mobility: Secondary | ICD-10-CM | POA: Diagnosis not present

## 2024-03-26 ENCOUNTER — Other Ambulatory Visit: Payer: Medicare Other

## 2024-04-01 ENCOUNTER — Ambulatory Visit (HOSPITAL_BASED_OUTPATIENT_CLINIC_OR_DEPARTMENT_OTHER)
Admission: RE | Admit: 2024-04-01 | Discharge: 2024-04-01 | Disposition: A | Discharge status: Home / Self Care | Source: Ambulatory Visit | Attending: Internal Medicine | Admitting: Internal Medicine

## 2024-04-01 DIAGNOSIS — Z780 Asymptomatic menopausal state: Secondary | ICD-10-CM | POA: Diagnosis not present

## 2024-04-01 DIAGNOSIS — M810 Age-related osteoporosis without current pathological fracture: Secondary | ICD-10-CM | POA: Diagnosis not present

## 2024-04-02 DIAGNOSIS — L820 Inflamed seborrheic keratosis: Secondary | ICD-10-CM | POA: Diagnosis not present

## 2024-04-02 DIAGNOSIS — C44722 Squamous cell carcinoma of skin of right lower limb, including hip: Secondary | ICD-10-CM | POA: Diagnosis not present

## 2024-04-06 DIAGNOSIS — R2681 Unsteadiness on feet: Secondary | ICD-10-CM | POA: Diagnosis not present

## 2024-04-06 DIAGNOSIS — R269 Unspecified abnormalities of gait and mobility: Secondary | ICD-10-CM | POA: Diagnosis not present

## 2024-04-08 DIAGNOSIS — R269 Unspecified abnormalities of gait and mobility: Secondary | ICD-10-CM | POA: Diagnosis not present

## 2024-04-08 DIAGNOSIS — R2681 Unsteadiness on feet: Secondary | ICD-10-CM | POA: Diagnosis not present

## 2024-04-13 DIAGNOSIS — R269 Unspecified abnormalities of gait and mobility: Secondary | ICD-10-CM | POA: Diagnosis not present

## 2024-04-13 DIAGNOSIS — R2681 Unsteadiness on feet: Secondary | ICD-10-CM | POA: Diagnosis not present

## 2024-04-15 DIAGNOSIS — R2681 Unsteadiness on feet: Secondary | ICD-10-CM | POA: Diagnosis not present

## 2024-04-15 DIAGNOSIS — R269 Unspecified abnormalities of gait and mobility: Secondary | ICD-10-CM | POA: Diagnosis not present

## 2024-04-16 DIAGNOSIS — R2681 Unsteadiness on feet: Secondary | ICD-10-CM | POA: Diagnosis not present

## 2024-04-16 DIAGNOSIS — R269 Unspecified abnormalities of gait and mobility: Secondary | ICD-10-CM | POA: Diagnosis not present

## 2024-04-17 DIAGNOSIS — M1711 Unilateral primary osteoarthritis, right knee: Secondary | ICD-10-CM | POA: Diagnosis not present

## 2024-04-20 DIAGNOSIS — R269 Unspecified abnormalities of gait and mobility: Secondary | ICD-10-CM | POA: Diagnosis not present

## 2024-04-20 DIAGNOSIS — R2681 Unsteadiness on feet: Secondary | ICD-10-CM | POA: Diagnosis not present

## 2024-04-21 NOTE — Addendum Note (Signed)
 Addended by: VICCI ROXIE CROME on: 04/21/2024 09:48 AM   Modules accepted: Orders

## 2024-04-21 NOTE — Telephone Encounter (Signed)
 Patient stated she has not had lab work done in the last 6 months and wants lab orders sent as she plans to have lab work done on 8/14.

## 2024-04-21 NOTE — Telephone Encounter (Signed)
 Spoke with patient and informed her Dr. Shlomo would like her to have fasting labs to check cholesterol and kidney function. Orders for CMET and FLP placed. Patient will plan to have drawn at [facility] on 8/14. No further needs voiced at this time, patient expressed appreciation for call.

## 2024-04-22 DIAGNOSIS — R2681 Unsteadiness on feet: Secondary | ICD-10-CM | POA: Diagnosis not present

## 2024-04-22 DIAGNOSIS — R269 Unspecified abnormalities of gait and mobility: Secondary | ICD-10-CM | POA: Diagnosis not present

## 2024-04-23 DIAGNOSIS — E785 Hyperlipidemia, unspecified: Secondary | ICD-10-CM | POA: Diagnosis not present

## 2024-04-23 DIAGNOSIS — I10 Essential (primary) hypertension: Secondary | ICD-10-CM | POA: Diagnosis not present

## 2024-04-24 ENCOUNTER — Ambulatory Visit: Payer: Self-pay | Admitting: Cardiology

## 2024-04-24 DIAGNOSIS — R2681 Unsteadiness on feet: Secondary | ICD-10-CM | POA: Diagnosis not present

## 2024-04-24 DIAGNOSIS — R269 Unspecified abnormalities of gait and mobility: Secondary | ICD-10-CM | POA: Diagnosis not present

## 2024-04-24 LAB — LIPID PANEL
Chol/HDL Ratio: 2 ratio (ref 0.0–4.4)
Cholesterol, Total: 196 mg/dL (ref 100–199)
HDL: 97 mg/dL (ref >39)
LDL Chol Calc (NIH): 80 mg/dL (ref 0–99)
Triglycerides: 113 mg/dL (ref 0–149)
VLDL Cholesterol Cal: 19 mg/dL (ref 5–40)

## 2024-04-24 LAB — COMPREHENSIVE METABOLIC PANEL WITH GFR
ALT: 24 IU/L (ref 0–32)
AST: 25 IU/L (ref 0–40)
Albumin: 4.6 g/dL (ref 3.7–4.7)
Alkaline Phosphatase: 81 IU/L (ref 44–121)
BUN/Creatinine Ratio: 26 (ref 12–28)
BUN: 25 mg/dL (ref 8–27)
Bilirubin Total: 0.6 mg/dL (ref 0.0–1.2)
CO2: 21 mmol/L (ref 20–29)
Calcium: 9.3 mg/dL (ref 8.7–10.3)
Chloride: 102 mmol/L (ref 96–106)
Creatinine, Ser: 0.96 mg/dL (ref 0.57–1.00)
Globulin, Total: 1.9 g/dL (ref 1.5–4.5)
Glucose: 91 mg/dL (ref 70–99)
Potassium: 4.6 mmol/L (ref 3.5–5.2)
Sodium: 139 mmol/L (ref 134–144)
Total Protein: 6.5 g/dL (ref 6.0–8.5)
eGFR: 57 mL/min/1.73 — ABNORMAL LOW (ref >59)

## 2024-04-27 ENCOUNTER — Ambulatory Visit: Attending: Cardiology | Admitting: Cardiology

## 2024-04-27 ENCOUNTER — Encounter: Payer: Self-pay | Admitting: Cardiology

## 2024-04-27 VITALS — BP 158/62 | HR 78 | Ht 62.5 in | Wt 149.4 lb

## 2024-04-27 DIAGNOSIS — I2510 Atherosclerotic heart disease of native coronary artery without angina pectoris: Secondary | ICD-10-CM

## 2024-04-27 DIAGNOSIS — R600 Localized edema: Secondary | ICD-10-CM

## 2024-04-27 DIAGNOSIS — I10 Essential (primary) hypertension: Secondary | ICD-10-CM

## 2024-04-27 DIAGNOSIS — E785 Hyperlipidemia, unspecified: Secondary | ICD-10-CM | POA: Diagnosis not present

## 2024-04-27 NOTE — Progress Notes (Signed)
 Date:  04/27/2024   ID:  DESARE [name], DOB [birth year], MRN [id number]   PCP:  Rexanne Ingle, MD  Cardiologist:  Wilbert Bihari, MD Electrophysiologist:  None   Chief Complaint:  CAD, HTN, lipids  History of Present Illness:    [name] is a 89 y.o. female with a hx of ASCAD  s/p PCI of the LAD in 2010, HTN and dyslipidemia.  She is here today for follow-up and is doing well.  She has had a rough year.  She fell last fall suffering an L1 L2 compression fx and then her husband passed away the same week.  She denies any chest pain or pressure, shortness of breath, DOE, PND, orthopnea, lower extremity edema, dizziness, palpitations or syncope.  Prior CV studies:   The following studies were reviewed today:  EKG  Past Medical History:  Diagnosis Date   Anxiety    Arthritis    Bladder cancer (HCC) 2012   Breast cancer (HCC)    CAD (coronary artery disease) 2/10   S/P PCI (Stent) of LAD. Normal LVF-No angina   Cancer of central portion of female breast, right 11/22/2015   Cataract    left eye   Colonic stricture (HCC) 2013   and colon adenoma   Constipation    predominant IBS-miralax and citrucel daily-has used linzess  in the past. Dr Gracie)   Diverticulosis of colon (without mention of hemorrhage)    External hemorrhoids without mention of complication    Hyperlipidemia    IBS (irritable bowel syndrome)    Insomnia    MSSA (methicillin susceptible Staphylococcus aureus) 2011   buttock abscess   Osteopenia    mild   Phlebitis    PONV (postoperative nausea and vomiting)    Shingles    Squamous cell carcinoma, leg 2012   right, left   Thyroid  disease    hypothyroidism   Vitamin D insufficiency    Dr. Mandy   Past Surgical History:  Procedure Laterality Date   Bladder cancer surgery  09/10/2010   Dr Dalhstedt   CORONARY ANGIOPLASTY WITH STENT PLACEMENT  10/11/2008   HEMORRHOID SURGERY     KNEE ARTHROSCOPY Left    Age [age]   Left elbow surgery x4      RADIOACTIVE SEED GUIDED PARTIAL MASTECTOMY WITH AXILLARY SENTINEL LYMPH NODE BIOPSY Right 01/04/2016   Procedure: RADIOACTIVE SEED GUIDED PARTIAL MASTECTOMY WITH AXILLARY SENTINEL LYMPH NODE BIOPSY;  Surgeon: Sally Null III, MD;  Location: [facility] OR;  Service: General;  Laterality: Right;   TONSILLECTOMY     TOTAL KNEE ARTHROPLASTY  06/10/2000   Left     Current Meds  Medication Sig   acetaminophen  (TYLENOL ) 500 MG tablet Take 500 mg by mouth.   ALPRAZolam  (XANAX ) 0.5 MG tablet Take 0.25 mg by mouth daily as needed for sleep. Reported on 02/22/2016   ascorbic acid (VITAMIN C) 500 MG tablet Take 1 tablet by mouth daily.   aspirin  EC 81 MG tablet Take 81 mg by mouth daily.   B Complex Vitamins (B COMPLEX PO) Take by mouth.   hydrocortisone  2.5 % cream Apply topically.   levothyroxine  (SYNTHROID , LEVOTHROID) 50 MCG tablet Take 50 mcg by mouth daily.   Magnesium Hydroxide (MAGNESIA PO) Take 1 tablet by mouth daily. Pt takes 400 mg   polyethylene glycol (MIRALAX / GLYCOLAX) 17 g packet Take 17 g by mouth daily.   pravastatin  (PRAVACHOL ) 20 MG tablet Take 1 tablet (20 mg total) by mouth daily.  VITAMIN D PO Take by mouth.     Allergies:   Benadryl [diphenhydramine hcl], Crestor [rosuvastatin], Lipitor [atorvastatin], Morphine and codeine, Tramadol, Keflex [cephalexin], Adhesive [tape], Cortisone, and Sulfonamide derivatives   Social History   Tobacco Use   Smoking status: Former    Current packs/day: 0.00    Types: Cigarettes    Quit date: 09/10/1984    Years since quitting: 39.6   Smokeless tobacco: Never   Tobacco comments:    Quit tobacco 30 years ago.  Vaping Use   Vaping status: Never Used  Substance Use Topics   Alcohol use: Yes    Alcohol/week: 7.0 standard drinks of alcohol    Types: 7 Glasses of wine per week    Comment: wine   Drug use: No     Family Hx: The patient's family history includes Alzheimer's disease in her mother; Arthritis in her mother and sister; Breast  cancer in an other family member; Breast cancer (age of onset: 50) in her sister; Coronary artery disease (age of onset: 74) in her son; Coronary artery disease (age of onset: 41) in her paternal uncle; Diabetes in her maternal grandmother; Heart attack in her father and maternal grandfather; Heart disease (age of onset: 67) in her father; Hiatal hernia in her sister; Hypertension in her brother; Lung cancer in her brother; Stroke in her sister and son; Ulcers in her mother. There is no history of Colon cancer, Esophageal cancer, Rectal cancer, or Stomach cancer.  ROS:   Please see the history of present illness.     All other systems reviewed and are negative.   Labs/Other Tests and Data Reviewed:    EKG Interpretation Date/Time:  Monday April 27 2024 15:44:11 EDT Ventricular Rate:  78 PR Interval:  178 QRS Duration:  78 QT Interval:  366 QTC Calculation: 417 R Axis:   12  Text Interpretation: Normal sinus rhythm Possible Anterior infarct , age undetermined When compared with ECG of 12-Apr-2023 10:32, Borderline criteria for Anterior infarct are now Present Confirmed by Shlomo Corning [phone]) on 04/27/2024 3:47:56 PMEKG Interpretation Date/Time:  Monday April 27 2024 15:44:11 EDT Ventricular Rate:  78 PR Interval:  178 QRS Duration:  78 QT Interval:  366 QTC Calculation: 417 R Axis:   12  Text Interpretation: Normal sinus rhythm Possible Anterior infarct , age undetermined When compared with ECG of 12-Apr-2023 10:32, Borderline criteria for Anterior infarct are now Present Confirmed by Shlomo Corning [phone]) on 04/27/2024 3:47:56 PM    Recent Labs: 04/23/2024: ALT 24; BUN 25; Creatinine, Ser 0.96; Potassium 4.6; Sodium 139   Recent Lipid Panel Lab Results  Component Value Date/Time   CHOL 196 04/23/2024 08:44 AM   CHOL 164 06/21/2014 08:29 AM   TRIG 113 04/23/2024 08:44 AM   TRIG 115 06/21/2014 08:29 AM   HDL 97 04/23/2024 08:44 AM   HDL 78 06/21/2014 08:29 AM   CHOLHDL 2.0  04/23/2024 08:44 AM   CHOLHDL 1.8 01/25/2016 08:26 AM   LDLCALC 80 04/23/2024 08:44 AM   LDLCALC 63 06/21/2014 08:29 AM    Wt Readings from Last 3 Encounters:  04/27/24 149 lb 6.4 oz (67.8 kg)  06/27/23 160 lb 0.9 oz (72.6 kg)  04/12/23 160 lb (72.6 kg)     Objective:    Vital Signs:  BP (!) 158/62   Pulse 78   Ht 5' 2.5 (1.588 m)   Wt 149 lb 6.4 oz (67.8 kg)   SpO2 98%   BMI 26.89 kg/m   GEN:  Well nourished, well developed in no acute distress HEENT: Normal NECK: No JVD; No carotid bruits LYMPHATICS: No lymphadenopathy CARDIAC:RRR, no murmurs, rubs, gallops RESPIRATORY:  Clear to auscultation without rales, wheezing or rhonchi  ABDOMEN: Soft, non-tender, non-distended MUSCULOSKELETAL:  1+ BLE edema with chronic venous stasis changes and excoriations; No deformity  SKIN: Warm and dry NEUROLOGIC:  Alert and oriented x 3 PSYCHIATRIC:  Normal affect  ASSESSMENT & PLAN:    1.  ASCAD  - s/p PCI of the LAD in 2010.   -She denies any recent episodes of angina - Continue aspirin  81 mg daily, pravastatin  20 mg daily, Zetia  10 mg daily with as needed refills  2.  Hypertension  -She does have known white coat hypertension -She has not needed any antihypertensive medications -I have asked her to check her BP daily at lunch x 1 week and call with results  3.  Hyperlipidemia  -Her LDL goal is < 70.  -I have personally reviewed and interpreted outside labs performed by patient's PCP which showed LDL 81, HDL 88, triglycerides 126, ALT 14 on 01/09/2024 -she aches partly from the statin and does not want to increase the dose -given her advanced age I will not push the statin any further -continue pravastatin  20 mg daily and Zetia  10 mg daily with as needed refills  4.  LE edema  -she has chronic LE edema from a knee replacement and dietary indiscretion with Na due to eating food prepared at assisted living -Her LE venous doppler negative in the past   -continue Lasix  20mg  PRN   -cannot use compression hose because it tears her skin -she tries to keep her legs elevated -I will get a copy of her last BMP from her PCP  Medication Adjustments/Labs and Tests Ordered: Current medicines are reviewed at length with the patient today.  Concerns regarding medicines are outlined above.  Tests Ordered: Orders Placed This Encounter  Procedures   EKG 12-Lead   Medication Changes: No orders of the defined types were placed in this encounter.   Disposition:  Follow up in 1 year(s)  Signed, Wilbert Bihari, MD  04/27/2024 4:03 PM    [facility]

## 2024-04-27 NOTE — Patient Instructions (Signed)
 Medication Instructions:  Your physician recommends that you continue on your current medications as directed. Please refer to the Current Medication list given to you today.  *If you need a refill on your cardiac medications before your next appointment, please call your pharmacy*  Lab Work: Please ask your PCP to fax your recent blood work results to our office at [phone].  If you have labs (blood work) drawn today and your tests are completely normal, you will receive your results only by: MyChart Message (if you have MyChart) OR A paper copy in the mail If you have any lab test that is abnormal or we need to change your treatment, we will call you to review the results.  Testing/Procedures: None.  Follow-Up: At [facility], you and your health needs are our priority.  As part of our continuing mission to provide you with exceptional heart care, our providers are all part of one team.  This team includes your primary Cardiologist (physician) and Advanced Practice Providers or APPs (Physician Assistants and Nurse Practitioners) who all work together to provide you with the care you need, when you need it.  Your next appointment:   1 year(s)  Provider:   Wilbert Bihari, MD    We recommend signing up for the patient portal called MyChart.  Sign up information is provided on this After Visit Summary.  MyChart is used to connect with patients for Virtual Visits (Telemedicine).  Patients are able to view lab/test results, encounter notes, upcoming appointments, etc.  Non-urgent messages can be sent to your provider as well.   To learn more about what you can do with MyChart, go to [email or web address].   Other Instructions Please track your blood pressures for one week. Check your  blood pressure at lunch and at dinner, write down the date/time of the reading as well as the heart rate if your machine provides one. Then, call our office to give the readings over the phone  or send the readings over MyChart. You can also drop off your readings at the front desk.

## 2024-05-05 ENCOUNTER — Telehealth: Payer: Self-pay | Admitting: Cardiology

## 2024-05-05 DIAGNOSIS — M549 Dorsalgia, unspecified: Secondary | ICD-10-CM | POA: Diagnosis not present

## 2024-05-05 DIAGNOSIS — M546 Pain in thoracic spine: Secondary | ICD-10-CM | POA: Diagnosis not present

## 2024-05-05 DIAGNOSIS — M5134 Other intervertebral disc degeneration, thoracic region: Secondary | ICD-10-CM | POA: Diagnosis not present

## 2024-05-05 DIAGNOSIS — M79606 Pain in leg, unspecified: Secondary | ICD-10-CM | POA: Diagnosis not present

## 2024-05-05 DIAGNOSIS — M4184 Other forms of scoliosis, thoracic region: Secondary | ICD-10-CM | POA: Diagnosis not present

## 2024-05-05 DIAGNOSIS — I10 Essential (primary) hypertension: Secondary | ICD-10-CM | POA: Diagnosis not present

## 2024-05-05 NOTE — Telephone Encounter (Signed)
 Will forward to Dr Shlomo and Geni Sar, RN for their knowledge.

## 2024-05-05 NOTE — Telephone Encounter (Signed)
 Pt called in to inform Dr. Shlomo and Geni, RN that her BP machine went out and she has ordered a new one that will not be in until 9/8. She wanted to let them know because she was told to report her bp back in a week.

## 2024-05-07 NOTE — Telephone Encounter (Signed)
-----   Message from Wilbert Bihari sent at 04/24/2024  4:33 PM EDT ----- LDL not at goal - has she missed any doses of statin or zetia  ----- Message ----- From: Interface, [facility] Lab Results In Sent: 04/24/2024   1:35 AM EDT To: Wilbert JONELLE Bihari, MD

## 2024-05-07 NOTE — Telephone Encounter (Signed)
 Call to patient to advise LDL not at goal - Dr. Shlomo asking for additional feedback about whether she has missed any doses of statin or zetia . Patient states she has not missed any doses and requests further  advice from Dr. Shlomo about what she can do to lower her LDL.

## 2024-05-12 NOTE — Telephone Encounter (Signed)
-----   Message from Wilbert Bihari sent at 05/08/2024 12:53 PM EDT ----- Actually given her advanced age lets keep her pravachol  at current dose of 20mg  daily and have her cut back on fried foods and red meat ----- Message ----- From: Janit Geni CROME, RN Sent: 05/07/2024  11:17 AM EDT To: Wilbert JONELLE Bihari, MD  ----- Message from Geni CROME Janit, RN sent at 05/07/2024 11:17 AM EDT -----   ----- Message ----- From: Bihari Wilbert JONELLE, MD Sent: 04/24/2024   4:33 PM EDT To: Geni CROME Janit, RN  LDL not at goal - has she missed any doses of statin or zetia  ----- Message ----- From: Interface, [facility] Lab Results In Sent: 04/24/2024   1:35 AM EDT To: Wilbert JONELLE Bihari, MD

## 2024-05-12 NOTE — Telephone Encounter (Signed)
 Call to patient to advise that given her advanced age Dr. Shlomo recommends she keep her pravachol  at current dose of 20mg  daily and have her cut back on fried foods and red meat. Patient verbalizes understanding and agrees to plan.

## 2024-05-25 DIAGNOSIS — R8290 Unspecified abnormal findings in urine: Secondary | ICD-10-CM | POA: Diagnosis not present

## 2024-05-25 DIAGNOSIS — Z8551 Personal history of malignant neoplasm of bladder: Secondary | ICD-10-CM | POA: Diagnosis not present

## 2024-05-25 DIAGNOSIS — R300 Dysuria: Secondary | ICD-10-CM | POA: Diagnosis not present

## 2024-06-08 ENCOUNTER — Telehealth: Payer: Self-pay | Admitting: Cardiology

## 2024-06-08 NOTE — Telephone Encounter (Signed)
 Spoke with pt, aware will forward to dr turner to review

## 2024-06-08 NOTE — Telephone Encounter (Signed)
 Pt c/o BP issue: STAT if pt c/o blurred vision, one-sided weakness or slurred speech.  STAT if BP is GREATER than 180/120 TODAY.  STAT if BP is LESS than 90/60 and SYMPTOMATIC TODAY  1. What is your BP concern? Submitting bp reading   2. Have you taken any BP medication today? Per pt don't have bp meds   3. What are your last 5 BP readings? 9/11 142/70, 9/12 150/72, 9/13 150/70 9/14 124/70, 9/15 144/77, 9/16 147/82, 9/17 140/78   4. Are you having any other symptoms (ex. Dizziness, headache, blurred vision, passed out)? No  Pt would like a call back

## 2024-06-09 NOTE — Telephone Encounter (Signed)
 Spoke with patient and shared Dr. Dorine response:  BP is removed and the rest of her blood pressures a week ago were fine. I would like her to keep check on her blood pressure twice daily for the next week and send in the results. Please remind her to follow a less than 2 g sodium diet    Patient states she mailed in her readings but they were returned to her. Verified our mailing address as: [address], [place], [place] [address]  Patient verbalized understanding of the above and expressed appreciation for call.

## 2024-06-15 ENCOUNTER — Other Ambulatory Visit: Payer: Self-pay | Admitting: Physician Assistant

## 2024-06-16 DIAGNOSIS — E039 Hypothyroidism, unspecified: Secondary | ICD-10-CM | POA: Diagnosis not present

## 2024-06-16 DIAGNOSIS — M810 Age-related osteoporosis without current pathological fracture: Secondary | ICD-10-CM | POA: Diagnosis not present

## 2024-06-16 DIAGNOSIS — R269 Unspecified abnormalities of gait and mobility: Secondary | ICD-10-CM | POA: Diagnosis not present

## 2024-06-16 DIAGNOSIS — Z5181 Encounter for therapeutic drug level monitoring: Secondary | ICD-10-CM | POA: Diagnosis not present

## 2024-06-16 DIAGNOSIS — E7800 Pure hypercholesterolemia, unspecified: Secondary | ICD-10-CM | POA: Diagnosis not present

## 2024-06-16 DIAGNOSIS — I10 Essential (primary) hypertension: Secondary | ICD-10-CM | POA: Diagnosis not present

## 2024-06-16 DIAGNOSIS — Z23 Encounter for immunization: Secondary | ICD-10-CM | POA: Diagnosis not present

## 2024-06-18 ENCOUNTER — Telehealth: Payer: Self-pay | Admitting: Cardiology

## 2024-06-18 MED ORDER — PRAVASTATIN SODIUM 20 MG PO TABS: 20.0000 mg | ORAL_TABLET | Freq: Every day | ORAL | 3 refills | Status: AC

## 2024-06-18 NOTE — Telephone Encounter (Signed)
*  STAT* If patient is at the pharmacy, call can be transferred to refill team.   1. Which medications need to be refilled? (please list name of each medication and dose if known) pravastatin  (PRAVACHOL ) 20 MG tablet    2. Would you like to learn more about the convenience, safety, & potential cost savings by using the [facility]? No   3. Are you open to using the [facility] (Type [facility]. No   4. Which pharmacy/location (including street and city if local pharmacy) is medication to be sent to? [facility] [place], [place] - [address]     5. Do they need a 30 day or 90 day supply? 90 day

## 2024-06-18 NOTE — Telephone Encounter (Signed)
 RX sent in

## 2024-06-22 ENCOUNTER — Telehealth: Payer: Self-pay | Admitting: Cardiology

## 2024-06-22 NOTE — Telephone Encounter (Signed)
 Pt was advised to turn in these readings by Dr Shlomo and would like a c/b from the nurse    140/68, 142/66  137/65, 149/80 157/92 (traveling)  145/79, 146/72 142/70,0142/72 142/70, 137/69 142/66   Please Advise

## 2024-06-24 MED ORDER — METOPROLOL SUCCINATE ER 25 MG PO TB24: 25.0000 mg | ORAL_TABLET | Freq: Every day | ORAL | 1 refills | Status: DC

## 2024-06-24 NOTE — Telephone Encounter (Signed)
 Pt advised of Dr Dorine recommendation as below.  Pt requests Metoprolol  be sent to [facility].  Pt states she will send BP and HR readings next week.

## 2024-06-25 DIAGNOSIS — L570 Actinic keratosis: Secondary | ICD-10-CM | POA: Diagnosis not present

## 2024-06-25 DIAGNOSIS — C44722 Squamous cell carcinoma of skin of right lower limb, including hip: Secondary | ICD-10-CM | POA: Diagnosis not present

## 2024-06-29 ENCOUNTER — Other Ambulatory Visit: Payer: Self-pay | Admitting: Physician Assistant

## 2024-07-02 ENCOUNTER — Telehealth: Payer: Self-pay | Admitting: Cardiology

## 2024-07-02 MED ORDER — EZETIMIBE 10 MG PO TABS: 10.0000 mg | ORAL_TABLET | Freq: Every day | ORAL | 2 refills | Status: AC

## 2024-07-02 NOTE — Telephone Encounter (Signed)
*  STAT* If patient is at the pharmacy, call can be transferred to refill team.   1. Which medications need to be refilled? (please list name of each medication and dose if known) ezetimibe  (ZETIA ) 10 MG tablet   2. Which pharmacy/location (including street and city if local pharmacy) is medication to be sent to? [facility] - [place], [place] - [address]   3. Do they need a 30 day or 90 day supply? 90

## 2024-07-02 NOTE — Telephone Encounter (Signed)
 RX sent in

## 2024-07-06 ENCOUNTER — Telehealth: Payer: Self-pay | Admitting: Cardiology

## 2024-07-06 NOTE — Telephone Encounter (Signed)
  Patient was asked to record her bp readings for a week:  BP readings:  Day 1 146/74, 159/73 Day 2 143/72, 135/76 Day 3 146/73, 152/78 Day 4 148/72, 151/70 Day 5 144/72, 139/72 Day 6 144/70, 131/71 Day 7 142/72, 139/71

## 2024-07-16 NOTE — Telephone Encounter (Signed)
 Patient reports that she had already been on Toprol  XL 25 mg for a week when she sent in the most recent BP readings on 07/06/24.

## 2024-07-16 NOTE — Telephone Encounter (Signed)
 Call to patient to discuss Dr. Dorine recommendations. Left detailed message per DPR advising patient's BP is too high and Dr. Shlomo recommends she start a medication called Toprol -XL 25 mg daily. Asked patient to respond to [facility] message or call us  and let us  know what pharmacy to send her script to.

## 2024-07-23 NOTE — Telephone Encounter (Signed)
 Call to patient to ask if she has more recent readings. Patient states she thought she took some, but cannot find them now and thinks she may have accidentally thrown them away. She denies any headache, blurry vision, weakness or fatigue at this time. Patient does confirm she has been taking her toprol  faithfully everyday since 07/06/24. Advised patient to take BP readings for another week and then send to us  on 07/30/24 so we can determine if she needs dose adjustment of toprol .

## 2024-07-25 ENCOUNTER — Emergency Department (HOSPITAL_COMMUNITY)
Admission: EM | Admit: 2024-07-25 | Discharge: 2024-07-25 | Disposition: A | Discharge status: Home / Self Care | Attending: Emergency Medicine | Admitting: Emergency Medicine

## 2024-07-25 DIAGNOSIS — Z79899 Other long term (current) drug therapy: Secondary | ICD-10-CM | POA: Insufficient documentation

## 2024-07-25 DIAGNOSIS — I10 Essential (primary) hypertension: Secondary | ICD-10-CM | POA: Insufficient documentation

## 2024-07-25 DIAGNOSIS — Z7982 Long term (current) use of aspirin: Secondary | ICD-10-CM | POA: Insufficient documentation

## 2024-07-25 DIAGNOSIS — M7989 Other specified soft tissue disorders: Secondary | ICD-10-CM | POA: Diagnosis present

## 2024-07-25 DIAGNOSIS — I83899 Varicose veins of unspecified lower extremities with other complications: Secondary | ICD-10-CM | POA: Insufficient documentation

## 2024-07-25 LAB — COMPREHENSIVE METABOLIC PANEL WITH GFR
ALT: 18 U/L (ref 0–44)
AST: 21 U/L (ref 15–41)
Albumin: 3.9 g/dL (ref 3.5–5.0)
Alkaline Phosphatase: 56 U/L (ref 38–126)
Anion gap: 9 (ref 5–15)
BUN: 24 mg/dL — ABNORMAL HIGH (ref 8–23)
CO2: 22 mmol/L (ref 22–32)
Calcium: 9.1 mg/dL (ref 8.9–10.3)
Chloride: 107 mmol/L (ref 98–111)
Creatinine, Ser: 0.81 mg/dL (ref 0.44–1.00)
GFR, Estimated: >60 mL/min (ref >60)
Glucose, Bld: 105 mg/dL — ABNORMAL HIGH (ref 70–99)
Potassium: 4 mmol/L (ref 3.5–5.1)
Sodium: 138 mmol/L (ref 135–145)
Total Bilirubin: 0.8 mg/dL (ref 0.0–1.2)
Total Protein: 6.4 g/dL — ABNORMAL LOW (ref 6.5–8.1)

## 2024-07-25 LAB — CBC WITH DIFFERENTIAL/PLATELET
Abs Immature Granulocytes: 0.04 K/uL (ref 0.00–0.07)
Basophils Absolute: 0.1 K/uL (ref 0.0–0.1)
Basophils Relative: 1 %
Eosinophils Absolute: 0.2 K/uL (ref 0.0–0.5)
Eosinophils Relative: 2 %
HCT: 39.1 % (ref 36.0–46.0)
Hemoglobin: 12.8 g/dL (ref 12.0–15.0)
Immature Granulocytes: 1 %
Lymphocytes Relative: 23 %
Lymphs Abs: 1.7 K/uL (ref 0.7–4.0)
MCH: 33.2 pg (ref 26.0–34.0)
MCHC: 32.7 g/dL (ref 30.0–36.0)
MCV: 101.3 fL — ABNORMAL HIGH (ref 80.0–100.0)
Monocytes Absolute: 0.7 K/uL (ref 0.1–1.0)
Monocytes Relative: 10 %
Neutro Abs: 4.6 K/uL (ref 1.7–7.7)
Neutrophils Relative %: 63 %
Platelets: 215 K/uL (ref 150–400)
RBC: 3.86 MIL/uL — ABNORMAL LOW (ref 3.87–5.11)
RDW: 12.5 % (ref 11.5–15.5)
WBC: 7.3 K/uL (ref 4.0–10.5)
nRBC: 0 % (ref 0.0–0.2)

## 2024-07-25 LAB — PROTIME-INR
INR: 0.9 (ref 0.8–1.2)
Prothrombin Time: 13 s (ref 11.4–15.2)

## 2024-07-25 MED ORDER — METOPROLOL SUCCINATE ER 25 MG PO TB24
25.0000 mg | ORAL_TABLET | Freq: Once | ORAL | Status: AC
  Administered 2024-07-25: 25 mg via ORAL
  Filled 2024-07-25: qty 1

## 2024-07-25 MED ORDER — METOPROLOL SUCCINATE ER 25 MG PO TB24: 37.5000 mg | ORAL_TABLET | Freq: Every day | ORAL | Status: DC

## 2024-07-25 MED ORDER — METOPROLOL TARTRATE 25 MG PO TABS
12.5000 mg | ORAL_TABLET | Freq: Once | ORAL | Status: AC
  Administered 2024-07-25: 12.5 mg via ORAL
  Filled 2024-07-25: qty 1

## 2024-07-25 NOTE — ED Provider Notes (Signed)
 [place] [facility] AT [facility] Provider Note   CSN: [id number] Arrival date & time: 07/25/24  0122     Patient presents with: Hypertension   [name] is a 89 y.o. female.  {Add pertinent medical, surgical, social history, OB history to HPI:32947} The patient is an elderly female who presents with a chief complaint of significant bleeding from a small vessel rupture on her leg. The bleeding began around 8:45 PM and persisted despite her efforts to control it with pressure. She resides in a retirement community and sought assistance from a nurse at 9:30 PM. The patient reports having extremely sensitive skin, which bruises easily, but denies being on any blood thinners or anticoagulant medications such as Plavix, Xarelto, or Eliquis. She estimates a considerable amount of blood loss during the episode. The bleeding has since stopped, and a clot has formed at the site. The patient has a history of high blood pressure, which was elevated during the bleeding episode but has since decreased. History was obtained from the patient.   Hypertension       Prior to Admission medications   Medication Sig Start Date End Date Taking? Authorizing Provider  acetaminophen  (TYLENOL ) 500 MG tablet Take 500 mg by mouth.    [provider]  ALPRAZolam  (XANAX ) 0.5 MG tablet Take 0.25 mg by mouth daily as needed for sleep. Reported on 02/22/2016 03/31/15   [provider]  ascorbic acid (VITAMIN C) 500 MG tablet Take 1 tablet by mouth daily. 06/20/20   [provider]  aspirin  EC 81 MG tablet Take 81 mg by mouth daily.    [provider]  B Complex Vitamins (B COMPLEX PO) Take by mouth.    [provider]  ezetimibe  (ZETIA ) 10 MG tablet Take 1 tablet (10 mg total) by mouth daily. 07/02/24   Shlomo Wilbert SAUNDERS, MD  hydrocortisone  2.5 % cream Apply topically. 10/05/20   [provider]  levothyroxine  (SYNTHROID , LEVOTHROID) 50 MCG  tablet Take 50 mcg by mouth daily.    [provider]  Magnesium Hydroxide (MAGNESIA PO) Take 1 tablet by mouth daily. Pt takes 400 mg    [provider]  Melatonin 10 MG TABS as directed Orally    [provider]  metoprolol  succinate (TOPROL  XL) 25 MG 24 hr tablet Take 1 tablet (25 mg total) by mouth daily. 06/24/24   Shlomo Wilbert SAUNDERS, MD  polyethylene glycol (MIRALAX / GLYCOLAX) 17 g packet Take 17 g by mouth daily.    [provider]  pravastatin  (PRAVACHOL ) 20 MG tablet Take 1 tablet (20 mg total) by mouth daily. 06/18/24   Shlomo Wilbert SAUNDERS, MD  VITAMIN D PO Take by mouth.    [provider]    Allergies: Benadryl [diphenhydramine hcl], Crestor [rosuvastatin], Lipitor [atorvastatin], Morphine and codeine, Tramadol, Keflex [cephalexin], Adhesive [tape], Cortisone, and Sulfonamide derivatives    Review of Systems  Updated Vital Signs BP (!) 152/78   Pulse 79   Temp 97.7 F (36.5 C) (Oral)   Resp 17   SpO2 99%   Physical Exam Vitals and nursing note reviewed.  Constitutional:      Appearance: She is well-developed.  HENT:     Head: Normocephalic and atraumatic.  Cardiovascular:     Rate and Rhythm: Normal rate and regular rhythm.  Pulmonary:     Effort: No respiratory distress.     Breath sounds: No stridor.  Abdominal:     General: There is no  distension.  Musculoskeletal:     Cervical back: Normal range of motion.  Skin:    Comments: No bleeding from varicose vein through the bandage. NVI distally.   Neurological:     Mental Status: She is alert.     (all labs ordered are listed, but only abnormal results are displayed) Labs Reviewed  CBC WITH DIFFERENTIAL/PLATELET - Abnormal; Notable for the following components:      Result Value   RBC 3.86 (*)    MCV 101.3 (*)    All other components within normal limits  COMPREHENSIVE METABOLIC PANEL WITH GFR - Abnormal; Notable for the following components:   Glucose, Bld 105 (*)     BUN 24 (*)    Total Protein 6.4 (*)    All other components within normal limits  PROTIME-INR    EKG: None  Radiology: No results found.  {Document cardiac monitor, telemetry assessment procedure when appropriate:32947} Procedures   Medications Ordered in the ED  metoprolol  tartrate (LOPRESSOR ) tablet 12.5 mg (12.5 mg Oral Given 07/25/24 0433)      {Click here for ABCD2, HEART and other calculators REFRESH Note before signing:1}                              Medical Decision Making Amount and/or Complexity of Data Reviewed Labs: ordered.  Risk Prescription drug management.    The patient presented to the emergency department with significant bleeding from a ruptured vessel, which they were unable to stop on their own. The bleeding began around 8:45 PM, and despite efforts to control it, the patient required assistance from a nurse in their retirement community. The patient's blood pressure was noted to be exceedingly high, which complicated the bleeding control. The bleeding has since stopped, and the patient is not on any blood thinners. CBC/CMP/INR were ordered to ensure no coagulopathy, and the decision was made to avoid disturbing the clot by not removing the dressing. The patient was advised to keep the dressing on for a couple of days and to remove it carefully with water or saline to prevent re-bleeding.   Final diagnoses:  None    ED Discharge Orders     None

## 2024-07-25 NOTE — ED Triage Notes (Signed)
 Pt coming from living facility. Had a verucose vein rupture earlier in the day, nursing staff checked BP and noticed it high. (200s/100s) pt new to [facility] just prescribed metoprolol . Took this morning. Last EMS BP 232/124 pt denies any pain

## 2024-07-25 NOTE — Discharge Instructions (Signed)
 Please increase your metoprolol  dose to 1.5 tablets daily.   Keep track of your blood pressures and if you are consistently less than 110 on the top number or light headed, weak, feeling like you are going to pass out then reduce back to your normal dose.   Follow up with Dr. Shlomo for further management.

## 2024-07-25 NOTE — ED Notes (Addendum)
 Patient verbalizes understanding of discharge instructions. Opportunity for questioning and answers were provided. Pt discharged from ED with transport from [facility].

## 2024-08-03 ENCOUNTER — Telehealth: Payer: Self-pay | Admitting: Cardiology

## 2024-08-03 NOTE — Telephone Encounter (Signed)
 Pt call with BP readings requested via Dr. Shlomo.   11/16: 135/77 11/17: 144/76 11/18:142/77 11/19: 147/74 11/20: 149/74 11/21: 144/74 11/22: 140/70 11/23: 135/72  Pt also stated she was in the ED 11/15 for Hypertension and Metoprolol  was increased from 25 mg to 37.5 mg, prior to the readings above. Pt asymptomatic and no complaints at this time. Will send these results to Dr Shlomo and update pt with any changes. Pt verbalized understanding.

## 2024-08-03 NOTE — Telephone Encounter (Signed)
 Call to patient to see if she still has her BP log and if she has HR readings she can provide. No answer, left detailed message per [facility] asking patient to send HR readings over [facility] or call our office to provide.

## 2024-08-03 NOTE — Telephone Encounter (Signed)
 Patient is requesting to speak with a nurse to go over her BP readings from last week per Dr. Dorine request. Please advise.

## 2024-08-04 NOTE — Telephone Encounter (Signed)
 Spoke with patient regarding her medication change-Toprol  XL 50mg  daily. Pt also advised to check her BP and HR twice daily. Pt verbalizes understanding and will call back in a week with readings. Also advised pt if anything changes between now and then to let us  know.  [place], RN [facility] Triage

## 2024-08-04 NOTE — Telephone Encounter (Signed)
 Spoke with pt regarding heart rate reading requests. Pt states she did not record these readings when checking her BP, but all of them were in 60-70's. Pt states she has had no headaches or lightheadedness, asymptomatic. Will send this to MD and nurse for any further recommendations or requests. Pt verbalizes understanding of update.

## 2024-08-14 ENCOUNTER — Telehealth: Payer: Self-pay | Admitting: Cardiology

## 2024-08-14 NOTE — Telephone Encounter (Signed)
 Patient is requesting to speak with a nurse to go over her recent BP readings per Dr. Dorine request. Please advise.

## 2024-08-14 NOTE — Telephone Encounter (Signed)
 Spoke with patient. Per Dr. Shlomo: BPs look great - continue current therapy  Pt verbalized understanding.

## 2024-08-14 NOTE — Telephone Encounter (Signed)
 Pt calling with BP readings , per request.  11/28: 133/75 HR 64 145/73 85 11/29: 135/74 68 145/73 68 11/30: 135/66 65 129/65 72 12/1: 131/68 67 135/65 72 12/2: 142/72 69 137/74 67 12/3: 136/66 78 148/72 72 12/4: 139/69 68 134/68 71  Pt asymptomatic. No complaints at this time. Will forward to Dr. Shlomo

## 2024-08-24 ENCOUNTER — Telehealth: Payer: Self-pay | Admitting: Cardiology

## 2024-08-24 MED ORDER — METOPROLOL SUCCINATE ER 25 MG PO TB24: 50.0000 mg | ORAL_TABLET | Freq: Every day | ORAL | 2 refills | Status: AC

## 2024-08-24 NOTE — Telephone Encounter (Signed)
°  Pt c/o medication issue:  1. Name of Medication: metoprolol  succinate (TOPROL  XL) 25 MG 24 hr tablet   2. How are you currently taking this medication (dosage and times per day)?    She has been taking 2 daily since Dr Shlomo changed her med to 50 mg  3. Are you having a reaction (difficulty breathing--STAT)? na  4. What is your medication issue? Patient needs to get a refill but needs a new prescription written for the 50 mg dosage. Please send to [facility] - [place], [place] - [address]
